# Patient Record
Sex: Female | Born: 1955 | Race: White | Hispanic: No | Marital: Married | Smoking: Former smoker
Health system: Southern US, Community
[De-identification: ages and names within clinical notes are randomized; demographics above are authoritative.]

## PROBLEM LIST (undated history)

## (undated) DIAGNOSIS — N189 Chronic kidney disease, unspecified: Secondary | ICD-10-CM

## (undated) DIAGNOSIS — I350 Nonrheumatic aortic (valve) stenosis: Secondary | ICD-10-CM

## (undated) DIAGNOSIS — I10 Essential (primary) hypertension: Secondary | ICD-10-CM

## (undated) DIAGNOSIS — R011 Cardiac murmur, unspecified: Secondary | ICD-10-CM

## (undated) DIAGNOSIS — K5732 Diverticulitis of large intestine without perforation or abscess without bleeding: Secondary | ICD-10-CM

## (undated) DIAGNOSIS — I639 Cerebral infarction, unspecified: Secondary | ICD-10-CM

## (undated) DIAGNOSIS — K219 Gastro-esophageal reflux disease without esophagitis: Secondary | ICD-10-CM

## (undated) DIAGNOSIS — C649 Malignant neoplasm of unspecified kidney, except renal pelvis: Secondary | ICD-10-CM

## (undated) DIAGNOSIS — I77810 Thoracic aortic ectasia: Secondary | ICD-10-CM

## (undated) DIAGNOSIS — R918 Other nonspecific abnormal finding of lung field: Secondary | ICD-10-CM

## (undated) DIAGNOSIS — Z952 Presence of prosthetic heart valve: Secondary | ICD-10-CM

## (undated) HISTORY — DX: Chronic kidney disease, unspecified: N18.9

## (undated) HISTORY — DX: Essential (primary) hypertension: I10

## (undated) HISTORY — PX: APPENDECTOMY: SHX54

## (undated) HISTORY — DX: Thoracic aortic ectasia: I77.810

## (undated) HISTORY — PX: OVARIAN CYST SURGERY: SHX726

## (undated) HISTORY — PX: HERNIA REPAIR: SHX51

## (undated) HISTORY — DX: Other nonspecific abnormal finding of lung field: R91.8

## (undated) HISTORY — DX: Gastro-esophageal reflux disease without esophagitis: K21.9

## (undated) HISTORY — DX: Cerebral infarction, unspecified: I63.9

## (undated) HISTORY — DX: Diverticulitis of large intestine without perforation or abscess without bleeding: K57.32

## (undated) HISTORY — PX: COLON SURGERY: SHX602

---

## 2001-12-08 ENCOUNTER — Ambulatory Visit (HOSPITAL_COMMUNITY): Admission: RE | Admit: 2001-12-08 | Discharge: 2001-12-08 | Payer: Self-pay | Admitting: Gastroenterology

## 2001-12-08 ENCOUNTER — Encounter: Payer: Self-pay | Admitting: Gastroenterology

## 2001-12-18 ENCOUNTER — Encounter: Admission: RE | Admit: 2001-12-18 | Discharge: 2001-12-18 | Payer: Self-pay | Admitting: Gastroenterology

## 2001-12-18 ENCOUNTER — Encounter: Payer: Self-pay | Admitting: Gastroenterology

## 2001-12-26 ENCOUNTER — Ambulatory Visit (HOSPITAL_COMMUNITY): Admission: RE | Admit: 2001-12-26 | Discharge: 2001-12-26 | Payer: Self-pay | Admitting: Gastroenterology

## 2002-12-04 ENCOUNTER — Encounter: Payer: Self-pay | Admitting: Emergency Medicine

## 2002-12-04 ENCOUNTER — Emergency Department (HOSPITAL_COMMUNITY): Admission: EM | Admit: 2002-12-04 | Discharge: 2002-12-05 | Payer: Self-pay | Admitting: Emergency Medicine

## 2003-05-20 ENCOUNTER — Encounter: Payer: Self-pay | Admitting: Emergency Medicine

## 2003-05-20 ENCOUNTER — Inpatient Hospital Stay (HOSPITAL_COMMUNITY): Admission: EM | Admit: 2003-05-20 | Discharge: 2003-05-28 | Payer: Self-pay | Admitting: Emergency Medicine

## 2003-05-21 ENCOUNTER — Encounter (INDEPENDENT_AMBULATORY_CARE_PROVIDER_SITE_OTHER): Payer: Self-pay

## 2003-05-22 ENCOUNTER — Encounter: Payer: Self-pay | Admitting: Surgery

## 2003-05-29 ENCOUNTER — Encounter: Payer: Self-pay | Admitting: General Surgery

## 2003-05-29 ENCOUNTER — Encounter: Payer: Self-pay | Admitting: Surgery

## 2003-05-29 ENCOUNTER — Inpatient Hospital Stay (HOSPITAL_COMMUNITY): Admission: AD | Admit: 2003-05-29 | Discharge: 2003-06-03 | Payer: Self-pay | Admitting: *Deleted

## 2004-04-14 ENCOUNTER — Emergency Department (HOSPITAL_COMMUNITY): Admission: EM | Admit: 2004-04-14 | Discharge: 2004-04-14 | Payer: Self-pay | Admitting: Emergency Medicine

## 2005-03-08 ENCOUNTER — Emergency Department (HOSPITAL_COMMUNITY): Admission: EM | Admit: 2005-03-08 | Discharge: 2005-03-08 | Payer: Self-pay | Admitting: Emergency Medicine

## 2005-05-07 ENCOUNTER — Emergency Department (HOSPITAL_COMMUNITY): Admission: EM | Admit: 2005-05-07 | Discharge: 2005-05-07 | Payer: Self-pay | Admitting: Emergency Medicine

## 2005-06-08 ENCOUNTER — Inpatient Hospital Stay (HOSPITAL_COMMUNITY): Admission: RE | Admit: 2005-06-08 | Discharge: 2005-06-09 | Payer: Self-pay | Admitting: General Surgery

## 2005-10-25 DIAGNOSIS — C649 Malignant neoplasm of unspecified kidney, except renal pelvis: Secondary | ICD-10-CM | POA: Insufficient documentation

## 2005-10-25 HISTORY — DX: Malignant neoplasm of unspecified kidney, except renal pelvis: C64.9

## 2007-07-20 ENCOUNTER — Inpatient Hospital Stay (HOSPITAL_COMMUNITY): Admission: EM | Admit: 2007-07-20 | Discharge: 2007-07-22 | Payer: Self-pay | Admitting: Emergency Medicine

## 2007-07-22 ENCOUNTER — Emergency Department (HOSPITAL_COMMUNITY): Admission: EM | Admit: 2007-07-22 | Discharge: 2007-07-23 | Payer: Self-pay | Admitting: Emergency Medicine

## 2008-11-07 ENCOUNTER — Encounter: Payer: Self-pay | Admitting: Cardiovascular Disease

## 2008-11-07 ENCOUNTER — Emergency Department (HOSPITAL_COMMUNITY): Admission: EM | Admit: 2008-11-07 | Discharge: 2008-11-07 | Payer: Self-pay | Admitting: Emergency Medicine

## 2008-11-22 ENCOUNTER — Ambulatory Visit: Payer: Self-pay | Admitting: Cardiovascular Disease

## 2008-11-27 DIAGNOSIS — R0789 Other chest pain: Secondary | ICD-10-CM | POA: Insufficient documentation

## 2008-11-27 DIAGNOSIS — R002 Palpitations: Secondary | ICD-10-CM | POA: Insufficient documentation

## 2008-11-27 DIAGNOSIS — I10 Essential (primary) hypertension: Secondary | ICD-10-CM | POA: Insufficient documentation

## 2008-11-27 DIAGNOSIS — R011 Cardiac murmur, unspecified: Secondary | ICD-10-CM | POA: Insufficient documentation

## 2008-12-05 ENCOUNTER — Ambulatory Visit: Payer: Self-pay | Admitting: Cardiovascular Disease

## 2008-12-05 ENCOUNTER — Ambulatory Visit: Payer: Self-pay

## 2008-12-05 ENCOUNTER — Encounter: Payer: Self-pay | Admitting: Cardiovascular Disease

## 2008-12-05 LAB — CONVERTED CEMR LAB: TSH: 0.7 microintl units/mL (ref 0.35–5.50)

## 2008-12-11 ENCOUNTER — Encounter: Payer: Self-pay | Admitting: Cardiovascular Disease

## 2008-12-11 ENCOUNTER — Ambulatory Visit: Payer: Self-pay | Admitting: Cardiovascular Disease

## 2008-12-11 DIAGNOSIS — C649 Malignant neoplasm of unspecified kidney, except renal pelvis: Secondary | ICD-10-CM | POA: Insufficient documentation

## 2008-12-11 LAB — CONVERTED CEMR LAB
CO2: 31 meq/L (ref 19–32)
Calcium: 9.4 mg/dL (ref 8.4–10.5)
Chloride: 100 meq/L (ref 96–112)
Creatinine, Ser: 0.6 mg/dL (ref 0.4–1.2)
Glucose, Bld: 89 mg/dL (ref 70–99)
Hgb A1c MFr Bld: 6.1 % — ABNORMAL HIGH (ref 4.6–6.0)
INR: 1 (ref 0.8–1.0)
Lymphocytes Relative: 28.6 % (ref 12.0–46.0)
Monocytes Relative: 9 % (ref 3.0–12.0)
Platelets: 261 10*3/uL (ref 150–400)
Potassium: 3.7 meq/L (ref 3.5–5.1)
Prothrombin Time: 11 s (ref 10.9–13.3)
RDW: 12.7 % (ref 11.5–14.6)
Sodium: 138 meq/L (ref 135–145)

## 2009-02-04 ENCOUNTER — Encounter: Payer: Self-pay | Admitting: Cardiovascular Disease

## 2009-02-04 ENCOUNTER — Ambulatory Visit: Payer: Self-pay | Admitting: Cardiovascular Disease

## 2009-02-05 ENCOUNTER — Telehealth: Payer: Self-pay | Admitting: Cardiovascular Disease

## 2009-02-28 ENCOUNTER — Telehealth (INDEPENDENT_AMBULATORY_CARE_PROVIDER_SITE_OTHER): Payer: Self-pay | Admitting: *Deleted

## 2009-05-07 ENCOUNTER — Ambulatory Visit: Payer: Self-pay | Admitting: Cardiovascular Disease

## 2009-09-08 ENCOUNTER — Telehealth (INDEPENDENT_AMBULATORY_CARE_PROVIDER_SITE_OTHER): Payer: Self-pay | Admitting: *Deleted

## 2009-10-21 ENCOUNTER — Inpatient Hospital Stay (HOSPITAL_COMMUNITY): Admission: EM | Admit: 2009-10-21 | Discharge: 2009-10-25 | Payer: Self-pay | Admitting: Emergency Medicine

## 2009-10-21 ENCOUNTER — Encounter: Payer: Self-pay | Admitting: Emergency Medicine

## 2009-10-21 ENCOUNTER — Ambulatory Visit: Payer: Self-pay | Admitting: Diagnostic Radiology

## 2009-11-14 ENCOUNTER — Encounter: Admission: RE | Admit: 2009-11-14 | Discharge: 2009-11-14 | Payer: Self-pay | Admitting: Surgery

## 2009-11-19 ENCOUNTER — Inpatient Hospital Stay (HOSPITAL_COMMUNITY): Admission: EM | Admit: 2009-11-19 | Discharge: 2009-11-28 | Payer: Self-pay | Admitting: Emergency Medicine

## 2009-11-24 ENCOUNTER — Encounter (INDEPENDENT_AMBULATORY_CARE_PROVIDER_SITE_OTHER): Payer: Self-pay

## 2009-12-25 ENCOUNTER — Ambulatory Visit (HOSPITAL_COMMUNITY): Admission: RE | Admit: 2009-12-25 | Discharge: 2009-12-25 | Payer: Self-pay | Admitting: Surgery

## 2011-01-10 LAB — CBC
HCT: 33.5 % — ABNORMAL LOW (ref 36.0–46.0)
HCT: 36.9 % (ref 36.0–46.0)
HCT: 37.2 % (ref 36.0–46.0)
Hemoglobin: 10.5 g/dL — ABNORMAL LOW (ref 12.0–15.0)
Hemoglobin: 11.4 g/dL — ABNORMAL LOW (ref 12.0–15.0)
Hemoglobin: 12.2 g/dL (ref 12.0–15.0)
MCHC: 32.9 g/dL (ref 30.0–36.0)
MCHC: 33.4 g/dL (ref 30.0–36.0)
MCV: 85.3 fL (ref 78.0–100.0)
MCV: 85.4 fL (ref 78.0–100.0)
MCV: 86.7 fL (ref 78.0–100.0)
Platelets: 278 10*3/uL (ref 150–400)
Platelets: 330 10*3/uL (ref 150–400)
Platelets: 342 10*3/uL (ref 150–400)
RBC: 3.69 MIL/uL — ABNORMAL LOW (ref 3.87–5.11)
RBC: 3.92 MIL/uL (ref 3.87–5.11)
RBC: 4.36 MIL/uL (ref 3.87–5.11)
RDW: 13.6 % (ref 11.5–15.5)
WBC: 4.9 10*3/uL (ref 4.0–10.5)
WBC: 5.4 10*3/uL (ref 4.0–10.5)
WBC: 5.4 10*3/uL (ref 4.0–10.5)
WBC: 8.2 10*3/uL (ref 4.0–10.5)

## 2011-01-10 LAB — LIPASE, BLOOD: Lipase: 21 U/L (ref 11–59)

## 2011-01-10 LAB — COMPREHENSIVE METABOLIC PANEL
ALT: 21 U/L (ref 0–35)
AST: 19 U/L (ref 0–37)
Alkaline Phosphatase: 77 U/L (ref 39–117)
CO2: 27 mEq/L (ref 19–32)
GFR calc Af Amer: >60 mL/min (ref >60)
Glucose, Bld: 105 mg/dL — ABNORMAL HIGH (ref 70–99)
Potassium: 3.3 mEq/L — ABNORMAL LOW (ref 3.5–5.1)
Sodium: 137 mEq/L (ref 135–145)
Total Protein: 8.4 g/dL — ABNORMAL HIGH (ref 6.0–8.3)

## 2011-01-10 LAB — URINE MICROSCOPIC-ADD ON

## 2011-01-10 LAB — URINALYSIS, ROUTINE W REFLEX MICROSCOPIC
Hgb urine dipstick: NEGATIVE
Ketones, ur: 15 mg/dL — AB
Nitrite: NEGATIVE
pH: 6 (ref 5.0–8.0)

## 2011-01-10 LAB — DIFFERENTIAL
Basophils Absolute: 0.1 10*3/uL (ref 0.0–0.1)
Basophils Relative: 1 % (ref 0–1)
Eosinophils Absolute: 0.1 10*3/uL (ref 0.0–0.7)
Eosinophils Relative: 1 % (ref 0–5)
Lymphocytes Relative: 25 % (ref 12–46)
Lymphs Abs: 2 10*3/uL (ref 0.7–4.0)
Monocytes Absolute: 0.5 10*3/uL (ref 0.1–1.0)
Monocytes Relative: 6 % (ref 3–12)
Neutro Abs: 5.5 10*3/uL (ref 1.7–7.7)
Neutrophils Relative %: 68 % (ref 43–77)

## 2011-01-10 LAB — POCT I-STAT, CHEM 8
BUN: 9 mg/dL (ref 6–23)
Creatinine, Ser: 0.6 mg/dL (ref 0.4–1.2)
Potassium: 3.4 mEq/L — ABNORMAL LOW (ref 3.5–5.1)
Sodium: 136 mEq/L (ref 135–145)

## 2011-01-10 LAB — BASIC METABOLIC PANEL
BUN: 1 mg/dL — ABNORMAL LOW (ref 6–23)
CO2: 27 mEq/L (ref 19–32)
CO2: 29 mEq/L (ref 19–32)
Calcium: 8.5 mg/dL (ref 8.4–10.5)
Chloride: 105 mEq/L (ref 96–112)
Creatinine, Ser: 0.49 mg/dL (ref 0.4–1.2)
Creatinine, Ser: 0.49 mg/dL (ref 0.4–1.2)
GFR calc Af Amer: >60 mL/min (ref >60)
GFR calc non Af Amer: >60 mL/min (ref >60)
Potassium: 3.7 mEq/L (ref 3.5–5.1)
Sodium: 138 mEq/L (ref 135–145)

## 2011-01-10 LAB — MAGNESIUM
Magnesium: 1.6 mg/dL (ref 1.5–2.5)
Magnesium: 1.8 mg/dL (ref 1.5–2.5)

## 2011-01-13 ENCOUNTER — Other Ambulatory Visit: Payer: Self-pay | Admitting: Cardiovascular Disease

## 2011-01-13 LAB — CBC
HCT: 31.8 % — ABNORMAL LOW (ref 36.0–46.0)
Hemoglobin: 10.6 g/dL — ABNORMAL LOW (ref 12.0–15.0)
MCHC: 33.3 g/dL (ref 30.0–36.0)
MCHC: 33.4 g/dL (ref 30.0–36.0)
Platelets: 281 10*3/uL (ref 150–400)
Platelets: 291 10*3/uL (ref 150–400)
RDW: 14.1 % (ref 11.5–15.5)
RDW: 14.1 % (ref 11.5–15.5)

## 2011-01-13 LAB — BASIC METABOLIC PANEL
BUN: 5 mg/dL — ABNORMAL LOW (ref 6–23)
CO2: 26 mEq/L (ref 19–32)
Calcium: 8.3 mg/dL — ABNORMAL LOW (ref 8.4–10.5)
Glucose, Bld: 119 mg/dL — ABNORMAL HIGH (ref 70–99)
Potassium: 4.1 mEq/L (ref 3.5–5.1)
Sodium: 136 mEq/L (ref 135–145)

## 2011-01-22 ENCOUNTER — Telehealth: Payer: Self-pay | Admitting: Cardiovascular Disease

## 2011-01-23 ENCOUNTER — Other Ambulatory Visit: Payer: Self-pay | Admitting: *Deleted

## 2011-01-23 MED ORDER — AMLODIPINE BESYLATE 5 MG PO TABS: 5.0000 mg | ORAL_TABLET | Freq: Every day | ORAL | Status: DC

## 2011-01-25 LAB — DIFFERENTIAL
Basophils Absolute: 0 K/uL (ref 0.0–0.1)
Basophils Relative: 0 % (ref 0–1)
Eosinophils Absolute: 0.1 10*3/uL (ref 0.0–0.7)
Eosinophils Relative: 1 % (ref 0–5)
Lymphocytes Relative: 15 % (ref 12–46)
Lymphs Abs: 1.4 10*3/uL (ref 0.7–4.0)
Monocytes Absolute: 0.5 K/uL (ref 0.1–1.0)
Monocytes Relative: 5 % (ref 3–12)
Neutro Abs: 7.6 10*3/uL (ref 1.7–7.7)
Neutrophils Relative %: 79 % — ABNORMAL HIGH (ref 43–77)

## 2011-01-25 LAB — URINALYSIS, ROUTINE W REFLEX MICROSCOPIC
Bilirubin Urine: NEGATIVE
Glucose, UA: NEGATIVE mg/dL
Hgb urine dipstick: NEGATIVE
Ketones, ur: NEGATIVE mg/dL
Nitrite: NEGATIVE
Protein, ur: NEGATIVE mg/dL
Specific Gravity, Urine: 1.02 (ref 1.005–1.030)
Urobilinogen, UA: 0.2 mg/dL (ref 0.0–1.0)
pH: 5.5 (ref 5.0–8.0)

## 2011-01-25 LAB — COMPREHENSIVE METABOLIC PANEL WITH GFR
AST: 21 U/L (ref 0–37)
Albumin: 4.5 g/dL (ref 3.5–5.2)
Alkaline Phosphatase: 108 U/L (ref 39–117)
BUN: 15 mg/dL (ref 6–23)
CO2: 29 meq/L (ref 19–32)
Chloride: 98 meq/L (ref 96–112)
Creatinine, Ser: 0.6 mg/dL (ref 0.4–1.2)
GFR calc Af Amer: >60 mL/min (ref >60)
GFR calc non Af Amer: >60 mL/min (ref >60)
Potassium: 3.7 meq/L (ref 3.5–5.1)
Total Bilirubin: 0.6 mg/dL (ref 0.3–1.2)

## 2011-01-25 LAB — COMPREHENSIVE METABOLIC PANEL
ALT: 16 U/L (ref 0–35)
ALT: 29 U/L (ref 0–35)
AST: 17 U/L (ref 0–37)
Albumin: 3.5 g/dL (ref 3.5–5.2)
Alkaline Phosphatase: 76 U/L (ref 39–117)
Calcium: 9.6 mg/dL (ref 8.4–10.5)
Chloride: 100 mEq/L (ref 96–112)
Creatinine, Ser: 0.57 mg/dL (ref 0.4–1.2)
GFR calc Af Amer: >60 mL/min (ref >60)
Glucose, Bld: 105 mg/dL — ABNORMAL HIGH (ref 70–99)
Potassium: 4 mEq/L (ref 3.5–5.1)
Sodium: 136 mEq/L (ref 135–145)
Sodium: 141 mEq/L (ref 135–145)
Total Bilirubin: 0.6 mg/dL (ref 0.3–1.2)
Total Protein: 8.4 g/dL — ABNORMAL HIGH (ref 6.0–8.3)

## 2011-01-25 LAB — CBC
HCT: 38.8 % (ref 36.0–46.0)
Hemoglobin: 12.1 g/dL (ref 12.0–15.0)
Hemoglobin: 12.9 g/dL (ref 12.0–15.0)
MCHC: 33.3 g/dL (ref 30.0–36.0)
MCV: 85.9 fL (ref 78.0–100.0)
Platelets: 274 10*3/uL (ref 150–400)
RBC: 4.21 MIL/uL (ref 3.87–5.11)
RBC: 4.52 MIL/uL (ref 3.87–5.11)
RDW: 12.6 % (ref 11.5–15.5)
WBC: 8.3 10*3/uL (ref 4.0–10.5)
WBC: 9.6 K/uL (ref 4.0–10.5)

## 2011-01-25 LAB — PROTIME-INR
INR: 0.99 (ref 0.00–1.49)
Prothrombin Time: 13 seconds (ref 11.6–15.2)

## 2011-01-25 LAB — LIPASE, BLOOD: Lipase: 51 U/L (ref 23–300)

## 2011-01-25 LAB — LACTIC ACID, PLASMA: Lactic Acid, Venous: 0.6 mmol/L (ref 0.5–2.2)

## 2011-01-25 LAB — HEMOCCULT GUIAC POC 1CARD (OFFICE): Fecal Occult Bld: NEGATIVE

## 2011-01-25 LAB — CEA: CEA: <0.5 ng/mL (ref 0.0–5.0)

## 2011-01-25 LAB — APTT: aPTT: 31 s (ref 24–37)

## 2011-02-08 LAB — BASIC METABOLIC PANEL
BUN: 14 mg/dL (ref 6–23)
Calcium: 9.1 mg/dL (ref 8.4–10.5)
GFR calc non Af Amer: >60 mL/min (ref >60)
Glucose, Bld: 104 mg/dL — ABNORMAL HIGH (ref 70–99)

## 2011-02-08 LAB — POCT CARDIAC MARKERS
CKMB, poc: 1.1 ng/mL (ref 1.0–8.0)
CKMB, poc: 1.1 ng/mL (ref 1.0–8.0)
CKMB, poc: 1.7 ng/mL (ref 1.0–8.0)
Myoglobin, poc: 55.5 ng/mL (ref 12–200)
Myoglobin, poc: 56 ng/mL (ref 12–200)
Myoglobin, poc: 62.6 ng/mL (ref 12–200)
Troponin i, poc: <0.05 ng/mL (ref 0.00–0.09)
Troponin i, poc: <0.05 ng/mL (ref 0.00–0.09)
Troponin i, poc: <0.05 ng/mL (ref 0.00–0.09)

## 2011-02-08 LAB — CBC
HCT: 35.7 % — ABNORMAL LOW (ref 36.0–46.0)
Hemoglobin: 11.8 g/dL — ABNORMAL LOW (ref 12.0–15.0)
MCHC: 33 g/dL (ref 30.0–36.0)
MCV: 86 fL (ref 78.0–100.0)
Platelets: 280 K/uL (ref 150–400)
RBC: 4.15 MIL/uL (ref 3.87–5.11)
RDW: 13.7 % (ref 11.5–15.5)
WBC: 7.5 K/uL (ref 4.0–10.5)

## 2011-02-08 LAB — DIFFERENTIAL
Basophils Absolute: 0.1 10*3/uL (ref 0.0–0.1)
Basophils Relative: 1 % (ref 0–1)
Eosinophils Absolute: 0.1 K/uL (ref 0.0–0.7)
Eosinophils Relative: 1 % (ref 0–5)
Lymphocytes Relative: 26 % (ref 12–46)
Lymphs Abs: 1.9 K/uL (ref 0.7–4.0)
Monocytes Absolute: 0.5 K/uL (ref 0.1–1.0)
Monocytes Relative: 7 % (ref 3–12)
Neutro Abs: 4.8 10*3/uL (ref 1.7–7.7)
Neutrophils Relative %: 65 % (ref 43–77)

## 2011-02-08 LAB — BASIC METABOLIC PANEL WITH GFR
CO2: 26 meq/L (ref 19–32)
Chloride: 104 meq/L (ref 96–112)
Creatinine, Ser: 0.64 mg/dL (ref 0.4–1.2)
GFR calc Af Amer: >60 mL/min (ref >60)
Potassium: 3.6 meq/L (ref 3.5–5.1)
Sodium: 138 meq/L (ref 135–145)

## 2011-03-09 NOTE — Assessment & Plan Note (Signed)
[facility]                            CARDIOLOGY OFFICE NOTE   NAME:[name]                        MRN:          [id number]  DATE:11/22/2008                            DOB:          1956/01/07    [name] is a 55 year old patient referred by the [facility] for  chest pain and palpitations.  The patient was recently in the ER on  November 03, 2008.  She is actually the mother of one of my brother's  friends.  Her son actually lives around the corner for me.  The patient  was sitting down having dinner, when she had severe substernal chest  pain, radiated up into her throat.  She subsequently went to the  emergency room.  She was worked up and sent home with negative enzymes.   The patient has had a history of diverticulitis and some chronic  epigastric pain.  She takes Nexium for this.   Associated with the pain was significant palpitations.  The patient felt  her heart racing.  It was somewhat sudden onset.  It was associated with  mild shortness of breath, but no diaphoresis.  It lasted for less than  an hour and subsided.   I do not see that the patient had thyroid studies checked when in the  emergency room.   In general, the patient is active.  She works for the [employer].  She  has not had previously documented coronary artery disease.  About 8  years ago, she said she had an ultrasound and stress test which was  normal.  Her coronary risk factors include borderline hypertension, not  on therapy.  I do not have a cholesterol status on her, but she is a  nondiabetic.   FAMILY HISTORY:  Negative.   PAST MEDICAL HISTORY:  The patient's past medical history is otherwise  remarkable for diverticulitis; question small bowel obstruction; history  of reflux and GERD, on Nexium; and borderline hypertension.   The patient is allergic to CODEINE and BENADRYL.   The only medicine she takes regularly is Nexium 40 a day.  She takes  p.r.n. Aleve.   The patient is married.  Her husband actually works nights at the [employer].  Her only son, [name], lives off [address] near  my own residence.  He has 2 children, 2 and 5, and [name] enjoys playing  with her grandkids.  She does not smoke and drinks on occasion.   PHYSICAL EXAMINATION:  GENERAL:  Remarkable for a thin female in no  distress.  VITAL SIGNS:  Pulse is little elevated at 90, blood pressure is 128/90,  respiratory rate 14, and afebrile.  Weight 171.  HEENT:  Unremarkable.  NECK:  Carotids are normal without bruit.  No lymphadenopathy,  thyromegaly, or JVP elevation.  LUNGS:  Clear.  Good diaphragmatic motion.  No wheezing.  CARDIAC:  S1 and S2 with systolic ejection murmur.  PMI normal.  ABDOMEN:  Benign.  Bowel sounds positive.  No AAA.  No tenderness.  No  bruit.  No hepatosplenomegaly.  No hepatojugular reflux.  EXTREMITIES:  Distal pulses are intact.  No edema.  NEURO:  Nonfocal.  SKIN:  Warm and dry.  MUSCULOSKELETAL:  No muscular weakness.   EKG shows sinus rhythm at a rate of 92.   IMPRESSION:  1. Atypical pain.  Followup stress Myoview.  2. Palpitations, may have had a short bursts of supraventricular      tachycardia.  She has borderline diastolic elevation in her blood      pressure and relative tachycardia.  We will check a TSH and T4.  We      will start her on low-dose Toprol at 25 mg a day and see how she      feels on it.  If she has any further episodes of palpitations, we      will give her an event monitor.  3. Borderline hypertension.  I continue to follow.  I will encourage      her to get a blood pressure monitor at home.  I will see how her      blood pressure responds to low-dose Toprol.  4. Systolic ejection murmur.  Check 2-D echocardiogram, likely aortic      sclerosis.   I will see her back after these tests in 3-4 weeks to further assess her  pulse, blood pressure, and symptoms.     Noralyn Pick. Eden Emms,  MD, [facility]  Electronically Signed    PCN/MedQ  DD: 11/22/2008  DT: 11/22/2008  Job #: [id number]

## 2011-03-09 NOTE — H&P (Signed)
[name], [name] NO.:  [id number]   MEDICAL RECORD NO.:  [id number]          PATIENT TYPE:  INP   LOCATION:  0103                         FACILITY:  [facility]   PHYSICIAN:  Mick Sell, MD DATE OF BIRTH:  04/19/1956   DATE OF ADMISSION:  07/20/2007  DATE OF DISCHARGE:                              HISTORY & PHYSICAL   Primary physician is [facility], Dr. Sheffield Slider.   CHIEF COMPLAINT:  Abdominal pain, nausea, vomiting.   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 diff.  She presents to the ER at  [facility] with complaints of 3 days of abdominal pain  associated initially with projectile vomiting and profuse watery  diarrhea approximately 3 days ago.  At that time, she also had diffuse  diaphoresis.  She said she had low-grade temps to 99.7 and felt very  poor.  Two days prior to admission, she was able to go to work and the  vomiting ceased.  She continued to have some diarrhea until this morning  but has not had a bowel movement since this morning.  Today the  abdominal pain worsened and she presented to the ER.  She was at work at  the time the pain worsened.  She, again, has not had further vomiting  since her initial day of illness and her diarrhea has eased off.  Her  last bowel movement was this morning, which was a small, loose amount  but no blood.  She is passing flatus.  She describes the pain as  midline, radiating to the left, sharp pain.  She denies any dysuria or  hematuria.  She is postmenopausal and has no vaginal discharge or  problems with her periods.  She says that for the last  several months  she has had similar symptoms once a month, including some diarrhea and  crampy belly pain but nothing as severe as this.  In the ER, she  received a dose of morphine.  She had an x-ray, which showed possible  small bowel obstruction versus ileus and was seen by general surgery on  call, Dr. Wenda Low with [facility].  Dr. Daphine Deutscher, after  reviewing the x-rays and seeing and evaluating her, felt she did not  have a surgical abdomen, did not have symptoms consistent with small  bowel obstruction but rather with gastroenteritis and an ileus.   PAST MEDICAL HISTORY:  1. Appendectomy 3 years ago.  2. Status post removal of a left ovarian cyst at the time of her      appendectomy 3 years ago.  3. Surgical midline hernia, incisional hernia repair 2 years ago.  4. History of tumor on her kidney status post nephrectomy 2  years ago.  5. Prior history of C. diff.  Of note, her tumor removal on her nephrectomy was done at [facility] and  she said she last had a CT scan done at [facility] in August, which was  within normal limits, she says.   PAST SURGICAL HISTORY:  As above.   MEDICATIONS:  Tylenol p.r.n.   ALLERGIES:  SHE SAYS SHE IS ALLERGIC TO CODEINE AND BENADRYL, WHICH  CAUSE HER TO HAVE ALTERED MENTAL STATUS.   SOCIAL HISTORY:  She works as a Museum/gallery curator.  She is married, has some  children and grandchildren.  No sick contacts that she reports.  She  does not smoke and only drinks alcohol rarely.  She has not consumed any  unusual foods for her and her husband ate all the same things she had  over the last several days.  She has not traveled recently.   FAMILY HISTORY:  Father had lung cancer.  Her mother had breast cancer  and died recently.   REVIEW OF SYSTEMS:  Eleven systems reviewed and are negative except for  as HPI.   PHYSICAL EXAMINATION:  VITAL SIGNS:  The patient today has a temperature  of 99.1, pulse 76, blood pressure 123/67 up to 155/83,  respirations 17-  20, sat 98-99% on room air.  GENERAL:  She is a pleasant middle aged female in no acute distress.  HEENT:  Pupils equal, round and reactive to light and accommodation.  Extraocular movements intact.  Anicteric sclerae.  Conjunctivae are  mildly injected.  Oropharynx is clear.  Moist mucous membranes.  No oral  lesions.  Dentition is within normal limits.  No anterior cervical,  posterior cervical or supraclavicular lymphadenopathy.  HEART:  Regular rate and rhythm.  LUNGS:  Clear to auscultation bilaterally.  ABDOMEN:  Mildly distended and tympanic.  She does have some decreased  bowel sounds noted.  She is diffusely tender to palpation but most  notably in the midline left lower quadrant.  She has no rebound or  guarding.  EXTREMITIES:  No clubbing, cyanosis or edema.  NEUROLOGIC:  Alert and oriented x3 with a grossly nonfocal neuro exam.   DATA:  The patient had blood work done with the most recent white count  of 12, hemoglobin 12.2, platelets 308.  Sodium 136, K 4, chloride 98,  bicarb 30, BUN 11, creatinine 0.6, glucose 108.  UA negative except for small blood and trace leukocyte esterase.  LFTs within normal limits.  X-ray of her abdomen showed earlier partial small bowel obstruction, no  free intraperitoneal air, per read of the radiologist.  Chest x-ray is clear.   ASSESSMENT:  This is a pleasant 55 year old female with some 3 past  abdominal surgeries who presents with a 3-day history of abdominal pain,  initially with projectile vomiting and profuse diarrhea.  On exam and on  x-ray, she appears to probably have an ileus.  She has been seen by  surgery.  Differential for this abdominal pain and probably associated  ileus would be gastroenteritis, diverticulitis, small bowel obstruction  or large bowel obstruction due to adhesions from prior surgery or  colitis.  She also has a mildly elevated white count and has had low-  grade temps.   PLAN:  1. Will  admit her to medicine for observation.  Surgery will need to      follow her abdominal exam.  2. Will discontinue morphine and follow her clinically, treating her      with Tylenol only  so we can better monitor her pain.  3. I will repeat an abdominal film in the a.m.  If there is worsening      or no improvement and/or her symptoms worsen, we will move to a CT      of her abdomen with p.o. and IV contrast.  4. I will check blood cultures x2 given the history of some      diaphoresis and also mildly elevated white count.  I will also      check a C. diff given her prior history and a stool sample, though      I expect this to be low yield.  5. FEN.  Will place her on IV fluids, normal saline 100 mL an hour.      Will keep her NPO except for ice chips.  6. ID.  We will not cover her with antibiotics at this time; however,      if she spikes a fever, she would benefit from imaging of her belly      to rule out an abscess and empiric antibiotic coverage with Zosyn      or Unasyn for intra-abdominal pathogens.  7. Prophylaxis.  The patient will be placed on SCDs for DVT      prophylaxis.  Place her on Prilosec as well for GI prophylaxis.  8. Disposition.  The patient will be discharged home when medically      stable.      Mick Sell, MD  Electronically Signed     DPF/MEDQ  D:  07/20/2007  T:  07/20/2007  Job:  [id number]

## 2011-03-09 NOTE — Consult Note (Signed)
NAMEMARYPAT, KIMMET NO.:  [id number]   MEDICAL RECORD NO.:  [id number]          PATIENT TYPE:  EMS   LOCATION:  ED                           FACILITY:  [facility]   PHYSICIAN:  Thornton Park. Daphine Deutscher, MD  DATE OF BIRTH:  Aug 30, 1956   DATE OF CONSULTATION:  07/19/2007  DATE OF DISCHARGE:                                 CONSULTATION   CHIEF COMPLAINT:  Nausea, vomiting, diarrhea, and abdominal pain since  Monday (2 days ago).   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  She  went to work on Monday despite having some crampy abdominal pain,  nausea, vomiting, and that night had a lot of diarrhea.  She treated  this with Imodium and then went to work the next day.  Again continued  to have nausea with more diarrhea.  The diarrhea has abated with  Imodium, but she is still having abdominal pain, nausea and passing a  lot of flatus.   PAST MEDICAL HISTORY:  The past medical history is remarkable in that  she is had:  1. A previous appendectomy.  2. She has had a left partial nephrectomy for kidney cancer.   ALLERGIES:  BENADRYL and CODEINE.   MEDICATIONS:  Only medication is Tylenol.   SOCIAL HISTORY:  She does not take drugs.  She is a nonsmoker.  Occasional drinker.   PHYSICAL EXAMINATION:  VITAL SIGNS:  Temperature 99.  Blood pressure  123/67, pulse 76, respirations 20.  HEENT:  Unremarkable.  ABDOMEN:  Mildly protuberant but not obese.  There is no rebound or  guarding.  She has a lower midline incision and she has a flank incision  on the left from her in nephrectomy.  She has no rebound or guarding.  She does have a few bowel sounds.  No palpable hernias are noted.   X-rays were evaluated and I disagree with the reading by the radiologist  in that there is gas in her descending colon.  This is consistent with a  history of passing flatus.  I think this is more of an ileus pattern  rather than partial small bowel obstruction pattern.   LABORATORY DATA:  White count 12,000.  Hemoglobin is 12.2.  Electrolytes  are within normal limits and on urinalysis, she has small amount of  hemoglobin in her urine. Leukocyte esterase was trace.   DISCUSSION:  Her history and physical findings suggest a  gastroenteritis.  I would recommend that she seen by her medical doctor  which is [facility] with [facility], possibly for admission  for IV hydration and observation. At the present time she has no  surgical abdomen for which I have recommended a surgical intervention.      Thornton Park Daphine Deutscher, MD  Electronically Signed     MBM/MEDQ  D:  07/19/2007  T:  07/20/2007  Job:  [id number]

## 2011-03-09 NOTE — Consult Note (Signed)
NAMEBRODY, [name]                 ACCOUNT NO.:  [id number]   MEDICAL RECORD NO.:  [id number]          PATIENT TYPE:  INP   LOCATION:  1540                         FACILITY:  [facility]   PHYSICIAN:  [name], M.D.    DATE OF BIRTH:  [birth year]   DATE OF CONSULTATION:  07/21/2007  DATE OF DISCHARGE:                                 CONSULTATION   GI CONSULTATION   We are asked to see [name] today by Dr. Donnalee Curry in  consultation for possible small bowel obstruction and abnormality on CT  scan.   HISTORY OF PRESENT ILLNESS:  This is a 55 year old female who started  vomiting Sunday night rather suddenly.  She says she has experienced no  hematemesis.  The following day she has profuse watery diarrhea and  cramping, now she is better after approximately 48 hours of bowel rest.  She is currently having no abdominal pain, but just some soreness and  her diarrhea has stopped. The patient reports that her niece had a viral  stomach bug approximately 3 weeks ago.  She says that she also gets  occasional diarrhea when she is eating greasy food but has never  experienced symptoms like this.  She has never had any melena,  hematochezia, or hematemesis.  She did have an upper endoscopy  approximately 3 years ago by Dr. Charna Elizabeth who diagnosed her with  gastroesophageal reflux disease.  She has been doing well after taking a  course of Nexium.  She has no history of IBD or diverticulitis.   PAST MEDICAL HISTORY:  Is significant for a ruptured gangrenous appendix  which was removed 3 years ago.  She at that time also had an  oophorectomy.  She had a complex hernia repair with mesh placement  approximately 2 years ago.  She had a tumor removed from her kidney  approximately 2 years ago.  She has a history of C diff colitis as well  as gastroesophageal reflux disease.   CURRENT MEDICATIONS AT HOME:  Only Tylenol.  She says she does not take  any NSAIDs.   ALLERGIES:  Include  CODEINE and BENADRYL.   REVIEW OF SYSTEMS:  Is significant for low grade fever.   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  Denies any tobacco or drug use.   FAMILY HISTORY:  Is negative for IBD or diverticulitis.  She does report  that her mother had diverticulosis.   PHYSICAL EXAM:  She is alert and oriented in no apparent distress.  She  is very pleasant to speak with.  Her temperature is 97.9, her pulse is  83, her respirations were 16.  Her blood pressure is 114/72.  HEART has a regular rate and rhythm without murmurs, rubs or gallops.  LUNGS:  Clear to auscultation without any wheezes or crackles  ABDOMEN:  Soft, nontender, nondistended with good bowel sounds.   LABS TODAY:  Show a sodium 139, potassium 3.6, chloride 106, bicarb 26,  BUN four, creatinine 0.58, glucose 95.  LFTs, coags were both  within  normal limits.  Her urinary analysis done a couple days ago showed small  amount of blood as well as small leukocyte esterase.  Her last CBC was  done yesterday on 09/25, showed a white count of 6.7, hemoglobin 11.2,  hematocrit 33.1, platelets 277.  CT of her abdomen done yesterday  September 25 shows thickening of the wall of her sigmoid colon with  inflammatory changes in the neighboring bowel loops.  Also in her small  bowel she had a ileus first obstruction.  Abdominal x-ray done today  shows partial obstruction.   ASSESSMENT:  The patient has been seen by Dr. Leary Roca who has  examined her, collected a history and reviewed the chart.  His  impression is that this is a 55 year old female who has experienced a  possible bout of  gastroenteritis but we are concerned regarding her  abnormalities on CT.  Agree with current bowel rest and advancing diet  to clears.  Doubt diverticulitis in this case, consider small bowel  follow-through.  Will need outpatient colonoscopy in a few weeks when  this acute episode has resolved.  We  will follow along with you.  Thank  you very much for this consultation.      Tiffany Police, PA    ______________________________  [name], M.D.    MLY/MEDQ  D:  07/21/2007  T:  07/22/2007  Job:  [id number]   cc:   [name], M.D.   [name], M.D.

## 2011-03-12 NOTE — Discharge Summary (Signed)
   NAMEADILENY, [name]                             ACCOUNT NO.:  [id number]   MEDICAL RECORD NO.:  [id number]                   PATIENT TYPE:  INP   LOCATION:  9307                                 FACILITY:  [facility]   PHYSICIAN:  Abigail Miyamoto, M.D.              DATE OF BIRTH:  1956-10-01   DATE OF ADMISSION:  05/29/2003  DATE OF DISCHARGE:  06/03/2003                                 DISCHARGE SUMMARY   HISTORY OF PRESENT ILLNESS:  Ms. [name] is a 55 year old female who I  had operated on for a perforated severe appendicitis.  She was discharged  home on May 28, 2003, but after going home developed severe diarrhea.  She  underwent a CAT scan of the abdomen and pelvis to rule out an abscess and  she was indeed found to have actually colitis.  C. difficile was performed  which confirmed C. difficile colitis.  Therefore, she was admitted to [facility] to their intensive care unit for further care.  The patient  requested admission to [facility].   HOSPITAL COURSE:  The patient was admitted and found out to be febrile.  She  had a distended, mildly tender abdomen.  She was also found to have a white  blood cell count of 40,000.  She was placed on Flagyl and vancomycin for IV  purposes.  During her hospitalization, her diarrhea slowly decreased over  several days and she was quickly rehydrated.  By June 10, 2003, her white  count had decreased to 23,000.  Her potassium was normal and her creatinine  was normal as well.  She was transferred to the floor and started on a diet.  Over the next several days, she continued to improve.  By June 03, 2003,  she was only having two to three bowel movements a day.  She was tolerating  a diet.  Her abdomen was soft and nontender.  Her incision was healing well  and the plan at this time was to continue her oral vancomycin at home.   DISCHARGE DIAGNOSIS:  Clostridium difficile colitis, status post  appendectomy.   DISCHARGE MEDICATIONS:  1. Vancomycin 125 mg q.i.d. x 10 days.  2. Resume home medications.   ACTIVITY:  No heavy lifting greater than 20 pounds x6 weeks.   DIET:  No dietary restrictions.   SPECIAL INSTRUCTIONS:  She may shower.   FOLLOW UP:  She will follow up in my office one week post discharge.                                                Abigail Miyamoto, M.D.    DB/MEDQ  D:  07/05/2003  T:  07/07/2003  Job:  [id number]

## 2011-03-12 NOTE — H&P (Signed)
[name], INSCOE NO.:  [id number]   MEDICAL RECORD NO.:  [id number]                   PATIENT TYPE:  EMS   LOCATION:  ED                                   [facility]AC[facility]  [facility]   PHYSICIAN:  Abigail Miyamoto, M.D.              DATE OF BIRTH:  1956-03-26   DATE OF ADMISSION:  05/20/2003  DATE OF DISCHARGE:                                HISTORY & PHYSICAL   CHIEF COMPLAINT:  Abdominal pain.   HISTORY:  This is a 55 year old female who developed lower abdominal pain  yesterday.  She also developed nausea, vomiting, and diarrhea with this.  She has had multiple episodes of emesis and diarrhea, but has had no blood  in either.  She has had some fevers and chills.  She has had no chest pain  or shortness of breath.  She denies any jaundice.  She has had no previous  history of similar abdominal pain.   PAST MEDICAL HISTORY:  Negative.   PAST SURGICAL HISTORY:  Negative.   MEDICATIONS:  None.   ALLERGIES:  CODEINE.   SOCIAL HISTORY:  She works as a Museum/gallery curator.  She smokes.  She drinks  alcohol rarely.   FAMILY HISTORY:  Otherwise unremarkable.   REVIEW OF SYSTEMS:  As above.   PHYSICAL EXAMINATION:  GENERAL:  A thin female in acute discomfort.  VITAL SIGNS:  Temperature is 102.4, pulse 117, blood pressure 102/69,  respiratory rate 18.  HEENT:  Eyes - anicteric.  Pupils reactive bilaterally.  Ears, nose, mouth,  and throat - her external ears and nose are normal.  Hearing is normal.  Oropharynx is clear.  NECK:  Supple.  There is no cervical adenopathy.  There is no thyromegaly.  LUNGS:  Clear to auscultation bilaterally with normal effort.  CARDIOVASCULAR:  Tachycardic with regular rhythm.  There are no murmurs.  There is no peripheral edema.  ABDOMEN:  Rigid and diffusely tender with greatest tenderness in the lower  abdomen.  There are no masses.  There is diffuse guarding.  There is no  organomegaly.  There are no hernias.  EXTREMITIES:  Warm and well perfused.   LABORATORY DATA:  The patient has a white blood cell count of 24.0,  hemoglobin 13.5, hematocrit 39.1, platelets of 246.  Neutrophils are 92.  Electrolytes show a BUN and creatinine of 6 and 0.7, lipase is 12.  Liver  function tests are normal.  Urinalysis is normal.  A CT scan of the abdomen  and pelvis shows diffuse inflammatory changes throughout the pelvis with  free fluid.  There is a question of contrast extravasation into the pelvis.  There is thickening of the cecum.  There is also thickening of the sigmoid  colon.   ASSESSMENT AND PLAN:  This is a patient with an acute abdomen.  Findings on  CT scan on physical examination are worrisome for  ruptured diverticulitis.  At this point, the plan is to go to the operating room for  exploratory laparotomy.  I discussed the risks with the patient, including  the risks of bleeding, infection, need for bowel resection, ostomy, etc.,  and at this point she wishes to proceed.  The surgery will thus be performed  emergently.                                               Abigail Miyamoto, M.D.    DB/MEDQ  D:  05/20/2003  T:  05/21/2003  Job:  [id number]

## 2011-03-12 NOTE — Discharge Summary (Signed)
[name], Tiffany                             ACCOUNT NO.:  [id number]   MEDICAL RECORD NO.:  [id number]                   PATIENT TYPE:  INP   LOCATION:  0340                                 FACILITY:  [facility]   PHYSICIAN:  Abigail Miyamoto, M.D.              DATE OF BIRTH:  [birth year]   DATE OF ADMISSION:  05/20/2003  DATE OF DISCHARGE:  05/28/2003                                 DISCHARGE SUMMARY   DISCHARGE DIAGNOSES:  1. Perforated appendicitis, status post exploratory laparotomy with     appendectomy.  2. A large follicular cyst of the ovary, status post right salpingo-     oophorectomy.   OTHER DIAGNOSIS:  Hypokalemia.   SUMMARY OF HISTORY:  [name] is a pleasant 55 year old female who  presented with nausea, vomiting, and diffuse abdominal pain.  On  examination, she was found to have a rigid, diffusely tender abdomen.  She  had a CT scan of her abdomen which showed diffuse inflammatory changes  throughout the pelvis and free fluid with a question of extravasation of  contrast in the pelvis.  She has had thickening of the cecum and thickening  of the sigmoid colon.  Secondary to this, the decision was made to proceed  to the operating room for emergent exploration.   HOSPITAL COURSE:  The patient was admitted and taken to the operating room  where she underwent exploratory laparotomy, and at this time was found to  have a ruptured appendix, as well as a right enlarged ovary.  At this point,  she underwent appendectomy, as well as removal of the ovary.  Postoperatively, she was taken to the regular surgical floor and underwent  IV antibiotics and routine postoperative care.  On postoperative day #1, her  WBC was elevated at 23.7, her hemoglobin was stable at 16.5.  At this point,  she was started on clear liquids.  Pathology on the ovary revealed only a  large benign ovarian cyst.  Over the next several days, her WBC improved.  She did have some low oxygen saturations,  but a chest x-ray was  unremarkable, except for mild atelectasis.  Her ileus slowly resolved, and  pulmonary toilet was instituted.  Her diet was advanced, and her  temperatures continued to decrease.  She did have some incisional pain and  hypokalemia which was corrected.  On postoperative day #6, she was having  bowel movements.  Her wound still showed some mild erythema, and she was  continuing to do well.  Because of some open areas on her wound, home health  care was ordered for wound checks.  By May 28, 2003, she was doing better,  had minimal erythema of the incision, was tolerating a regular diet and oral  pain medications, and given this, the decision was made to discharge the  patient to home.   DISCHARGE MEDICATIONS:  She will take Percocet and Advil  for pain.  She will  resume her home medications.  She will take Keflex 500 mg q.i.d.   DISCHARGE ACTIVITY:  She should do no heavy lifting greater than 20 pounds  for at least five more weeks.   WOUND CARE:  She may shower.  Home health has arranged for wound checks.   DISCHARGE FOLLOW UP:  She will follow up in my office in three days post-  discharge.                                               Abigail Miyamoto, M.D.   DB/MEDQ  D:  06/07/2003  T:  06/07/2003  Job:  [id number]

## 2011-03-12 NOTE — Procedures (Signed)
[place]  Patient:    ANALYNN, DAUM Visit Number: [id number] MRN: [id number]          Service Type: END Location: ENDO Attending Physician:  Charna Elizabeth Dictated by:   Anselmo Rod, M.D. Proc. Date: 12/26/01 Admit Date:  12/26/2001   CC:         Lonzo Cloud, M.D., [facility], [address] [facility] [address]   Procedure Report  DATE OF BIRTH:  [birth year].  PROCEDURE:  Esophagogastroduodenoscopy.  ENDOSCOPIST:  Anselmo Rod, M.D.  INSTRUMENT USED:  Olympus video panendoscope.  INDICATION FOR PROCEDURE:  Epigastric pain, retrosternal discomfort, and nausea in a 55 year old white female with a normal-appearing gallbladder on ultrasound and HIDA scan, and CT scan showed no acute abnormalities either. Rule out peptic ulcer disease, esophagitis, gastritis, etc.  PREPROCEDURE PREPARATION:  Informed consent was procured from the patient. The patient was fasted for eight hours prior to the procedure.  PREPROCEDURE PHYSICAL:  VITAL SIGNS:  The patient had stable vital signs.  NECK:  Supple.  CHEST:  Clear to auscultation.  S1, S2 regular.  ABDOMEN:  Soft with normal bowel sounds.  DESCRIPTION OF PROCEDURE:  The patient was placed in the left lateral decubitus position and sedated with 70 mg of Demerol and 7 mg of Versed intravenously.  Once the patient was adequately sedate and maintained on low-flow oxygen and continuous cardiac monitoring, the Olympus video panendoscope was advanced through the mouthpiece, over the tongue, into the esophagus under direct vision.  The entire esophagus appeared normal and without lesions.  The gastric mucosa and the proximal small bowel appeared normal as well.  IMPRESSION:  Normal EGD.  RECOMMENDATIONS: 1. Try Zelnorm 6 mg b.i.d. 2. Outpatient follow-up in the next two weeks. Dictated by:   Anselmo Rod, M.D. Attending Physician:  Charna Elizabeth DD:  12/26/01 TD:  12/26/01 Job:  [id number]

## 2011-03-12 NOTE — Op Note (Signed)
NAMEIVANA, [name]                 ACCOUNT NO.:  [id number]   MEDICAL RECORD NO.:  [id number]          PATIENT TYPE:  AMB   LOCATION:  DAY                          FACILITY:  [facility]   PHYSICIAN:  Gita Kudo, M.D. DATE OF BIRTH:  08-04-1956   DATE OF PROCEDURE:  06/07/2005  DATE OF DISCHARGE:                                 OPERATIVE REPORT   OPERATIVE PROCEDURE:  Repair incisional hernia with Bard composite mesh -  medium oval.   SURGEON:  Gita Kudo, M.D.   ANESTHESIA:  General endotracheal.   PREOPERATIVE DIAGNOSIS:  Incisional hernia.   POSTOPERATIVE DIAGNOSIS:  Incisional hernia.   CLINICAL SUMMARY:  This 55 year old female had a ruptured appendicitis  treated by Dr. Carman Ching 2-3 years ago. She developed a bulge. 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 It has become large and  symptomatic.   OPERATIVE FINDINGS:  The patient had a large hernia. The defect measured  approximately 10 x 6 cm. There was no adherent bowel although there were  loops of omentum stuck to the hernia sac.   OPERATIVE PROCEDURE:  Under satisfactory general endotracheal anesthesia,  having received 1.0 gram Ancef preop, the patient's abdomen was prepped and  draped in the standard fashion. The previous wide scar was excised and then  the cautery used to develop the anterior abdominal wall all around the  fascial defect. It was evident that the defect was quite large and therefore  we entered the hernia sac and excised it. The adhesions to the omentum were  lysed and the omentum returned to the abdomen. Bleeding was controlled by  cautery and Vicryl ties. When I was satisfied that I had good margins all  around, I elected to use a piece of mesh in the peritoneum. We selected the  medium oval of Bard mesh. It was oriented correctly with the mesh side up  and the smooth side down. It was then tacked under slight tension in each  quadrant with a #0 Prolene that went through  the full-thickness of the  abdominal fascia and peritoneum through the upper portion of the mesh and  then back up through the fascia. Then two sutures were used between each of  the others making a total of 12 sutures. This held the mesh up against the  abdominal wall without any gaps and in good position. Following this, the  thinned out fascia and scar was approximated with running #1 PDS and  intermittent #0 Prolene figure-of-eight sutures that took bites of the mesh,  further holding it up to the abdominal wall. The wound was then infiltrated  with 30 mL of 0.5% Marcaine for postop analgesia. A large  Blake drain was placed and let out inferiorly. The wound closed in layers  with 2-0 Vicryl followed by staples for skin. A sterile absorbent dressing  was applied. She tolerated the procedure well and went to the recovery room  in good condition.       MRL/MEDQ  D:  06/07/2005  T:  06/07/2005  Job:  [id number]  cc:   Dellis Anes. Idell Pickles, M.D.  [address]  [place]  [place] [address]  Fax: [phone]

## 2011-03-12 NOTE — Op Note (Signed)
[name], [name]                             ACCOUNT NO.:  [id number]   MEDICAL RECORD NO.:  [id number]                   PATIENT TYPE:  INP   LOCATION:  0340                                 FACILITY:  [facility]   PHYSICIAN:  Abigail Miyamoto, M.D.              DATE OF BIRTH:  05-25-1956   DATE OF PROCEDURE:  05/21/2003  DATE OF DISCHARGE:                                 OPERATIVE REPORT   PREOPERATIVE DIAGNOSIS:  Acute abdomen.   POSTOPERATIVE DIAGNOSES:  1. Acute abdomen.  2. Perforated appendix.  3. Enlarged right ovary.   PROCEDURE:  1. Exploratory laparotomy.  2. Appendectomy.  3. Right salpingo-oophorectomy.   SURGEON:  Abigail Miyamoto, M.D.   ASSISTANT:  Gita Kudo, M.D.   ANESTHESIA:  General endotracheal anesthesia.   ESTIMATED BLOOD LOSS:  Minimal.   INDICATIONS FOR PROCEDURE:  [name] is a 55 year old female who presented  with acute abdominal pain, fever, white count elevated to 24,000 and acute  abdomen on examination. CAT scan of the abdomen and pelvis showed a large  amount of intraabdominal fluid and inflammation in the pelvis without  certain source. Therefore a decision was made to proceed to the operating  room for exploration.   FINDINGS:  The patient was found to have perforated appendix. Her right  ovary was at least three times the normal size and was solid in appearance  therefore it was removed along with its tube.   DESCRIPTION OF PROCEDURE:  The patient was brought to the operating room,  identified as [name]. She was placed supine on the operating room table  and general anesthesia was induced. Her abdomen was then prepped and draped  in the usual sterile fashion. Using a #10 blade, a midline incision was then  created in the lower abdomen. The incision was carried down through the  fascia with electrocautery. The peritoneum was then opened entirely through  the incision. Upon entering the abdomen, the patient was found to  have  turbid fluid, cultures were obtained of this fluid. Further exploration  found a ruptured appendix stuck to the midline of the retroperitoneum. This  was freed up bluntly and the appendix was brought onto the field and out of  the incision along with a very large cecum. Further exploration revealed the  left colon to be edematous but no areas of perforation were identified. This  was felt to be secondary to the ruptured appendix. The patient's fallopian  tubes and uterus were also examined. The left ovary was normal in  appearance, the right ovary was 3-4 times larger than the left and appeared  solid. Secondary to this, a decision was made to remove this ovary. The  mesoappendix was first taken down with clamps and 2-0 silk ties after the  base of the appendix was transected with a single firing of the 55 GIA  stapler. Once the mesoappendix was  taken down, the appendix was entirely  removed from the field. The abdomen was then copiously irrigated with  several loops of normal saline. The ovary was again examined and at this  point the tube and salpinx were taken down with Kelly clamps and 2-0 silk  ties as well removing the entire tube and ovary which were sent to pathology  for identification. Again the abdomen was irrigated in all four quadrants  with several liters of normal saline. No other pathology was identified. The  midline fascia was then closed with a running #1 looped PDS suture. The skin  was then irrigated and partly closed with staples and Telfa wicks were  placed intermittently through the incision for drainage. A dry gauze was  placed over this. The patient tolerated the procedure well. All sponge,  needle and instrument counts were correct at the end of the procedure. The  patient was then extubated in the operating room and taken in a guarded  condition to the recovery room.                                               Abigail Miyamoto, M.D.    DB/MEDQ  D:   05/21/2003  T:  05/21/2003  Job:  [id number]

## 2011-03-15 ENCOUNTER — Encounter (HOSPITAL_COMMUNITY)
Admission: RE | Admit: 2011-03-15 | Discharge: 2011-03-15 | Disposition: A | Discharge status: Home / Self Care | Payer: Federal, State, Local not specified - PPO | Source: Ambulatory Visit | Attending: Surgery | Admitting: Surgery

## 2011-03-15 ENCOUNTER — Other Ambulatory Visit (INDEPENDENT_AMBULATORY_CARE_PROVIDER_SITE_OTHER): Payer: Self-pay | Admitting: Surgery

## 2011-03-15 ENCOUNTER — Ambulatory Visit (HOSPITAL_COMMUNITY)
Admission: RE | Admit: 2011-03-15 | Discharge: 2011-03-15 | Disposition: A | Discharge status: Home / Self Care | Payer: Federal, State, Local not specified - PPO | Source: Ambulatory Visit | Attending: Surgery | Admitting: Surgery

## 2011-03-15 DIAGNOSIS — Z01818 Encounter for other preprocedural examination: Secondary | ICD-10-CM | POA: Insufficient documentation

## 2011-03-15 DIAGNOSIS — Z01812 Encounter for preprocedural laboratory examination: Secondary | ICD-10-CM | POA: Insufficient documentation

## 2011-03-15 DIAGNOSIS — Z01810 Encounter for preprocedural cardiovascular examination: Secondary | ICD-10-CM | POA: Insufficient documentation

## 2011-03-15 DIAGNOSIS — R52 Pain, unspecified: Secondary | ICD-10-CM

## 2011-03-15 LAB — BASIC METABOLIC PANEL
Chloride: 102 mEq/L (ref 96–112)
Creatinine, Ser: 0.52 mg/dL (ref 0.4–1.2)
GFR calc Af Amer: >60 mL/min (ref >60)
Potassium: 5.1 mEq/L (ref 3.5–5.1)
Sodium: 140 mEq/L (ref 135–145)

## 2011-03-15 LAB — SURGICAL PCR SCREEN
MRSA, PCR: NEGATIVE
Staphylococcus aureus: POSITIVE — AB

## 2011-03-15 LAB — CBC
Platelets: 276 10*3/uL (ref 150–400)
RBC: 4.45 MIL/uL (ref 3.87–5.11)
WBC: 7.6 10*3/uL (ref 4.0–10.5)

## 2011-03-19 ENCOUNTER — Inpatient Hospital Stay (HOSPITAL_COMMUNITY)
Admission: RE | Admit: 2011-03-19 | Discharge: 2011-03-20 | DRG: 159 | Disposition: A | Discharge status: Home / Self Care | Payer: Federal, State, Local not specified - PPO | Source: Ambulatory Visit | Attending: Surgery | Admitting: Surgery

## 2011-03-19 DIAGNOSIS — Z01812 Encounter for preprocedural laboratory examination: Secondary | ICD-10-CM

## 2011-03-19 DIAGNOSIS — K432 Incisional hernia without obstruction or gangrene: Principal | ICD-10-CM | POA: Diagnosis present

## 2011-03-19 DIAGNOSIS — J4489 Other specified chronic obstructive pulmonary disease: Secondary | ICD-10-CM | POA: Diagnosis present

## 2011-03-19 DIAGNOSIS — J449 Chronic obstructive pulmonary disease, unspecified: Secondary | ICD-10-CM | POA: Diagnosis present

## 2011-03-19 DIAGNOSIS — K219 Gastro-esophageal reflux disease without esophagitis: Secondary | ICD-10-CM | POA: Diagnosis present

## 2011-03-19 DIAGNOSIS — Z85528 Personal history of other malignant neoplasm of kidney: Secondary | ICD-10-CM

## 2011-03-19 DIAGNOSIS — Z01810 Encounter for preprocedural cardiovascular examination: Secondary | ICD-10-CM

## 2011-03-19 DIAGNOSIS — I10 Essential (primary) hypertension: Secondary | ICD-10-CM | POA: Diagnosis present

## 2011-04-08 NOTE — Op Note (Signed)
[name], [name]              ACCOUNT NO.:  [id number]  MEDICAL RECORD NO.:  [id number]           PATIENT TYPE:  I  LOCATION:  5121                         FACILITY:  [facility]  PHYSICIAN:  Abigail Miyamoto, M.D. DATE OF BIRTH:  1956/04/11  DATE OF PROCEDURE:  03/19/2011 DATE OF DISCHARGE:                              OPERATIVE REPORT   PREOPERATIVE DIAGNOSIS:  Incisional hernia.  POSTOPERATIVE DIAGNOSIS:  Incisional hernia.  PROCEDURE:  Laparoscopic incisional hernia repair with mesh.  SURGEON:  Abigail Miyamoto, MD  ANESTHESIA:  General and 0.5% Marcaine.  ESTIMATED BLOOD LOSS:  Minimal.  INDICATIONS:  [name] is a 55 year old female who has undergone multiple abdominal procedures including hernia repairs with mesh.  Her most recent surgery was a sigmoid colectomy over a year ago.  She now presents with a new incisional hernia.  Decision was made to proceed with laparoscopic repair.  PROCEDURE IN DETAIL:  The patient was brought to the operating room and identified as [name].  She was placed supine on the operating table and general anesthesia was induced.  A Foley catheter was inserted.  Her abdomen was then prepped and draped in usual sterile fashion.  Using a #15 blade, a small incision was made on the patient's right upper quadrant.  I then used the OptiVu to gain access into the abdominal cavity.  Insufflation of the abdominal cavities then began.  I then evaluated the area of the OptiVu port and saw no evidence of bowel injury.  The patient had multiple adhesions to the abdominal wall containing omentum and colon.  She had a hernia defect at the umbilicus as well as one in the midline above the umbilicus.  Her previously placed mesh and lower abdomen appeared intact.  I placed another 5-mm port in the patient's right lower quadrant, another in the left upper quadrant.  I then undertook lysis of adhesions which I did not take long.  There was only  omentum incarcerated in each of the hernias which easily reduced.  With using both blunt and sharp dissection, I was able to free up the rest of the adhesions from the abdominal wall.  I then brought a 15 cm x 20-cm piece of Physio mesh onto the field.  I placed four separate #1 Novafil pop-offs in the corners of the mesh.  I then rolled with the mesh in place through a side port which I had made larger and replaced the 5 with a 10-11 port.  The mesh went easily due to the large port.  I was able to then unroll it in the abdominal cavity under direct vision.  I then made four separate skin incision and using the suture passer pulled all the sutures up through the abdominal wall. This allowed me to pull the mesh tied up against the abdominal wall. Wide coverage of the hernia defects appeared to be achieved.  I then tied the sutures in place.  I then used the absorbable surgical tacker to tack the mesh in circumferentially.  Again, good coverage of the peritoneum and hernia defects appeared to be achieved.  At this point, all ports were  removed under direct vision after I evaluated for hemostasis.  The abdomen was then deflated.  All incisions were then anesthetized with Marcaine.  I closed all incisions with 4-0 Monocryl sutures.  Steri- Strips and Band-Aids were then applied.  The patient tolerated the procedure well.  All counts were correct at the end of the procedure. The patient was then extubated in the operating room and taken in stable condition to recovery room.     Abigail Miyamoto, M.D.     DB/MEDQ  D:  03/19/2011  T:  03/20/2011  Job:  [id number]  Electronically Signed by Abigail Miyamoto M.D. on 04/08/2011 07:05:15 AM

## 2011-04-16 ENCOUNTER — Encounter (INDEPENDENT_AMBULATORY_CARE_PROVIDER_SITE_OTHER): Payer: Self-pay | Admitting: Surgery

## 2011-04-27 NOTE — Op Note (Signed)
  NAMEDJENEBA, BARSCH NO.:  [id number]  MEDICAL RECORD NO.:  [id number]  LOCATION:  5121                         FACILITY:  [facility]  PHYSICIAN:  Abigail Miyamoto, M.D. DATE OF BIRTH:  [birth year]  DATE OF PROCEDURE: DATE OF DISCHARGE:  03/20/2011                              OPERATIVE REPORT   DISCHARGE DIAGNOSIS:  Ventral incisional hernia.  HISTORY:  This is a 55 year old female who has undergone multiple abdominal procedures including hernia repair.  She now presents with a recurrent hernia.  HOSPITAL COURSE:  The patient was admitted and taken to the operating room where she underwent a laparoscopic incisional repair with mesh. She tolerated the procedure well and was taken to a regular surgical floor.  On postop day #1, she was doing well.  Her pain was well- controlled with oral pain medications.  Her abdomen was soft.  Her incision was healing well and decision was made to discharge the patient to home.  DISCHARGE DIET:  Regular.  DISCHARGE ACTIVITY:  She will do no heavy lifting greater than 20 pounds for approximately 3-4 weeks.  She may shower.  DISCHARGE FOLLOWUP:  She will follow up in my office in 1 week post discharge.     Abigail Miyamoto, M.D.     DB/MEDQ  D:  04/21/2011  T:  04/22/2011  Job:  [id number]  Electronically Signed by Abigail Miyamoto M.D. on 04/27/2011 03:35:21 PM

## 2011-05-06 NOTE — Discharge Summary (Signed)
  NAMEKAMILAH, [name] NO.:  [id number]  MEDICAL RECORD NO.:  [id number]  LOCATION:  5121                         FACILITY:  [facility]  PHYSICIAN:  Abigail Miyamoto, M.D. DATE OF BIRTH:  Jan 13, 1956  DATE OF ADMISSION:  03/19/2011 DATE OF DISCHARGE:  03/20/2011                              DISCHARGE SUMMARY   DISCHARGE DIAGNOSIS:  Ventral incisional hernia.  SUMMARY OF HISTORY:  This is a female status post multiple abdominal procedures.  She now has an asymptomatic ventral incisional hernia and decision was made to proceed with laparoscopic repair.  HOSPITAL COURSE:  The patient was admitted on Mar 19, 2011, went to the operating room where she underwent a laparoscopic ventral incisional hernia repair with mesh.  She tolerated the procedure well, was taken to the regular surgical floor.  On postoperative day #1, she was actually doing very well.  She was tolerating her diet.  Her pain was controlled with oral medications.  Decision was made to discharge the patient to home.  DISCHARGE DIET:  Regular.  DISCHARGE ACTIVITY:  She will do no heavy lifting greater than 20 pounds for 4 weeks.  She will resume her home medications.  DISCHARGE FOLLOWUP:  She will follow up in my office in 1 week post discharge.     Abigail Miyamoto, M.D.     DB/MEDQ  D:  05/01/2011  T:  05/01/2011  Job:  [id number]  Electronically Signed by Abigail Miyamoto M.D. on 05/06/2011 07:24:10 PM

## 2011-08-04 ENCOUNTER — Other Ambulatory Visit: Payer: Self-pay | Admitting: Cardiovascular Disease

## 2011-08-04 MED ORDER — AMLODIPINE BESYLATE 5 MG PO TABS: 5.0000 mg | ORAL_TABLET | Freq: Every day | ORAL | Status: DC

## 2011-08-05 LAB — DIFFERENTIAL
Basophils Absolute: 0
Basophils Relative: 1
Eosinophils Absolute: 0.1
Eosinophils Absolute: 0.2
Eosinophils Relative: 2
Lymphocytes Relative: 30
Lymphs Abs: 1.8
Lymphs Abs: 2
Monocytes Absolute: 0.5
Monocytes Relative: 5
Monocytes Relative: 8
Neutro Abs: 4
Neutro Abs: 9.8 — ABNORMAL HIGH
Neutrophils Relative %: 60
Neutrophils Relative %: 79 — ABNORMAL HIGH

## 2011-08-05 LAB — COMPREHENSIVE METABOLIC PANEL WITH GFR
ALT: 10
ALT: 14
AST: 15
AST: 16
Albumin: 3.4 — ABNORMAL LOW
Albumin: 4
Alkaline Phosphatase: 66
Alkaline Phosphatase: 83
BUN: 11
BUN: 9
CO2: 28
CO2: 30
Calcium: 9
Calcium: 9.6
Chloride: 103
Chloride: 98
Creatinine, Ser: 0.66
Creatinine, Ser: 0.68
GFR calc non Af Amer: >60
GFR calc non Af Amer: >60
Glucose, Bld: 108 — ABNORMAL HIGH
Glucose, Bld: 83
Potassium: 3.9
Potassium: 4
Sodium: 136
Sodium: 139
Total Bilirubin: 0.4
Total Bilirubin: 0.4
Total Protein: 6.6
Total Protein: 7.8

## 2011-08-05 LAB — CULTURE, BLOOD (ROUTINE X 2)
Culture: NO GROWTH
Culture: NO GROWTH

## 2011-08-05 LAB — BASIC METABOLIC PANEL WITH GFR
BUN: 4 — ABNORMAL LOW
CO2: 26
Calcium: 9.1
Chloride: 106
Creatinine, Ser: 0.58
GFR calc non Af Amer: >60
Glucose, Bld: 95
Potassium: 3.6
Sodium: 139

## 2011-08-05 LAB — CBC
HCT: 33.1 — ABNORMAL LOW
HCT: 34.8 — ABNORMAL LOW
Hemoglobin: 11.2 — ABNORMAL LOW
Hemoglobin: 11.8 — ABNORMAL LOW
Hemoglobin: 12.2
Hemoglobin: 12.4
MCHC: 33.1
MCHC: 33.7
MCHC: 33.9
MCV: 84.5
MCV: 84.6
Platelets: 277
RBC: 3.92
RDW: 13.2
RDW: 13.7
RDW: 13.8
RDW: 14.2 — ABNORMAL HIGH
WBC: 6.7

## 2011-08-05 LAB — APTT: aPTT: 35

## 2011-08-05 LAB — BASIC METABOLIC PANEL
CO2: 27
Chloride: 106
GFR calc non Af Amer: >60
Glucose, Bld: 116 — ABNORMAL HIGH
Potassium: 3.6
Sodium: 141

## 2011-08-05 LAB — COMPREHENSIVE METABOLIC PANEL
ALT: 19
Calcium: 10.1
Glucose, Bld: 123 — ABNORMAL HIGH
Sodium: 139
Total Protein: 7.4

## 2011-08-05 LAB — URINALYSIS, ROUTINE W REFLEX MICROSCOPIC
Bilirubin Urine: NEGATIVE
Glucose, UA: NEGATIVE
Ketones, ur: NEGATIVE
Protein, ur: NEGATIVE

## 2011-08-05 LAB — PROTIME-INR
INR: 1.1
Prothrombin Time: 14.2

## 2011-08-05 LAB — OCCULT BLOOD X 1 CARD TO LAB, STOOL: Fecal Occult Bld: NEGATIVE

## 2011-10-06 ENCOUNTER — Other Ambulatory Visit: Payer: Self-pay | Admitting: Cardiovascular Disease

## 2012-06-03 ENCOUNTER — Other Ambulatory Visit: Payer: Self-pay | Admitting: Cardiovascular Disease

## 2012-12-14 ENCOUNTER — Other Ambulatory Visit: Payer: Self-pay | Admitting: *Deleted

## 2012-12-14 MED ORDER — AMLODIPINE BESYLATE 5 MG PO TABS: 5.0000 mg | ORAL_TABLET | Freq: Every day | ORAL | Status: DC

## 2012-12-14 NOTE — Telephone Encounter (Signed)
PHARMACY CALLED WANTING REFILLS FOR PATIENT NORVASC. INFORMED PHARMACIST PT NEED APPOINTMENT AND WOULD ONLY FILL FOR ANOTHER 30 DAYS WITH ONE REFILL. PHARMACY AGREED TO INFORM PATIENT. Fax Received. Refill Completed. [name] (R.M.A)  IT LOOKS LIKE IN OUR RECORDS THAT MED HAS BEEN FILLED BY OUR OFFICE BUT HAD NOT BEEN SEEN BY DR. Eden Emms IN OFFICE.

## 2013-02-15 ENCOUNTER — Telehealth: Payer: Self-pay | Admitting: *Deleted

## 2013-02-15 MED ORDER — AMLODIPINE BESYLATE 5 MG PO TABS: 5.0000 mg | ORAL_TABLET | Freq: Every day | ORAL | Status: DC

## 2013-02-15 NOTE — Telephone Encounter (Signed)
Called  Patient and left message for her to call the office and make an appointment to see the doctor. I spoke with Bari Mantis about this patient and she  advised to give her a 30 day supply with 0 refills.

## 2013-03-14 ENCOUNTER — Ambulatory Visit (INDEPENDENT_AMBULATORY_CARE_PROVIDER_SITE_OTHER): Payer: Federal, State, Local not specified - PPO | Admitting: Cardiovascular Disease

## 2013-03-14 ENCOUNTER — Encounter: Payer: Self-pay | Admitting: Cardiovascular Disease

## 2013-03-14 VITALS — BP 113/71 | HR 100 | Ht 66.0 in | Wt 151.4 lb

## 2013-03-14 DIAGNOSIS — R002 Palpitations: Secondary | ICD-10-CM

## 2013-03-14 DIAGNOSIS — R011 Cardiac murmur, unspecified: Secondary | ICD-10-CM

## 2013-03-14 DIAGNOSIS — I10 Essential (primary) hypertension: Secondary | ICD-10-CM

## 2013-03-14 MED ORDER — AMLODIPINE BESYLATE 5 MG PO TABS: 5.0000 mg | ORAL_TABLET | Freq: Every day | ORAL | Status: DC

## 2013-03-14 NOTE — Progress Notes (Signed)
Patient ID: [name], female   DOB: 06/28/1956, 57 y.o.   MRN: [id number] Drenda Freeze seen today as a self referral for her palpitations and hypertension. She was previously seen in 2010  SHe was intolerant of Toprol. No problems with higher dose calcium channel blockers for her palpitations .  Palpitations are improved. Blood pressures improved. They've a lot of her symptoms are related to anxiety state. Seems to worry about a lot of things. She she use to see a a cancer doctor at [facility].  Had renal cancer. Sees Dr Zachery Dauer now with [facility]. Not any significant chest pain or dyspnea.She has a 57-year-old and a 33  year-old grandchild. 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 He tries to follow a low-sodium diet.  Echo 12/05/08 reviewed and normal  ROS: Denies fever, malais, weight loss, blurry vision, decreased visual acuity, cough, sputum, SOB, hemoptysis, pleuritic pain, palpitaitons, heartburn, abdominal pain, melena, lower extremity edema, claudication, or rash.  All other systems reviewed and negative   General: Affect appropriate Healthy:  appears stated age HEENT: normal Neck supple with no adenopathy JVP normal no bruits no thyromegaly Lungs clear with no wheezing and good diaphragmatic motion Heart:  S1/S2 3/6 SEM murmur,rub, gallop or click PMI normal Abdomen: benighn, BS positve, no tenderness, no AAA no bruit.  No HSM or HJR Distal pulses intact with no bruits No edema Neuro non-focal Skin warm and dry No muscular weakness  Medications Current Outpatient Prescriptions  Medication Sig Dispense Refill  . amLODipine (NORVASC) 5 MG tablet TAKE 1 TABLET (5 MG TOTAL) BY MOUTH DAILY (NEED APPT)  30 tablet  1  . amLODipine (NORVASC) 5 MG tablet Take 1 tablet (5 mg total) by mouth daily.  30 tablet  0   No current facility-administered medications for this visit.    Allergies Cyprodenate; Diphenhydramine hcl; and Codeine  Family History: Family History  Problem Relation Age of  Onset  . Cancer Mother   . Cancer Father     Social History: History   Social History  . Marital Status: Married    Spouse Name: N/A    Number of Children: N/A  . Years of Education: N/A   Occupational History  . Not on file.   Social History Main Topics  . Smoking status: Never Smoker   . Smokeless tobacco: Never Used  . Alcohol Use: No  . Drug Use:   . Sexually Active:    Other Topics Concern  . Not on file   Social History Narrative  . No narrative on file    Electrocardiogram:  Low atrial focus regular rhyhthm rate 93 normal QRS complexes  Assessment and Plan

## 2013-03-14 NOTE — Assessment & Plan Note (Signed)
Fairly loud in aortic area Echo 2010 with no significant lesions F/U echo

## 2013-03-14 NOTE — Assessment & Plan Note (Signed)
Resolved Intolerant to Toprol in past

## 2013-03-14 NOTE — Patient Instructions (Signed)

## 2013-03-14 NOTE — Assessment & Plan Note (Signed)
Well controlled.  Continue current medications and low sodium Dash type diet.   Refilled norvasc but told her this could be done by her primary in future

## 2013-03-22 ENCOUNTER — Ambulatory Visit (HOSPITAL_COMMUNITY): Payer: Federal, State, Local not specified - PPO | Attending: Cardiology

## 2013-03-22 DIAGNOSIS — R011 Cardiac murmur, unspecified: Secondary | ICD-10-CM | POA: Insufficient documentation

## 2013-03-22 NOTE — Progress Notes (Signed)
Echocardiogram performed.  

## 2014-04-03 ENCOUNTER — Other Ambulatory Visit: Payer: Self-pay | Admitting: *Deleted

## 2014-04-03 MED ORDER — AMLODIPINE BESYLATE 5 MG PO TABS: 5.0000 mg | ORAL_TABLET | Freq: Every day | ORAL | Status: DC

## 2014-04-04 ENCOUNTER — Other Ambulatory Visit: Payer: Self-pay

## 2014-04-04 DIAGNOSIS — Z1231 Encounter for screening mammogram for malignant neoplasm of breast: Secondary | ICD-10-CM

## 2014-04-12 ENCOUNTER — Ambulatory Visit
Admission: RE | Admit: 2014-04-12 | Discharge: 2014-04-12 | Disposition: A | Discharge status: Home / Self Care | Payer: Federal, State, Local not specified - PPO | Source: Ambulatory Visit

## 2014-04-12 DIAGNOSIS — Z1231 Encounter for screening mammogram for malignant neoplasm of breast: Secondary | ICD-10-CM

## 2015-01-03 ENCOUNTER — Other Ambulatory Visit: Payer: Self-pay

## 2015-01-03 MED ORDER — AMLODIPINE BESYLATE 5 MG PO TABS: 5.0000 mg | ORAL_TABLET | Freq: Every day | ORAL | Status: DC

## 2015-07-13 ENCOUNTER — Other Ambulatory Visit: Payer: Self-pay | Admitting: Cardiovascular Disease

## 2015-07-13 DIAGNOSIS — I351 Nonrheumatic aortic (valve) insufficiency: Secondary | ICD-10-CM

## 2015-07-13 DIAGNOSIS — I350 Nonrheumatic aortic (valve) stenosis: Secondary | ICD-10-CM

## 2015-07-14 ENCOUNTER — Telehealth: Payer: Self-pay | Admitting: Cardiovascular Disease

## 2015-07-14 NOTE — Telephone Encounter (Signed)
LM TO CALL BACK ./CY 

## 2015-07-14 NOTE — Telephone Encounter (Signed)
PT OVER DUE FOR ECHO . echocardiogram SCHEDULED FOR   07-18-15 AT  10:30 AND F/U WITH  DR [facility]   FOR  08-27-15  AT  8:15 AM

## 2015-07-14 NOTE — Telephone Encounter (Signed)
LM TO  CALL BACK PT IS  OVERDUE FOR  ECHO  DX  AS  AND  AR  .Adonis Housekeeper

## 2015-07-14 NOTE — Telephone Encounter (Signed)
New message ° ° ° ° °Returning Christine's call °

## 2015-07-14 NOTE — Telephone Encounter (Signed)
Patient has not been seen since 2014, but is prn follow up. Ok to refill or should this be deferred to pcp? Please advise. Thanks, MI

## 2015-07-18 ENCOUNTER — Ambulatory Visit (HOSPITAL_COMMUNITY): Payer: Federal, State, Local not specified - PPO | Attending: Cardiology

## 2015-07-18 ENCOUNTER — Other Ambulatory Visit: Payer: Self-pay

## 2015-07-18 DIAGNOSIS — I10 Essential (primary) hypertension: Secondary | ICD-10-CM | POA: Insufficient documentation

## 2015-07-18 DIAGNOSIS — I351 Nonrheumatic aortic (valve) insufficiency: Secondary | ICD-10-CM

## 2015-07-18 DIAGNOSIS — I313 Pericardial effusion (noninflammatory): Secondary | ICD-10-CM | POA: Diagnosis not present

## 2015-07-18 DIAGNOSIS — I352 Nonrheumatic aortic (valve) stenosis with insufficiency: Secondary | ICD-10-CM | POA: Insufficient documentation

## 2015-07-18 DIAGNOSIS — I5189 Other ill-defined heart diseases: Secondary | ICD-10-CM | POA: Insufficient documentation

## 2015-07-18 DIAGNOSIS — I350 Nonrheumatic aortic (valve) stenosis: Secondary | ICD-10-CM | POA: Diagnosis present

## 2015-08-25 ENCOUNTER — Encounter: Payer: Self-pay | Admitting: Cardiovascular Disease

## 2015-08-25 NOTE — Progress Notes (Signed)
Patient ID: [name], female   DOB: 1956-07-08, 59 y.o.   MRN: [id number] 59 y.o. female first seen in 2014  self referral for her palpitations and hypertension. She was previously seen in 2010  SHe was intolerant of Toprol. No problems with higher dose calcium channel blockers for her palpitations .  Palpitations are improved. Blood pressures improved. They've a lot of her symptoms are related to anxiety state. Seems to worry about a lot of things. She she use to see a a cancer doctor at [place].  Had renal cancer. Sees Dr Drema Dallas now with [facility]. Not any significant chest pain or dyspnea.She has a 12-year-old and a 57  year-old grandchild. 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 He tries to follow a low-sodium diet.  Echo 07/18/15  Study Conclusions  - Left ventricle: The cavity size was normal. Wall thickness was normal. Systolic function was normal. The estimated ejection fraction was in the range of 60% to 65%. Wall motion was normal; there were no regional wall motion abnormalities. Doppler parameters are consistent with abnormal left ventricular relaxation (grade 1 diastolic dysfunction). - Aortic valve: Valve mobility was restricted. There was moderate to severe stenosis. There was mild regurgitation. - Pericardium, extracardiac: A trivial pericardial effusion was identified.  Impressions:  - Normal LV function; grade 1 diastolic dysfunction; heavily calcified aortic valve with moderate to severe AS (mean gradient 36 mmHg; peak velocity 4.1 m/s); mild AI; trace MR and TR.  ROS: Denies fever, malais, weight loss, blurry vision, decreased visual acuity, cough, sputum, SOB, hemoptysis, pleuritic pain, palpitaitons, heartburn, abdominal pain, melena, lower extremity edema, claudication, or rash.  All other systems reviewed and negative   General: Affect appropriate Healthy:  appears stated age 23: normal Neck supple with no adenopathy JVP normal no bruits  no thyromegaly Lungs clear with no wheezing and good diaphragmatic motion Heart:  S1/S2 3/6 SEM murmur,rub, gallop or click PMI normal Abdomen: benighn, BS positve, no tenderness, no AAA no bruit.  No HSM or HJR Distal pulses intact with no bruits No edema Neuro non-focal Skin warm and dry No muscular weakness  Medications Current Outpatient Prescriptions  Medication Sig Dispense Refill  . amLODipine (NORVASC) 5 MG tablet TAKE 1 TABLET BY MOUTH EVERY DAY 90 tablet 0  . fluticasone (FLONASE) 50 MCG/ACT nasal spray Place 2 sprays into both nostrils daily as needed. For allergies  1  . omeprazole (PRILOSEC) 40 MG capsule Take 40 mg by mouth daily as needed (heartburn).     No current facility-administered medications for this visit.    Allergies Cyprodenate; Diphenhydramine hcl; and Codeine  Family History: Family History  Problem Relation Age of Onset  . Cancer Mother   . Cancer Father     Social History: Social History   Social History  . Marital Status: Married    Spouse Name: N/A  . Number of Children: N/A  . Years of Education: N/A   Occupational History  . Not on file.   Social History Main Topics  . Smoking status: Never Smoker   . Smokeless tobacco: Never Used  . Alcohol Use: No  . Drug Use: Not on file  . Sexual Activity: Not on file   Other Topics Concern  . Not on file   Social History Narrative    Electrocardiogram:  Low atrial focus regular rhyhthm rate 93 normal QRS complexes  08/26/15  SR rate 78 normal   Assessment and Plan  Palpitations :  Resolved monitor HTN:  Well controlled.  Continue current medications and low sodium Dash type diet.   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  Discussed natural history f/u echo in March Will also check carotid as murmur radiates to neck strongly   F/U with me in March   [facility]

## 2015-08-27 ENCOUNTER — Ambulatory Visit (INDEPENDENT_AMBULATORY_CARE_PROVIDER_SITE_OTHER): Payer: Federal, State, Local not specified - PPO | Admitting: Cardiovascular Disease

## 2015-08-27 ENCOUNTER — Encounter: Payer: Self-pay | Admitting: Cardiovascular Disease

## 2015-08-27 ENCOUNTER — Encounter: Payer: Self-pay | Admitting: *Deleted

## 2015-08-27 VITALS — BP 134/70 | HR 78 | Ht 66.0 in | Wt 157.2 lb

## 2015-08-27 DIAGNOSIS — R011 Cardiac murmur, unspecified: Secondary | ICD-10-CM

## 2015-08-27 DIAGNOSIS — R0989 Other specified symptoms and signs involving the circulatory and respiratory systems: Secondary | ICD-10-CM | POA: Diagnosis not present

## 2015-08-27 NOTE — Patient Instructions (Addendum)
Medication Instructions:  Your physician recommends that you continue on your current medications as directed. Please refer to the Current Medication list given to you today.Worthy Keeler: NONE  Testing/Procedures: Your physician has requested that you have an echocardiogram. Echocardiography is a painless test that uses sound waves to create images of your heart. It provides your doctor with information about the size and shape of your heart and how well your heart's chambers and valves are working. This procedure takes approximately one hour. There are no restrictions for this procedure. [facility] Your physician has requested that you have a carotid duplex. This test is an ultrasound of the carotid arteries in your neck. It looks at blood flow through these arteries that supply the brain with blood. Allow one hour for this exam. There are no restrictions or special instructions.   Follow-Up: Your physician wants you to follow-up in: [place] ECHO SAME DAY AND CAROTID   DO BOTH  AT   [place] will receive a reminder letter in the mail two months in advance. If you don't receive a letter, please call our office to schedule the follow-up appointment.  Any Other Special Instructions Will Be Listed Below (If Applicable).     If you need a refill on your cardiac medications before your next appointment, please call your pharmacy.

## 2015-09-05 ENCOUNTER — Encounter (HOSPITAL_COMMUNITY): Payer: Federal, State, Local not specified - PPO

## 2015-09-05 ENCOUNTER — Other Ambulatory Visit (HOSPITAL_COMMUNITY): Payer: Federal, State, Local not specified - PPO

## 2015-10-10 ENCOUNTER — Other Ambulatory Visit: Payer: Self-pay | Admitting: Cardiovascular Disease

## 2015-12-30 ENCOUNTER — Ambulatory Visit (HOSPITAL_COMMUNITY)
Admission: RE | Admit: 2015-12-30 | Discharge: 2015-12-30 | Disposition: A | Discharge status: Home / Self Care | Payer: Federal, State, Local not specified - PPO | Source: Ambulatory Visit | Attending: Cardiovascular Disease | Admitting: Cardiovascular Disease

## 2015-12-30 ENCOUNTER — Telehealth: Payer: Self-pay

## 2015-12-30 ENCOUNTER — Ambulatory Visit (HOSPITAL_BASED_OUTPATIENT_CLINIC_OR_DEPARTMENT_OTHER): Payer: Federal, State, Local not specified - PPO

## 2015-12-30 ENCOUNTER — Other Ambulatory Visit: Payer: Self-pay

## 2015-12-30 DIAGNOSIS — I6523 Occlusion and stenosis of bilateral carotid arteries: Secondary | ICD-10-CM | POA: Diagnosis not present

## 2015-12-30 DIAGNOSIS — Z87891 Personal history of nicotine dependence: Secondary | ICD-10-CM | POA: Diagnosis not present

## 2015-12-30 DIAGNOSIS — R011 Cardiac murmur, unspecified: Secondary | ICD-10-CM | POA: Diagnosis not present

## 2015-12-30 DIAGNOSIS — I350 Nonrheumatic aortic (valve) stenosis: Secondary | ICD-10-CM

## 2015-12-30 DIAGNOSIS — I352 Nonrheumatic aortic (valve) stenosis with insufficiency: Secondary | ICD-10-CM | POA: Diagnosis not present

## 2015-12-30 DIAGNOSIS — Z85528 Personal history of other malignant neoplasm of kidney: Secondary | ICD-10-CM | POA: Diagnosis not present

## 2015-12-30 DIAGNOSIS — I10 Essential (primary) hypertension: Secondary | ICD-10-CM | POA: Insufficient documentation

## 2015-12-30 DIAGNOSIS — R938 Abnormal findings on diagnostic imaging of other specified body structures: Secondary | ICD-10-CM | POA: Diagnosis not present

## 2015-12-30 DIAGNOSIS — R0989 Other specified symptoms and signs involving the circulatory and respiratory systems: Secondary | ICD-10-CM

## 2015-12-30 NOTE — Telephone Encounter (Signed)
Recall in for echo to be scheduled in one year.

## 2015-12-30 NOTE — Telephone Encounter (Signed)
-----   Message from Josue Hector, MD sent at 12/30/2015  3:07 PM EST ----- Moderate AS/AR stable f/u echo in a year EF normal

## 2016-01-07 NOTE — Progress Notes (Signed)
Patient ID: [name], female   DOB: 1956/03/10, 60 y.o.   MRN: [id number]   59 y.o. female first seen in 2014  self referral for her palpitations and hypertension. She was previously seen in 2010  SHe was intolerant of Toprol. No problems with higher dose calcium channel blockers for her palpitations .  Palpitations are improved. Blood pressures improved. They've a lot of her symptoms are related to anxiety state. Seems to worry about a lot of things. She she use to see a a cancer doctor at [place].  Had renal cancer. Sees Dr Drema Dallas now with [facility]. Not any significant chest pain or dyspnea.She has a 77-year-old and a 78  year-old grandchild. 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 He tries to follow a low-sodium diet.  Echo 12/30/15   Study Conclusions  - Left ventricle: The cavity size was normal. Systolic function was  normal. The estimated ejection fraction was in the range of 55%  to 60%. Wall motion was normal; there were no regional wall  motion abnormalities. Doppler parameters are consistent with  abnormal left ventricular relaxation (grade 1 diastolic  dysfunction). - Aortic valve: Trileaflet; moderately thickened, moderately  calcified leaflets. Valve mobility was restricted. There was  moderate stenosis. There was moderate regurgitation. Peak  velocity (S): 361 cm/s. Mean gradient (S): 28 mm Hg. - Aorta: Aortic root dimension: 38 mm (ED). - Ascending aorta: The ascending aorta was mildly dilated. - Pulmonary arteries: Systolic pressure was mildly increased. PA  peak pressure: 40 mm Hg (S).  Impressions:  - No significant change in aortic stenosis when compared to prior  echocardiogram. (Prior mean gradient 36mHg).  Carotids with plaque no stenosis as murmur radiated to neck   ROS: Denies fever, malais, weight loss, blurry vision, decreased visual acuity, cough, sputum, SOB, hemoptysis, pleuritic pain, palpitaitons, heartburn, abdominal pain, melena, lower  extremity edema, claudication, or rash.  All other systems reviewed and negative   General: Affect appropriate Healthy:  appears stated age H22 normal Neck supple with no adenopathy JVP normal no bruits no thyromegaly Lungs clear with no wheezing and good diaphragmatic motion Heart:  S1/S2 3/6 SEM of AS murmur,rub, gallop or click PMI normal Abdomen: benighn, BS positve, no tenderness, no AAA no bruit.  No HSM or HJR Distal pulses intact with no bruits No edema Neuro non-focal Skin warm and dry No muscular weakness  Medications Current Outpatient Prescriptions  Medication Sig Dispense Refill  . amLODipine (NORVASC) 5 MG tablet TAKE 1 TABLET BY MOUTH EVERY DAY 90 tablet 3  . fluticasone (FLONASE) 50 MCG/ACT nasal spray Place 2 sprays into both nostrils daily as needed. For allergies  1  . omeprazole (PRILOSEC) 40 MG capsule Take 40 mg by mouth daily as needed (heartburn).     No current facility-administered medications for this visit.    Allergies Cyprodenate; Diphenhydramine hcl; Codeine; and Morphine and related  Family History: Family History  Problem Relation Age of Onset  . Cancer Mother   . Cancer Father     Social History: Social History   Social History  . Marital Status: Married    Spouse Name: N/A  . Number of Children: N/A  . Years of Education: N/A   Occupational History  . Not on file.   Social History Main Topics  . Smoking status: Never Smoker   . Smokeless tobacco: Never Used  . Alcohol Use: No  . Drug Use: Not on file  . Sexual Activity: Not on file  Other Topics Concern  . Not on file   Social History Narrative    Electrocardiogram:  Low atrial focus regular rhyhthm rate 93 normal QRS complexes  08/26/15  SR rate 78 normal   Assessment and Plan  Palpitations :  Resolved monitor HTN:  Well controlled.  Continue current medications and low sodium Dash type diet.   AS:  Moderate  Active walks 10 miles/day f/u echo in a year   Will do ETT to r/o  CAD given severity of valve disease and age  Baseline ECG is normal and does not need Nuclear study.  Would like to see good MET level and normal HR/BP response to tell AS is truly asymptomatic  F/U with me in  6 months    Jenkins Rouge

## 2016-01-08 ENCOUNTER — Encounter: Payer: Self-pay | Admitting: Cardiovascular Disease

## 2016-01-12 ENCOUNTER — Encounter: Payer: Self-pay | Admitting: Cardiovascular Disease

## 2016-01-12 ENCOUNTER — Ambulatory Visit (INDEPENDENT_AMBULATORY_CARE_PROVIDER_SITE_OTHER): Payer: Federal, State, Local not specified - PPO | Admitting: Cardiovascular Disease

## 2016-01-12 VITALS — BP 110/62 | HR 92 | Ht 66.0 in | Wt 159.2 lb

## 2016-01-12 DIAGNOSIS — I350 Nonrheumatic aortic (valve) stenosis: Secondary | ICD-10-CM | POA: Diagnosis not present

## 2016-01-12 DIAGNOSIS — R011 Cardiac murmur, unspecified: Secondary | ICD-10-CM | POA: Diagnosis not present

## 2016-01-12 DIAGNOSIS — R0602 Shortness of breath: Secondary | ICD-10-CM

## 2016-01-12 NOTE — Patient Instructions (Addendum)
Medication Instructions:  Your physician recommends that you continue on your current medications as directed. Please refer to the Current Medication list given to you today.  Labwork: NONE  Testing/Procedures: Your physician has requested that you have an exercise tolerance test. For further information please visit [email or web address]. Please also follow instruction sheet, as given.  Follow-Up: Your physician wants you to follow-up in: 6 months with Dr. Nishan. You will receive a reminder letter in the mail two months in advance. If you don't receive a letter, please call our office to schedule the follow-up appointment.   If you need a refill on your cardiac medications before your next appointment, please call your pharmacy.    

## 2016-01-20 ENCOUNTER — Ambulatory Visit (INDEPENDENT_AMBULATORY_CARE_PROVIDER_SITE_OTHER): Payer: Federal, State, Local not specified - PPO

## 2016-01-20 DIAGNOSIS — R0602 Shortness of breath: Secondary | ICD-10-CM

## 2016-01-20 DIAGNOSIS — I350 Nonrheumatic aortic (valve) stenosis: Secondary | ICD-10-CM | POA: Diagnosis not present

## 2016-01-20 LAB — EXERCISE TOLERANCE TEST
CHL CUP STRESS STAGE 1 DBP: 83 mmHg
CHL CUP STRESS STAGE 1 GRADE: 0 %
CHL CUP STRESS STAGE 1 SBP: 130 mmHg
CHL CUP STRESS STAGE 1 SPEED: 0 mph
CHL CUP STRESS STAGE 2 GRADE: 0 %
CHL CUP STRESS STAGE 2 HR: 97 {beats}/min
CHL CUP STRESS STAGE 4 GRADE: 10 %
CHL CUP STRESS STAGE 4 HR: 110 {beats}/min
CHL CUP STRESS STAGE 4 SBP: 143 mmHg
CHL CUP STRESS STAGE 5 DBP: 79 mmHg
CHL CUP STRESS STAGE 5 GRADE: 12 %
CHL CUP STRESS STAGE 5 SBP: 179 mmHg
CHL CUP STRESS STAGE 5 SPEED: 2.5 mph
CHL CUP STRESS STAGE 6 HR: 146 {beats}/min
CHL CUP STRESS STAGE 6 SBP: 176 mmHg
CHL CUP STRESS STAGE 7 GRADE: 16 %
CHL CUP STRESS STAGE 7 HR: 157 {beats}/min
CHL CUP STRESS STAGE 7 SPEED: 4.2 mph
CHL CUP STRESS STAGE 8 DBP: 81 mmHg
CHL CUP STRESS STAGE 9 DBP: 78 mmHg
CHL CUP STRESS STAGE 9 GRADE: 0 %
CHL CUP STRESS STAGE 9 SPEED: 0 mph
CHL RATE OF PERCEIVED EXERTION: 15
CSEPED: 9 min
CSEPEDS: 30 s
CSEPHR: 100 %
CSEPPHR: 157 {beats}/min
Estimated workload: 10.9 METS
MPHR: 159 {beats}/min
Percent of predicted max HR: 98 %
Rest HR: 90 {beats}/min
Stage 1 HR: 89 {beats}/min
Stage 2 Speed: 1 mph
Stage 3 Grade: 0 %
Stage 3 HR: 97 {beats}/min
Stage 3 Speed: 1 mph
Stage 4 DBP: 79 mmHg
Stage 4 Speed: 1.7 mph
Stage 5 HR: 127 {beats}/min
Stage 6 DBP: 81 mmHg
Stage 6 Grade: 14 %
Stage 6 Speed: 3.4 mph
Stage 8 Grade: 0 %
Stage 8 HR: 131 {beats}/min
Stage 8 SBP: 174 mmHg
Stage 8 Speed: 0 mph
Stage 9 HR: 99 {beats}/min
Stage 9 SBP: 159 mmHg

## 2016-02-28 ENCOUNTER — Emergency Department (HOSPITAL_COMMUNITY): Payer: Federal, State, Local not specified - PPO

## 2016-02-28 ENCOUNTER — Observation Stay (HOSPITAL_COMMUNITY)
Admission: EM | Admit: 2016-02-28 | Discharge: 2016-02-28 | Disposition: A | Discharge status: Home / Self Care | Payer: Federal, State, Local not specified - PPO | Attending: Internal Medicine | Admitting: Internal Medicine

## 2016-02-28 ENCOUNTER — Encounter (HOSPITAL_COMMUNITY): Payer: Self-pay | Admitting: Emergency Medicine

## 2016-02-28 ENCOUNTER — Observation Stay (HOSPITAL_COMMUNITY): Payer: Federal, State, Local not specified - PPO

## 2016-02-28 DIAGNOSIS — I350 Nonrheumatic aortic (valve) stenosis: Secondary | ICD-10-CM | POA: Diagnosis not present

## 2016-02-28 DIAGNOSIS — Z79899 Other long term (current) drug therapy: Secondary | ICD-10-CM | POA: Diagnosis not present

## 2016-02-28 DIAGNOSIS — I10 Essential (primary) hypertension: Secondary | ICD-10-CM | POA: Insufficient documentation

## 2016-02-28 DIAGNOSIS — K838 Other specified diseases of biliary tract: Secondary | ICD-10-CM

## 2016-02-28 DIAGNOSIS — R0602 Shortness of breath: Secondary | ICD-10-CM | POA: Insufficient documentation

## 2016-02-28 DIAGNOSIS — K219 Gastro-esophageal reflux disease without esophagitis: Secondary | ICD-10-CM | POA: Diagnosis not present

## 2016-02-28 DIAGNOSIS — Z85528 Personal history of other malignant neoplasm of kidney: Secondary | ICD-10-CM | POA: Diagnosis not present

## 2016-02-28 DIAGNOSIS — R0789 Other chest pain: Secondary | ICD-10-CM | POA: Diagnosis not present

## 2016-02-28 DIAGNOSIS — Z905 Acquired absence of kidney: Secondary | ICD-10-CM | POA: Diagnosis not present

## 2016-02-28 DIAGNOSIS — R079 Chest pain, unspecified: Secondary | ICD-10-CM

## 2016-02-28 HISTORY — DX: Malignant neoplasm of unspecified kidney, except renal pelvis: C64.9

## 2016-02-28 LAB — CBC
HEMATOCRIT: 39.4 % (ref 36.0–46.0)
HEMOGLOBIN: 12.4 g/dL (ref 12.0–15.0)
MCH: 28.2 pg (ref 26.0–34.0)
MCHC: 31.5 g/dL (ref 30.0–36.0)
MCV: 89.7 fL (ref 78.0–100.0)
Platelets: 236 10*3/uL (ref 150–400)
RBC: 4.39 MIL/uL (ref 3.87–5.11)
RDW: 13.4 % (ref 11.5–15.5)
WBC: 5.4 10*3/uL (ref 4.0–10.5)

## 2016-02-28 LAB — URINALYSIS, ROUTINE W REFLEX MICROSCOPIC
BILIRUBIN URINE: NEGATIVE
Glucose, UA: NEGATIVE mg/dL
Hgb urine dipstick: NEGATIVE
KETONES UR: NEGATIVE mg/dL
Leukocytes, UA: NEGATIVE
NITRITE: NEGATIVE
Protein, ur: NEGATIVE mg/dL
pH: 5.5 (ref 5.0–8.0)

## 2016-02-28 LAB — HEPATIC FUNCTION PANEL
ALBUMIN: 4.1 g/dL (ref 3.5–5.0)
ALK PHOS: 75 U/L (ref 38–126)
ALT: 17 U/L (ref 14–54)
AST: 22 U/L (ref 15–41)
Bilirubin, Direct: <0.1 mg/dL — ABNORMAL LOW (ref 0.1–0.5)
TOTAL PROTEIN: 7.4 g/dL (ref 6.5–8.1)
Total Bilirubin: 0.3 mg/dL (ref 0.3–1.2)

## 2016-02-28 LAB — BRAIN NATRIURETIC PEPTIDE: B Natriuretic Peptide: 13.3 pg/mL (ref 0.0–100.0)

## 2016-02-28 LAB — BASIC METABOLIC PANEL
ANION GAP: 11 (ref 5–15)
BUN: 18 mg/dL (ref 6–20)
CHLORIDE: 100 mmol/L — AB (ref 101–111)
CO2: 27 mmol/L (ref 22–32)
Calcium: 9.5 mg/dL (ref 8.9–10.3)
Creatinine, Ser: 0.62 mg/dL (ref 0.44–1.00)
GFR calc Af Amer: >60 mL/min (ref >60)
GLUCOSE: 109 mg/dL — AB (ref 65–99)
POTASSIUM: 4.3 mmol/L (ref 3.5–5.1)
Sodium: 138 mmol/L (ref 135–145)

## 2016-02-28 LAB — D-DIMER, QUANTITATIVE: D-Dimer, Quant: 0.34 ug/mL-FEU (ref 0.00–0.50)

## 2016-02-28 LAB — I-STAT TROPONIN, ED: Troponin i, poc: 0 ng/mL (ref 0.00–0.08)

## 2016-02-28 LAB — LIPASE, BLOOD: LIPASE: 36 U/L (ref 11–51)

## 2016-02-28 LAB — TROPONIN I

## 2016-02-28 MED ORDER — ASPIRIN 81 MG PO CHEW
324.0000 mg | CHEWABLE_TABLET | Freq: Once | ORAL | Status: AC
  Administered 2016-02-28: 324 mg via ORAL
  Filled 2016-02-28: qty 4

## 2016-02-28 MED ORDER — ACETAMINOPHEN 325 MG PO TABS: 650.0000 mg | ORAL_TABLET | ORAL | Status: DC | PRN

## 2016-02-28 MED ORDER — METOPROLOL TARTRATE 5 MG/5ML IV SOLN
INTRAVENOUS | Status: AC
  Filled 2016-02-28: qty 5

## 2016-02-28 MED ORDER — AMLODIPINE BESYLATE 5 MG PO TABS: 5.0000 mg | ORAL_TABLET | Freq: Every day | ORAL | Status: DC

## 2016-02-28 MED ORDER — IOPAMIDOL (ISOVUE-370) INJECTION 76%
80.0000 mL | Freq: Once | INTRAVENOUS | Status: AC | PRN
  Administered 2016-02-28: 80 mL via INTRAVENOUS

## 2016-02-28 MED ORDER — PANTOPRAZOLE SODIUM 40 MG PO TBEC: 40.0000 mg | DELAYED_RELEASE_TABLET | Freq: Every day | ORAL | Status: DC

## 2016-02-28 MED ORDER — IOPAMIDOL (ISOVUE-370) INJECTION 76%
INTRAVENOUS | Status: AC
  Administered 2016-02-28: 80 mL
  Filled 2016-02-28: qty 100

## 2016-02-28 MED ORDER — HYDROMORPHONE HCL 1 MG/ML IJ SOLN
0.5000 mg | Freq: Once | INTRAMUSCULAR | Status: AC
Start: 2016-02-28 — End: 2016-02-28
  Administered 2016-02-28: 0.5 mg via INTRAVENOUS
  Filled 2016-02-28: qty 1

## 2016-02-28 MED ORDER — ENOXAPARIN SODIUM 40 MG/0.4ML ~~LOC~~ SOLN: 40.0000 mg | SUBCUTANEOUS | Status: DC

## 2016-02-28 MED ORDER — ONDANSETRON HCL 4 MG/2ML IJ SOLN: 4.0000 mg | Freq: Four times a day (QID) | INTRAMUSCULAR | Status: DC | PRN

## 2016-02-28 MED ORDER — FLUTICASONE PROPIONATE 50 MCG/ACT NA SUSP: 2.0000 | Freq: Every day | NASAL | Status: DC | PRN

## 2016-02-28 MED ORDER — NITROGLYCERIN 0.4 MG SL SUBL
SUBLINGUAL_TABLET | SUBLINGUAL | Status: AC
  Filled 2016-02-28: qty 2

## 2016-02-28 MED ORDER — ONDANSETRON HCL 4 MG/2ML IJ SOLN
4.0000 mg | Freq: Once | INTRAMUSCULAR | Status: AC
  Administered 2016-02-28: 4 mg via INTRAVENOUS
  Filled 2016-02-28: qty 2

## 2016-02-28 MED ORDER — METOPROLOL TARTRATE 25 MG PO TABS
50.0000 mg | ORAL_TABLET | Freq: Once | ORAL | Status: AC
  Administered 2016-02-28: 50 mg via ORAL
  Filled 2016-02-28: qty 2

## 2016-02-28 NOTE — ED Provider Notes (Signed)
CSN: [id number]     Arrival date & time 02/28/16  [id number] History   First MD Initiated Contact with Patient 02/28/16 [phone]     Chief Complaint  Patient presents with  . Chest Pain     (Consider location/radiation/quality/duration/timing/severity/associated sxs/prior Treatment) HPI   [name] is a 60 y.o. female, with a history of hypertension, aortic stenosis, and significant family cardiac history, presenting to the ED with chest discomfort and shortness. 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 Pt woke up at 0500 this morning with more intense discomfort. Pt rates her discomfort at 6/10, located in the left chest and radiates to her back, states it's like a pressure. Pt also endorses lightheadedness, nausea, and diaphoresis. Pt has taken her normal daily medications today. Patient denies LOC, cough, recent illness, fever/chills, or any other complaints. Patient denies history of PE/DVT or recent cancer, immobilization, surgery, or trauma.    Past Medical History  Diagnosis Date  . Hypertension   . Diverticulitis, colon   . GERD (gastroesophageal reflux disease)   . Chronic kidney disease   . Renal cancer [facility]) 2007    Surgically removed from right kidney   Past Surgical History  Procedure Laterality Date  . Appendectomy    . Hernia repair    . Ovarian cyst surgery     Family History  Problem Relation Age of Onset  . Cancer Mother     Lung  . Cancer Father     Lung  . Hypertension Father   . Cancer Sister     Lung  . Heart attack Paternal Grandfather   . Heart attack Paternal Aunt    Social History  Substance Use Topics  . Smoking status: Never Smoker   . Smokeless tobacco: Never Used  . Alcohol Use: No   OB History    No data available     Review of Systems  Constitutional: Positive for diaphoresis. Negative for fever, chills and unexpected weight change.  Respiratory: Positive for shortness  of breath. Negative for cough.   Cardiovascular: Positive for chest pain. Negative for palpitations and leg swelling.  Gastrointestinal: Positive for nausea. Negative for vomiting, abdominal pain, diarrhea and constipation.  Genitourinary: Negative for dysuria and flank pain.  Musculoskeletal: Negative for back pain.  Skin: Negative for color change and pallor.  Neurological: Positive for light-headedness. Negative for dizziness, syncope and weakness.  All other systems reviewed and are negative.     Allergies  Cyprodenate; Diphenhydramine hcl; Codeine; and Morphine and related  Home Medications   Prior to Admission medications   Medication Sig Start Date End Date Taking? Authorizing Provider  amLODipine (NORVASC) 5 MG tablet TAKE 1 TABLET BY MOUTH EVERY DAY 10/10/15  Yes Josue Hector, MD  fluticasone [facility]) 50 MCG/ACT nasal spray Place 2 sprays into both nostrils daily as needed. For allergies 06/09/15  Yes Historical Provider, MD  omeprazole (PRILOSEC) 40 MG capsule Take 40 mg by mouth daily as needed (heartburn).   Yes Historical Provider, MD   BP 142/80 mmHg  Pulse 64  Temp(Src) 98 F (36.7 C)  Resp 10  Ht 5\' 6"  (1.676 m)  Wt 67.132 kg  BMI 23.90 kg/m2  SpO2 95% Physical Exam  Constitutional: She is oriented to person, place, and time. She appears well-developed and well-nourished. No distress.  HENT:  Head: Normocephalic and atraumatic.  Eyes: Conjunctivae are normal. Pupils are equal, round, and reactive to  light.  Neck: Neck supple.  Cardiovascular: Normal rate, regular rhythm and intact distal pulses.   Murmur heard. Patient has a known aortic stenosis murmur.  Pulmonary/Chest: Effort normal and breath sounds normal. No respiratory distress.  Abdominal: Soft. There is no tenderness. There is no guarding.  Musculoskeletal: She exhibits no edema or tenderness.  Lymphadenopathy:    She has no cervical adenopathy.  Neurological: She is alert and oriented to  person, place, and time.  Skin: Skin is warm and dry. She is not diaphoretic.  Psychiatric: She has a normal mood and affect. Her behavior is normal.  Nursing note and vitals reviewed.   ED Course  Procedures (including critical care time) Labs Review Labs Reviewed  BASIC METABOLIC PANEL - Abnormal; Notable for the following:    Chloride 100 (*)    Glucose, Bld 109 (*)    All other components within normal limits  URINALYSIS, ROUTINE W REFLEX MICROSCOPIC (NOT AT [facility]) - Abnormal; Notable for the following:    Specific Gravity, Urine >1.046 (*)    All other components within normal limits  HEPATIC FUNCTION PANEL - Abnormal; Notable for the following:    Bilirubin, Direct <0.1 (*)    All other components within normal limits  CBC  D-DIMER, QUANTITATIVE (NOT AT [facility])  BRAIN NATRIURETIC PEPTIDE  LIPASE, BLOOD  TROPONIN I  TROPONIN I  TROPONIN I  HEMOGLOBIN A1C  I-STAT TROPOININ, ED    Imaging Review Dg Chest 2 View  02/28/2016  CLINICAL DATA:  Chest pain and shortness of breath. EXAM: CHEST - 2 VIEW COMPARISON:  04/12/2014 FINDINGS: The heart size and mediastinal contours are within normal limits. Chronic lung disease again visualized consisting primarily of interstitial prominence and areas of bronchial thickening. Stable mild hyperinflation bilaterally. Overall appearance of chronic disease suggest mild progression since the prior radiograph. There is no evidence of pulmonary edema, consolidation, pneumothorax, nodule or pleural fluid. The visualized skeletal structures are unremarkable. IMPRESSION: No acute findings. Potential mild progression of chronic lung disease since the prior chest x-ray. Electronically Signed   By: Aletta Edouard M.D.   On: 02/28/2016 08:03   Ct Angio Chest Aorta W/cm &/or Wo/cm  02/28/2016  CLINICAL DATA:  Shortness of breath and left-sided chest pain EXAM: CT ANGIOGRAPHY CHEST WITH CONTRAST TECHNIQUE: Initially, axial CT images were obtained through the  chest without intravenous contrast material administration. Multidetector CT imaging of the chest was performed using the standard protocol during bolus administration of intravenous contrast. Multiplanar CT image reconstructions and MIPs were obtained to evaluate the vascular anatomy. CONTRAST:  90 mL Isovue 370 nonionic COMPARISON:  Chest radiograph Feb 28, 2016 FINDINGS: Mediastinum/Lymph Nodes: There is no demonstrable pulmonary embolus. There is prominence of the at ascending thoracic aorta with a measured transverse diameter 4.4 x 4.3 cm. There is no thoracic aortic dissection. The visualized great vessels appear unremarkable. Pericardium is not appreciably thickened. There is mild left ventricular hypertrophy. There are foci of coronary artery calcification. Thyroid appears normal. There are subcentimeter lymph nodes but no adenopathy by size criteria. Lungs/Pleura: On axial slice 72 series 11, there is a 6 x 4 mm nodular opacity in the superior segment of the right lower lobe. On axial slice 30 series 11, there is a 4 x 4 mm nodular opacity in the anterior segment of the left upper lobe. On this same slice, there is a 2 mm nodular opacity in the anterior segment of the left upper lobe. On axial slice 21 series 11, there  is a 5 x 5 mm nodular opacity in apical segment of the left upper lobe. On axial slice 79 series 11, there is a 4 x 3 mm nodular opacity in posterior segment of the right upper lobe. There is mild lower lobe scarring bilaterally with atelectatic change in the superior segment of the left lower lobe posteriorly. There is slight lower lobe bronchiectatic change bilaterally. There is no frank airspace consolidation. Upper abdomen: In the visualized upper abdomen, there is dilatation of the common bile duct to 13 mm. No mass or calculus is seen in the biliary ductal system by CT. There is no appreciable intrahepatic biliary duct dilatation. Visualized upper abdominal structures otherwise appear  unremarkable. Musculoskeletal: There are no blastic or lytic bone lesions. There are small hemangiomas in several thoracic vertebral bodies. Review of the MIP images confirms the above findings. IMPRESSION: Prominence of the ascending thoracic aorta with a measured diameter of 4.4 x 4.3 cm. Recommend annual imaging followup by CTA or MRA. This recommendation follows 2010 ACCF/AHA/AATS/ACR/ASA/SCA/SCAI/SIR/STS/SVM Guidelines for the Diagnosis and Management of Patients with Thoracic Aortic Disease. Circulation. 2010; [id number]. No thoracic aortic dissection.  No pulmonary embolus. Nodular opacities bilaterally, largest measuring 5 mm. No follow-up needed if patient is low-risk (and has no known or suspected primary neoplasm). Non-contrast chest CT can be considered in 12 months if patient is high-risk. This recommendation follows the consensus statement: Guidelines for Management of Incidental Pulmonary Nodules Detected on CT Images:From the Fleischner Society 2017; published online before print (10.1148/radiol.SG:5268862). No demonstrable adenopathy. Common bile duct dilatation without mass or calculus seen by CT. Etiology for this bile duct dilatation is uncertain. It may be prudent to correlate with MRCP non emergently. There are foci of coronary artery calcification. Electronically Signed   By: Lowella Grip III M.D.   On: 02/28/2016 09:21   I have personally reviewed and evaluated these images and lab results as part of my medical decision-making.   EKG Interpretation   Date/Time:  Saturday Feb 28 2016 06:40:16 EDT Ventricular Rate:  101 PR Interval:  150 QRS Duration: 101 QT Interval:  345 QTC Calculation: 447 R Axis:   43 Text Interpretation:  Sinus tachycardia Probable left atrial enlargement  flipped t wave in aVL seen in  May 06 Otherwise no significant change  Confirmed by FLOYD MD, DANIEL [phone]) on 02/28/2016 6:45:28 AM   EKG Interpretation  Date/Time:  Saturday Feb 28 2016  08:58:37 EDT Ventricular Rate:  85 PR Interval:  152 QRS Duration: 96 QT Interval:  362 QTC Calculation: 430 R Axis:   56 Text Interpretation:  Sinus rhythm LAE, consider biatrial enlargement Probable lateral infarct, old Posterior EKG.  No signs of acute ischemia Confirmed by NGUYEN, EMILY ([id number]) on 02/28/2016 10:02:59 AM        Medications  metoprolol (LOPRESSOR) 5 MG/5ML injection (not administered)  nitroGLYCERIN (NITROSTAT) 0.4 MG SL tablet (not administered)  amLODipine (NORVASC) tablet 5 mg (not administered)  fluticasone (FLONASE) 50 MCG/ACT nasal spray 2 spray (not administered)  pantoprazole (PROTONIX) EC tablet 40 mg (not administered)  acetaminophen (TYLENOL) tablet 650 mg (not administered)  ondansetron (ZOFRAN) injection 4 mg (not administered)  enoxaparin (LOVENOX) injection 40 mg (not administered)  aspirin chewable tablet 324 mg (324 mg Oral Given 02/28/16 0715)  ondansetron (ZOFRAN) injection 4 mg (4 mg Intravenous Given 02/28/16 0718)  HYDROmorphone (DILAUDID) injection 0.5 mg (0.5 mg Intravenous Given 02/28/16 0718)  iopamidol (ISOVUE-370) 76 % injection (80 mLs  Contrast Given 02/28/16 0835)  metoprolol tartrate (LOPRESSOR) tablet 50 mg (50 mg Oral Given 02/28/16 1100)  iopamidol (ISOVUE-370) 76 % injection 80 mL (80 mLs Intravenous Contrast Given 02/28/16 1220)   Orders Placed This Encounter  Procedures  . DG Chest 2 View  . CT ANGIO CHEST AORTA W/CM &/OR WO/CM  . CT Cardiac Morph/Pulm Vein W/Cm&W/O Ca Score  . Basic metabolic panel  . CBC  . D-dimer, quantitative  . Brain natriuretic peptide  . Urinalysis, Routine w reflex microscopic  . Hepatic function panel  . Lipase, blood  . Troponin I-serum (0, 3, 6 hours)  . Lipid panel  . Hemoglobin A1c  . Vital signs with O2 sat q4 hours x 24 hours, then q shift  . Cardiac monitoring  . Contact MD/NPP for elevated troponin or MB and/or MB%  . RN may order Cardiology PRN Orders (through additional orders) for the  following patient needs; indigestion (Maalox)), cough (Robitussin DM), constipation (MOM), diarrhea (Imodium), hemorrhoids (Tucks), or minor skin irritation (Hydrocortisone Cream)  . Heart Score  . Activity as tolerated  . Full code  . Inpatient consult to Cardiology  . Consult to hospitalist  . Pulse oximetry, continuous  . I-stat troponin, ED  . EKG 12-Lead  . ED EKG  . EKG 12-Lead  . EKG 12-Lead  . EKG 12-Lead  . EKG 12-Lead (at 6am)  . Insert peripheral IV  . Place in observation (patient's expected length of stay will be less than 2 midnights)    MDM   Final diagnoses:  Chest pain, unspecified chest pain type  Shortness of breath    [name] presents with chest pain and shortness of breath that arose yesterday, subsided, and then recurred this morning.  Findings and plan of care discussed with Harvel Quale, MD. Dr. Alfonse Spruce personally evaluated and examined this patient.  Patient's presentation is suspicious for ACS, PE, or aortic dissection. HEART score is 4, indicating moderate risk for a cardiac event. Wells criteria score is 3, indicating moderate risk for PE. Pt states she gets nauseous and anxious with morphine, but can take dilaudid. Negative stress test on February 20, 2016. Echo on March 7 endorses aortic stenosis, with no change from previous. Troponin and d-dimer negative. Original EKG showed questionable ST depression in II, III, aVF, as well as V5-V6. Posterior EKG shows no ST elevation or depression in V7-V9. CTA shows no aortic dissection. Aortic prominence, however, is noted with her condition for annual CTA or MRA. Due to her suspicious story, patient to be admitted for chest pain observation. 10:16 AM Spoke with Dr. Johnsie Cancel, Cardiologist, who agreed to come see the patient. 10:54 AM Dr. Johnsie Cancel evaluated the patient, requested 50 mg of Lopressor, admission to telemetry, and a cardiac CT. Dr. Johnsie Cancel will order the CT. Spoke with Tye Savoy, NP with [facility], who agreed to admit the patient to Telemetry observation.  Recommendations: Annual CTA or MRA to monitor the thoracic aorta Repeat noncontrast CT in 12 months to assess lung nodules MRCP not emergently to assess bile duct dilation.  Filed Vitals:   02/28/16 0642 02/28/16 0645 02/28/16 0700 02/28/16 0737  BP: 142/83 142/84 135/81 120/82  Pulse: 97 96 95 86  Temp: 98 F (36.7 C)     Resp: 15 13 16 13   Height: 5\' 6"  (1.676 m)     Weight: 67.132 kg     SpO2: 97% 97% 100% 96%   Filed Vitals:   02/28/16 0800 02/28/16 0815 02/28/16 0900 02/28/16 0934  BP: 118/77 125/78 133/74 120/100  Pulse: 79 89 90 82  Temp:      Resp: 15 16 23 15   Height:      Weight:      SpO2: 94% 95% 97% 93%   Filed Vitals:   02/28/16 1130 02/28/16 1145 02/28/16 1200 02/28/16 1208  BP: 133/93 127/82 122/85 142/80  Pulse: 72 65 62 64  Temp:      Resp: 17 15 12 10   Height:      Weight:      SpO2: 94% 96% 94% 95%     When comparing BP from right and left side: Right: 125/78 Left: 138/84   Lorayne Bender, PA-C 02/28/16 1247  Harvel Quale, MD 03/04/16 [phone]

## 2016-02-28 NOTE — ED Notes (Signed)
Admitting team at bedside.

## 2016-02-28 NOTE — ED Notes (Signed)
Pt. States she woke up at 0500 this morning with left sided chest pressure that radiates to her back. Pt. States she also has sweating and feeling lightheaded at times since the chest pressure started.

## 2016-02-28 NOTE — ED Notes (Signed)
[phone]  [name] (husband)

## 2016-02-28 NOTE — Discharge Summary (Signed)
Physician Discharge Summary  [name] [id number] DOB: [birth year] DOA: 02/28/2016  PCP: Gerrit Heck, MD  Admit date: 02/28/2016 Discharge date: 02/28/2016  Time spent: 45 minutes  Recommendations for Outpatient Follow-up:  Follow up with Cardiologist - Dr. Johnsie Cancel as needed or as he advised you to. Follow up with PCP regarding enlarged common bile duct  Discharge Diagnoses:  Active Problems:   CHEST PAIN, ATYPICAL   Aortic stenosis   GERD (gastroesophageal reflux disease)   Common bile duct dilation  Discharge Condition:  Stable  Diet recommendation: Heart healthy diet  Filed Weights   02/28/16 0642  Weight: 67.132 kg (148 lb)   History of Present Illness: [name] is a 60 y.o. female with medical history significant for, but not necessarily limited to t remote renal cell cancer, GERD, HTN and severe aortic stenosis (Dr. Johnsie Cancel). Patient has no previous history of chest pain but yesterday she developed left-sided chest "discomfort" with radiation through to the back. Pain not pleuritic, not positional, not reminiscent of acid reflux. 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 Denies radiation into neck, jaw or arms. Patient does describe mild shortness of breath associated with the chest discomfort. Discomfort persisted even with rest. Discomfort did resolve, patient slept through the night. She woke up this morning with nausea, diaphoresis, and recurrent left-sided chest discomfort . Patient does occasional heavy lifting at work. She has a strong family history coronary artery disease on her father's side. No other medical complaints  Chest Pain   Resolved with NTG. No evidence for ACS.   Aortic stenosis, stable by recent echo. Followed by Dr. Johnsie Cancel with [place].   CBD dilation. No obvious duct stone. Normal LFTs . CBD 9.9 mm in 2006, measured at 13 mm today. Likely benign process but should probably have abdominal imaging at some  point to exclude pancreatic lesion. This can be further evaluated on outpatient visit with PCP.   Hypertension. Controlled. Continue home anti-hypertensives  GERD, stable. PPI continued upon discharge.    Hospital Course:  In the emergency room the patient was hemodynamically stable. Point-of-care troponin normal, follow-up troponin 5 hours later was also normal. EKG revealed sinus rhythm LAE, consider biatrial enlargement. Patient followed by Dr. Johnsie Cancel [facility]) for severe AS. He saw her in consult while in ED. Cardiac CT scan revealed normal coronaries. Patient was given SL NTG with resolution of chest pain. She was deemed stable for discharge prior to ever transferring to the floor.   Procedures: None  Consultations:  Dr. Johnsie Cancel - [facility]  Discharge Exam: Filed Vitals:   02/28/16 1208 02/28/16 1247  BP: 142/80 112/67  Pulse: 64 73  Temp:    Resp: 10 13    Physical Exam:   See admission note  Discharge Instructions:  Cardiology follow up - Per Dr. Kyla Balzarine recommendations.  Follow up with your primary physician regarding common bile duct dilation seen on CT angiogram.   Discharge Instructions    Diet - low sodium heart healthy    Complete by:  As directed      Discharge instructions    Complete by:  As directed   Follow up with Cardiology as needed     Increase activity slowly    Complete by:  As directed           Current Discharge Medication List    CONTINUE these medications which have NOT CHANGED   Details  amLODipine (NORVASC) 5 MG tablet TAKE 1 TABLET BY MOUTH  EVERY DAY Qty: 90 tablet, Refills: 3    fluticasone (FLONASE) 50 MCG/ACT nasal spray Place 2 sprays into both nostrils daily as needed. For allergies Refills: 1    omeprazole (PRILOSEC) 40 MG capsule Take 40 mg by mouth daily as needed (heartburn).       Allergies  Allergen Reactions  . Cyprodenate Itching  . Diphenhydramine Hcl     Unknown per pt  . Codeine Itching  . Morphine And  Related Nausea Only    Anxiety and Nausea     The results of significant diagnostics from this hospitalization (including imaging, microbiology, ancillary and laboratory) are listed below for reference.    Significant Diagnostic Studies: Dg Chest 2 View  02/28/2016  CLINICAL DATA:  Chest pain and shortness of breath. EXAM: CHEST - 2 VIEW COMPARISON:  04/12/2014 FINDINGS: The heart size and mediastinal contours are within normal limits. Chronic lung disease again visualized consisting primarily of interstitial prominence and areas of bronchial thickening. Stable mild hyperinflation bilaterally. Overall appearance of chronic disease suggest mild progression since the prior radiograph. There is no evidence of pulmonary edema, consolidation, pneumothorax, nodule or pleural fluid. The visualized skeletal structures are unremarkable. IMPRESSION: No acute findings. Potential mild progression of chronic lung disease since the prior chest x-ray. Electronically Signed   By: Aletta Edouard M.D.   On: 02/28/2016 08:03   Ct Cardiac Morph/pulm Vein W/cm&w/o Ca Score  02/28/2016  CLINICAL DATA:  Chest pain EXAM: Cardiac CTA MEDICATIONS: Sub lingual nitro. 4mg  and lopressor 50mg  oral TECHNIQUE: The patient was scanned on a [employer] [id number] slice scanner. Gantry rotation speed was 270 msecs. Collimation was .28mm. A 100 kV prospective scan was triggered in the descending thoracic aorta at 111 HU's with 5% padding centered around 78% of the R-R interval. Average HR during the scan was 68 bpm. The 3D data set was interpreted on a dedicated work station using MPR, MIP and VRT modes. A total of 80cc of contrast was used. FINDINGS: Non-cardiac: See separate report from [facility]. No significant findings on limited lung and soft tissue windows. Calcium Score:  17 punctate area in LM and mid and distal LAD Coronary Arteries: Right dominant with no anomalies LM:  Normal one small area of calcification LAD:  Normal minimal  calcification mid/distal vessel IM:  Normal D1: Normal Circumflex:  Normal Small OM1: Normal RCA:  Dominant and normal PDA: Normal PLA:  Normal IMPRESSION: 1) Calcium score 17 70th percentile for age and sex matched controls 2) Essentially normal right dominant coronary arteries with minimal calcification of LM and mid and distal LAD 3) Aortic root dilatation 4.0 cm in orthogonal planes no dissection Jenkins Rouge Electronically Signed   By: Jenkins Rouge M.D.   On: 02/28/2016 13:45   Ct Angio Chest Aorta W/cm &/or Wo/cm  02/28/2016  CLINICAL DATA:  Shortness of breath and left-sided chest pain EXAM: CT ANGIOGRAPHY CHEST WITH CONTRAST TECHNIQUE: Initially, axial CT images were obtained through the chest without intravenous contrast material administration. Multidetector CT imaging of the chest was performed using the standard protocol during bolus administration of intravenous contrast. Multiplanar CT image reconstructions and MIPs were obtained to evaluate the vascular anatomy. CONTRAST:  90 mL Isovue 370 nonionic COMPARISON:  Chest radiograph Feb 28, 2016 FINDINGS: Mediastinum/Lymph Nodes: There is no demonstrable pulmonary embolus. There is prominence of the at ascending thoracic aorta with a measured transverse diameter 4.4 x 4.3 cm. There is no thoracic aortic dissection. The visualized great vessels appear unremarkable. Pericardium  is not appreciably thickened. There is mild left ventricular hypertrophy. There are foci of coronary artery calcification. Thyroid appears normal. There are subcentimeter lymph nodes but no adenopathy by size criteria. Lungs/Pleura: On axial slice 72 series 11, there is a 6 x 4 mm nodular opacity in the superior segment of the right lower lobe. On axial slice 30 series 11, there is a 4 x 4 mm nodular opacity in the anterior segment of the left upper lobe. On this same slice, there is a 2 mm nodular opacity in the anterior segment of the left upper lobe. On axial slice 21 series 11,  there is a 5 x 5 mm nodular opacity in apical segment of the left upper lobe. On axial slice 79 series 11, there is a 4 x 3 mm nodular opacity in posterior segment of the right upper lobe. There is mild lower lobe scarring bilaterally with atelectatic change in the superior segment of the left lower lobe posteriorly. There is slight lower lobe bronchiectatic change bilaterally. There is no frank airspace consolidation. Upper abdomen: In the visualized upper abdomen, there is dilatation of the common bile duct to 13 mm. No mass or calculus is seen in the biliary ductal system by CT. There is no appreciable intrahepatic biliary duct dilatation. Visualized upper abdominal structures otherwise appear unremarkable. Musculoskeletal: There are no blastic or lytic bone lesions. There are small hemangiomas in several thoracic vertebral bodies. Review of the MIP images confirms the above findings. IMPRESSION: Prominence of the ascending thoracic aorta with a measured diameter of 4.4 x 4.3 cm. Recommend annual imaging followup by CTA or MRA. This recommendation follows 2010 ACCF/AHA/AATS/ACR/ASA/SCA/SCAI/SIR/STS/SVM Guidelines for the Diagnosis and Management of Patients with Thoracic Aortic Disease. Circulation. 2010; 121SP:1689793. No thoracic aortic dissection.  No pulmonary embolus. Nodular opacities bilaterally, largest measuring 5 mm. No follow-up needed if patient is low-risk (and has no known or suspected primary neoplasm). Non-contrast chest CT can be considered in 12 months if patient is high-risk. This recommendation follows the consensus statement: Guidelines for Management of Incidental Pulmonary Nodules Detected on CT Images:From the Fleischner Society 2017; published online before print (10.1148/radiol.SG:5268862). No demonstrable adenopathy. Common bile duct dilatation without mass or calculus seen by CT. Etiology for this bile duct dilatation is uncertain. It may be prudent to correlate with MRCP non  emergently. There are foci of coronary artery calcification. Electronically Signed   By: Lowella Grip III M.D.   On: 02/28/2016 09:21   Labs: Basic Metabolic Panel:  Recent Labs Lab 02/28/16 0709  NA 138  K 4.3  CL 100*  CO2 27  GLUCOSE 109*  BUN 18  CREATININE 0.62  CALCIUM 9.5   Liver Function Tests:  Recent Labs Lab 02/28/16 1037  AST 22  ALT 17  ALKPHOS 75  BILITOT 0.3  PROT 7.4  ALBUMIN 4.1    Recent Labs Lab 02/28/16 1037  LIPASE 36    CBC:  Recent Labs Lab 02/28/16 0709  WBC 5.4  HGB 12.4  HCT 39.4  MCV 89.7  PLT 236   Cardiac Enzymes:  Recent Labs Lab 02/28/16 1217  TROPONINI <0.03   Recent Labs  02/28/16 0729  BNP 13.3    Signed:  Tye Savoy NP  [facility] 02/28/2016, 1:55 PM

## 2016-02-28 NOTE — H&P (Signed)
History and Physical    [name] [id number] DOB: [birth year] DOA: 02/28/2016  Referring MD/NP/PA: EDP -Shawn Joy, P.A. PCP: Gerrit Heck, MD  Outpatient Specialists:  Cardiology - Jenkins Rouge, MD Patient coming from:  Home  Chief Complaint:  chest pain  HPI: [name] is a 59 y.o. female with medical history significant for, but not necessarily limited to t remote renal cell cancer, GERD, HTN and severe aortic stenosis (Dr. Johnsie Cancel). Patient has no previous history of chest pain but yesterday she developed left-sided chest "discomfort" with radiation through to the back. Pain not pleuritic, not positional, not reminiscent of acid reflux. 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 Denies radiation into neck, jaw or arms. Patient does describe mild shortness of breath associated with the chest discomfort. Discomfort persisted even with rest. Discomfort did resolve, patient slept through the night. She woke up this morning with nausea, diaphoresis, and recurrent left-sided chest discomfort . Patient does occasional heavy lifting at work. She has a strong family history coronary artery disease on her father's side. No other medical complaints  ED Course:  Hemodynamically stable.02 sat normal on room air CXR - chronic lung disease, no active process Cbc, LFTs normal.   Trop 0.0 D-dimer 0.34 CTA negative for PE. Prominent ascending thoracic aorta  Review of Systems: As per HPI, otherwise 10 point review of systems negative.    Past Medical History  Diagnosis Date  . Hypertension   . Diverticulitis, colon   . GERD (gastroesophageal reflux disease)   . Chronic kidney disease   . Renal cancer [facility]) 2007    Surgically removed from right kidney    Past Surgical History  Procedure Laterality Date  . Appendectomy    . Hernia repair    . Ovarian cyst surgery       reports that she has never smoked. She has never used smokeless tobacco. She  reports that she does not drink alcohol. Her drug history is not on file.  Allergies  Allergen Reactions  . Cyprodenate Itching  . Diphenhydramine Hcl     Unknown per pt  . Codeine Itching  . Morphine And Related Nausea Only    Anxiety and Nausea     Family History  Problem Relation Age of Onset  . Cancer Mother     Lung  . Cancer Father     Lung  . Hypertension Father   . Cancer Sister     Lung  . Heart attack Paternal Grandfather   . Heart attack Paternal Aunt    Prior to Admission medications   Medication Sig Start Date End Date Taking? Authorizing Provider  amLODipine (NORVASC) 5 MG tablet TAKE 1 TABLET BY MOUTH EVERY DAY 10/10/15  Yes Josue Hector, MD  fluticasone [facility]) 50 MCG/ACT nasal spray Place 2 sprays into both nostrils daily as needed. For allergies 06/09/15  Yes Historical Provider, MD  omeprazole (PRILOSEC) 40 MG capsule Take 40 mg by mouth daily as needed (heartburn).   Yes Historical Provider, MD    Physical Exam: Filed Vitals:   02/28/16 1015 02/28/16 1036 02/28/16 1045 02/28/16 1100  BP: 134/82 123/71 123/82 133/86  Pulse: 90 79 91 90  Temp:      Resp: 24 11 16    Height:      Weight:      SpO2: 97% 94% 97%     Constitutional: NAD, calm, comfortable Filed Vitals:   02/28/16 1015 02/28/16 1036 02/28/16 1045 02/28/16 1100  BP: 134/82 123/71 123/82 133/86  Pulse: 90 79 91 90  Temp:      Resp: 24 11 16    Height:      Weight:      SpO2: 97% 94% 97%    Eyes: PER, lids and conjunctivae normal ENMT: Mucous membranes are moist. Posterior pharynx clear of any exudate or lesions.Normal dentition.  Neck: normal, supple, no masses, Respiratory: clear to auscultation bilaterally, no wheezing, no crackles. Normal respiratory effort. No accessory muscle use.  Cardiovascular: Regular rate and rhythm, loud murmur heard best over right 2nd intercostal. No extremity edema. 2+ pedal pulses. .  Abdomen: no tenderness,  No hepatomegaly. Bowel sounds  positive. Lower midline surgical scar. Just right of midline scar there is an area of firmness,  non-tender .  Musculoskeletal: no clubbing / cyanosis. No joint deformity upper and lower extremities. Good ROM, no contractures. Normal muscle tone. No chest wall tenderness Skin: no rashes, lesions, ulcers. No induration Neurologic: CN 2-12 grossly intact. Sensation intact, DTR normal. Strength 5/5 in all 4.  Psychiatric: Normal judgment and insight. Alert and oriented x 3. Normal mood.   Labs on Admission: I have personally reviewed following labs and imaging studies  CBC:  Recent Labs Lab 02/28/16 0709  WBC 5.4  HGB 12.4  HCT 39.4  MCV 89.7  PLT [id number]   Basic Metabolic Panel:  Recent Labs Lab 02/28/16 0709  NA 138  K 4.3  CL 100*  CO2 27  GLUCOSE 109*  BUN 18  CREATININE 0.62  CALCIUM 9.5   Urine analysis:    Component Value Date/Time   COLORURINE YELLOW 02/28/2016 [place] 02/28/2016 0952   LABSPEC >1.046* 02/28/2016 0952   PHURINE 5.5 02/28/2016 0952   GLUCOSEU NEGATIVE 02/28/2016 [place] 02/28/2016 0952   BILIRUBINUR NEGATIVE 02/28/2016 0952   KETONESUR NEGATIVE 02/28/2016 0952   PROTEINUR NEGATIVE 02/28/2016 0952   UROBILINOGEN 0.2 11/19/2009 1200   NITRITE NEGATIVE 02/28/2016 0952   LEUKOCYTESUR NEGATIVE 02/28/2016 0952   Radiological Exams on Admission: Dg Chest 2 View  02/28/2016  CLINICAL DATA:  Chest pain and shortness of breath. EXAM: CHEST - 2 VIEW COMPARISON:  04/12/2014 FINDINGS: The heart size and mediastinal contours are within normal limits. Chronic lung disease again visualized consisting primarily of interstitial prominence and areas of bronchial thickening. Stable mild hyperinflation bilaterally. Overall appearance of chronic disease suggest mild progression since the prior radiograph. There is no evidence of pulmonary edema, consolidation, pneumothorax, nodule or pleural fluid. The visualized skeletal structures are  unremarkable. IMPRESSION: No acute findings. Potential mild progression of chronic lung disease since the prior chest x-ray. Electronically Signed   By: Aletta Edouard M.D.   On: 02/28/2016 08:03   Ct Angio Chest Aorta W/cm &/or Wo/cm  02/28/2016  CLINICAL DATA:  Shortness of breath and left-sided chest pain EXAM: CT ANGIOGRAPHY CHEST WITH CONTRAST TECHNIQUE: Initially, axial CT images were obtained through the chest without intravenous contrast material administration. Multidetector CT imaging of the chest was performed using the standard protocol during bolus administration of intravenous contrast. Multiplanar CT image reconstructions and MIPs were obtained to evaluate the vascular anatomy. CONTRAST:  90 mL Isovue 370 nonionic COMPARISON:  Chest radiograph Feb 28, 2016 FINDINGS: Mediastinum/Lymph Nodes: There is no demonstrable pulmonary embolus. There is prominence of the at ascending thoracic aorta with a measured transverse diameter 4.4 x 4.3 cm. There is no thoracic aortic dissection. The visualized great vessels appear unremarkable. Pericardium is not appreciably  thickened. There is mild left ventricular hypertrophy. There are foci of coronary artery calcification. Thyroid appears normal. There are subcentimeter lymph nodes but no adenopathy by size criteria. Lungs/Pleura: On axial slice 72 series 11, there is a 6 x 4 mm nodular opacity in the superior segment of the right lower lobe. On axial slice 30 series 11, there is a 4 x 4 mm nodular opacity in the anterior segment of the left upper lobe. On this same slice, there is a 2 mm nodular opacity in the anterior segment of the left upper lobe. On axial slice 21 series 11, there is a 5 x 5 mm nodular opacity in apical segment of the left upper lobe. On axial slice 79 series 11, there is a 4 x 3 mm nodular opacity in posterior segment of the right upper lobe. There is mild lower lobe scarring bilaterally with atelectatic change in the superior segment of the  left lower lobe posteriorly. There is slight lower lobe bronchiectatic change bilaterally. There is no frank airspace consolidation. Upper abdomen: In the visualized upper abdomen, there is dilatation of the common bile duct to 13 mm. No mass or calculus is seen in the biliary ductal system by CT. There is no appreciable intrahepatic biliary duct dilatation. Visualized upper abdominal structures otherwise appear unremarkable. Musculoskeletal: There are no blastic or lytic bone lesions. There are small hemangiomas in several thoracic vertebral bodies. Review of the MIP images confirms the above findings. IMPRESSION: Prominence of the ascending thoracic aorta with a measured diameter of 4.4 x 4.3 cm. Recommend annual imaging followup by CTA or MRA. This recommendation follows 2010 ACCF/AHA/AATS/ACR/ASA/SCA/SCAI/SIR/STS/SVM Guidelines for the Diagnosis and Management of Patients with Thoracic Aortic Disease. Circulation. 2010; 121SP:1689793. No thoracic aortic dissection.  No pulmonary embolus. Nodular opacities bilaterally, largest measuring 5 mm. No follow-up needed if patient is low-risk (and has no known or suspected primary neoplasm). Non-contrast chest CT can be considered in 12 months if patient is high-risk. This recommendation follows the consensus statement: Guidelines for Management of Incidental Pulmonary Nodules Detected on CT Images:From the Fleischner Society 2017; published online before print (10.1148/radiol.SG:5268862). No demonstrable adenopathy. Common bile duct dilatation without mass or calculus seen by CT. Etiology for this bile duct dilatation is uncertain. It may be prudent to correlate with MRCP non emergently. There are foci of coronary artery calcification. Electronically Signed   By: Lowella Grip III M.D.   On: 02/28/2016 09:21   Echo 12/30/15   Study Conclusions  - Left ventricle: The cavity size was normal. Systolic function was  normal. The estimated ejection fraction was in  the range of 55%  to 60%. Wall motion was normal; there were no regional wall  motion abnormalities. Doppler parameters are consistent with  abnormal left ventricular relaxation (grade 1 diastolic  dysfunction). - Aortic valve: Trileaflet; moderately thickened, moderately  calcified leaflets. Valve mobility was restricted. There was  moderate stenosis. There was moderate regurgitation. Peak  velocity (S): 361 cm/s. Mean gradient (S): 28 mm Hg. - Aorta: Aortic root dimension: 38 mm (ED). - Ascending aorta: The ascending aorta was mildly dilated. - Pulmonary arteries: Systolic pressure was mildly increased. PA  peak pressure: 40 mm Hg (S).  Impressions:  - No significant change in aortic stenosis when compared to prior  echocardiogram. (Prior mean gradient 44mmHg).  EKG: Independently reviewed.   EKG Interpretation  Date/Time:  Saturday Feb 28 2016 08:58:37 EDT Ventricular Rate:  85 PR Interval:  152 QRS Duration: 96 QT Interval:  362 QTC Calculation: 430 R Axis:   56 Text Interpretation:  Sinus rhythm LAE, consider biatrial enlargement Probable lateral infarct, old Posterior EKG.  No signs of acute ischemia Confirmed by NGUYEN, EMILY ([id number]) on 02/28/2016 10:02:59 AM    Assessment/Plan  Atypical chest pain. Heart Score 4-5. Recent ETT to evaluate for CAD was normal. POC trop normal. No acute findings on CXR.  CTA chest negative for PE - reveals nodular opacities in both lungs which is likely unrelated to her pain. Differential diagnoses for chest pain:  ACS, GERD, MSK pain.   -  Admit to Observation. Dilaudid helped chest pain but pain recurred requiring Stepdown. She was not given     SL NTG in ED -  given 324mg  ASA in ED.  -  Cycle troponins  -  A1c & lipid panel -  continue daily PPI - takes a home -  Repeat EKG in am -  Cardiology (Dr. Johnsie Cancel) has evaluated. Cardiac CTscan ordered.   Aortic stenosis, moderate to severe. No significant change on echo in March.    Hypertension. Controlled in ED -continue home BP meds.   GERD, asymptomatic. -PPI, she takes one at home  CBD dilation, 93mm on CTA today, it was 9.71mm on u/s in 2006. Her LFTs are normal. No abdominal pain.  No obvious stone in duct. No intrahepatic duct dilation. She hasn't had any recent abdominal imaging.  -outpatient evaluation with MRCP not unreasonable.   Hx of renal cancer, s/p right nephrectomy (remote).  DVT prophylaxis: Lovenox  Code Status: Full code   Family Communication: none Disposition Plan: Discharge Home in 24-48 hours  Consults called: Cardiology - Dr. Johnsie Cancel Admission status: Observation  - Stepdown  Tye Savoy NP [facility] Pager [phone]  If 7PM-7AM, please contact night-coverage [email or web address] Password [email or web address]  02/28/2016, 11:06 AM

## 2016-02-28 NOTE — Consult Note (Signed)
CARDIOLOGY CONSULT NOTE       Patient ID: [name] MRN: [id number] DOB/AGE: 62-Mar-1957 62 y.o.  Admit date: 02/28/2016 Referring Physician:  Alfonse Spruce Primary Physician: Gerrit Heck, MD Primary Cardiologist:  Johnsie Cancel Reason for Consultation: Chest pain  Active Problems:   * No active hospital problems. *   HPI:  60 y.o. in ER with left sided chest pain.  Had some sharp "neuralgia" like pain last night and it continues till this am. Not pleuritic or positional. Left sided radiating to shoulder and arm. Still has some in ER. No change with  Nitro.  She has known moderate to severe AS.  Echo 12/30/15 reviewed  ETT done 01/20/16 was normal  CT with aorta 4.4cm no dissection.  She admits to being nervous about her "heart and valve problem"  No fever , dyspnea Palpitations or syncope.  No recent trauma or unusual physical activity. Did have some nausea and stomach upset that has resolved. No vomiting , GERD or diarrhea .    Study Conclusions  - Left ventricle: The cavity size was normal. Systolic function was  normal. The estimated ejection fraction was in the range of 55%  to 60%. Wall motion was normal; there were no regional wall  motion abnormalities. Doppler parameters are consistent with  abnormal left ventricular relaxation (grade 1 diastolic  dysfunction). - Aortic valve: Trileaflet; moderately thickened, moderately  calcified leaflets. Valve mobility was restricted. There was  moderate stenosis. There was moderate regurgitation. Peak  velocity (S): 361 cm/s. Mean gradient (S): 28 mm Hg. - Aorta: Aortic root dimension: 38 mm (ED). - Ascending aorta: The ascending aorta was mildly dilated. - Pulmonary arteries: Systolic pressure was mildly increased. PA  peak pressure: 40 mm Hg (S).  Impressions:  - No significant change in aortic stenosis when compared to prior  echocardiogram. (Prior mean gradient 79mmHg).  ROS All other systems reviewed and  negative except as noted above  Past Medical History  Diagnosis Date  . Hypertension   . Diverticulitis, colon   . GERD (gastroesophageal reflux disease)   . Chronic kidney disease   . Renal cancer [facility]) 2007    Surgically removed from right kidney    Family History  Problem Relation Age of Onset  . Cancer Mother     Lung  . Cancer Father     Lung  . Hypertension Father   . Cancer Sister     Lung  . Heart attack Paternal Grandfather   . Heart attack Paternal Aunt     Social History   Social History  . Marital Status: Married    Spouse Name: N/A  . Number of Children: N/A  . Years of Education: N/A   Occupational History  . Not on file.   Social History Main Topics  . Smoking status: Never Smoker   . Smokeless tobacco: Never Used  . Alcohol Use: No  . Drug Use: Not on file  . Sexual Activity: Not on file   Other Topics Concern  . Not on file   Social History Narrative    Past Surgical History  Procedure Laterality Date  . Appendectomy    . Hernia repair    . Ovarian cyst surgery          Physical Exam: Blood pressure 133/86, pulse 90, temperature 98 F (36.7 C), resp. rate 16, height 5\' 6"  (1.676 m), weight 67.132 kg (148 lb), SpO2 97 %.    Affect appropriate Anxious white female  HEENT: normal  Neck supple with no adenopathy JVP normal no bruits no thyromegaly Lungs clear with no wheezing and good diaphragmatic motion Heart:  S1/S2 significant AS murmur  murmur, no rub, gallop or click PMI normal Abdomen: benighn, BS positve, no tenderness, no AAA no bruit.  No HSM or HJR Distal pulses intact with no bruits No edema Neuro non-focal Skin warm and dry No muscular weakness    Labs:   Lab Results  Component Value Date   WBC 5.4 02/28/2016   HGB 12.4 02/28/2016   HCT 39.4 02/28/2016   MCV 89.7 02/28/2016   PLT 236 02/28/2016    Recent Labs Lab 02/28/16 0709  NA 138  K 4.3  CL 100*  CO2 27  BUN 18  CREATININE 0.62  CALCIUM 9.5    GLUCOSE 109*      Radiology: Dg Chest 2 View  02/28/2016  CLINICAL DATA:  Chest pain and shortness of breath. EXAM: CHEST - 2 VIEW COMPARISON:  04/12/2014 FINDINGS: The heart size and mediastinal contours are within normal limits. Chronic lung disease again visualized consisting primarily of interstitial prominence and areas of bronchial thickening. Stable mild hyperinflation bilaterally. Overall appearance of chronic disease suggest mild progression since the prior radiograph. There is no evidence of pulmonary edema, consolidation, pneumothorax, nodule or pleural fluid. The visualized skeletal structures are unremarkable. IMPRESSION: No acute findings. Potential mild progression of chronic lung disease since the prior chest x-ray. Electronically Signed   By: Aletta Edouard M.D.   On: 02/28/2016 08:03   Ct Angio Chest Aorta W/cm &/or Wo/cm  02/28/2016  CLINICAL DATA:  Shortness of breath and left-sided chest pain EXAM: CT ANGIOGRAPHY CHEST WITH CONTRAST TECHNIQUE: Initially, axial CT images were obtained through the chest without intravenous contrast material administration. Multidetector CT imaging of the chest was performed using the standard protocol during bolus administration of intravenous contrast. Multiplanar CT image reconstructions and MIPs were obtained to evaluate the vascular anatomy. CONTRAST:  90 mL Isovue 370 nonionic COMPARISON:  Chest radiograph Feb 28, 2016 FINDINGS: Mediastinum/Lymph Nodes: There is no demonstrable pulmonary embolus. There is prominence of the at ascending thoracic aorta with a measured transverse diameter 4.4 x 4.3 cm. There is no thoracic aortic dissection. The visualized great vessels appear unremarkable. Pericardium is not appreciably thickened. There is mild left ventricular hypertrophy. There are foci of coronary artery calcification. Thyroid appears normal. There are subcentimeter lymph nodes but no adenopathy by size criteria. Lungs/Pleura: On axial slice 72  series 11, there is a 6 x 4 mm nodular opacity in the superior segment of the right lower lobe. On axial slice 30 series 11, there is a 4 x 4 mm nodular opacity in the anterior segment of the left upper lobe. On this same slice, there is a 2 mm nodular opacity in the anterior segment of the left upper lobe. On axial slice 21 series 11, there is a 5 x 5 mm nodular opacity in apical segment of the left upper lobe. On axial slice 79 series 11, there is a 4 x 3 mm nodular opacity in posterior segment of the right upper lobe. There is mild lower lobe scarring bilaterally with atelectatic change in the superior segment of the left lower lobe posteriorly. There is slight lower lobe bronchiectatic change bilaterally. There is no frank airspace consolidation. Upper abdomen: In the visualized upper abdomen, there is dilatation of the common bile duct to 13 mm. No mass or calculus is seen in the biliary ductal system by CT. There  is no appreciable intrahepatic biliary duct dilatation. Visualized upper abdominal structures otherwise appear unremarkable. Musculoskeletal: There are no blastic or lytic bone lesions. There are small hemangiomas in several thoracic vertebral bodies. Review of the MIP images confirms the above findings. IMPRESSION: Prominence of the ascending thoracic aorta with a measured diameter of 4.4 x 4.3 cm. Recommend annual imaging followup by CTA or MRA. This recommendation follows 2010 ACCF/AHA/AATS/ACR/ASA/SCA/SCAI/SIR/STS/SVM Guidelines for the Diagnosis and Management of Patients with Thoracic Aortic Disease. Circulation. 2010; [id number]. No thoracic aortic dissection.  No pulmonary embolus. Nodular opacities bilaterally, largest measuring 5 mm. No follow-up needed if patient is low-risk (and has no known or suspected primary neoplasm). Non-contrast chest CT can be considered in 12 months if patient is high-risk. This recommendation follows the consensus statement: Guidelines for Management of  Incidental Pulmonary Nodules Detected on CT Images:From the Fleischner Society 2017; published online before print (10.1148/radiol.IJ:2314499). No demonstrable adenopathy. Common bile duct dilatation without mass or calculus seen by CT. Etiology for this bile duct dilatation is uncertain. It may be prudent to correlate with MRCP non emergently. There are foci of coronary artery calcification. Electronically Signed   By: Lowella Grip III M.D.   On: 02/28/2016 09:21    EKG: NSR no acute changes    ASSESSMENT AND PLAN:   Chest Pain:  Atypical given severity of AS she had a recent ETT that was normal to r/o CAD. However she now presents to ED with persistent SSCP.  Unfortunately already had a non gated CT.  Aorta is mildly dilated Options include cardiac CTA, myovue, or cath. In regard to early d/c from ER and lower radiation dose a prospective gated CTA with 100kV would be best option. She will get beta blocker and 18 gage iv Have spoken With radiology techs and try to do at noon.  If coronary arteries look good can d/c from ER  AS:  Due for f/u echo in 6 months.  Would likely need Bental if gradients progress to severe stenosis.  Again given severity of AS important to r/o CAD HTN:  Well controlled.  Continue current medications and low sodium Dash type diet.     SignedJenkins Rouge 02/28/2016, 11:07 AM

## 2016-03-10 DIAGNOSIS — K838 Other specified diseases of biliary tract: Secondary | ICD-10-CM | POA: Diagnosis not present

## 2016-03-10 DIAGNOSIS — I350 Nonrheumatic aortic (valve) stenosis: Secondary | ICD-10-CM | POA: Diagnosis not present

## 2016-03-10 DIAGNOSIS — K219 Gastro-esophageal reflux disease without esophagitis: Secondary | ICD-10-CM | POA: Diagnosis not present

## 2016-03-10 DIAGNOSIS — R918 Other nonspecific abnormal finding of lung field: Secondary | ICD-10-CM | POA: Diagnosis not present

## 2016-04-05 DIAGNOSIS — R932 Abnormal findings on diagnostic imaging of liver and biliary tract: Secondary | ICD-10-CM | POA: Diagnosis not present

## 2016-04-21 ENCOUNTER — Ambulatory Visit (INDEPENDENT_AMBULATORY_CARE_PROVIDER_SITE_OTHER): Payer: Federal, State, Local not specified - PPO | Admitting: Emergency Medicine

## 2016-04-21 ENCOUNTER — Encounter: Payer: Self-pay | Admitting: Emergency Medicine

## 2016-04-21 VITALS — BP 128/72 | HR 94 | Ht 66.0 in | Wt 156.2 lb

## 2016-04-21 DIAGNOSIS — R918 Other nonspecific abnormal finding of lung field: Secondary | ICD-10-CM | POA: Diagnosis not present

## 2016-04-21 DIAGNOSIS — R911 Solitary pulmonary nodule: Secondary | ICD-10-CM | POA: Diagnosis not present

## 2016-04-21 NOTE — Patient Instructions (Signed)
We will perform a repeat CT chest in November, no contrast, high res cuts, to follow pulmonary nodules.  Follow with Dr Lamonte Sakai in 6 months or sooner if you have any problems If you notice any new respiratory symptoms (changes in breathing, cough, mucous etc) then contact our office and we will decide whether to repeat your Ct scan sooner.

## 2016-04-21 NOTE — Progress Notes (Signed)
Subjective:    Patient ID: [name], female    DOB: [birth year], 60 y.o.   MRN: [id number]  HPI 60 year old woman, former smoker, history of hypertension, GERD, chronic renal insufficiency in the setting of a prior renal cell carcinoma and a right nephrectomy. She is followed by Dr Johnsie Cancel for HTN, palpitations. She had episode of CP and underwent CT-PA 02/28/16 that shows several very small pulm nodules as described below and prominent ascending thoracic aorta . Then she had a cardiac CT scan that was reassuring. She is referred today to follow her pulmonary nodules.   Father with hx lung CA, treated surgically. Both her sister and mother had lung CA as well.  No hx RA, SLE or autoimmune disease.    Review of Systems  Constitutional: Negative for fever and unexpected weight change.  HENT: Negative for congestion, dental problem, ear pain, nosebleeds, postnasal drip, rhinorrhea, sinus pressure, sneezing, sore throat and trouble swallowing.   Eyes: Negative for redness and itching.  Respiratory: Negative for cough, chest tightness, shortness of breath and wheezing.   Cardiovascular: Negative for palpitations and leg swelling.  Gastrointestinal: Negative for nausea and vomiting.  Genitourinary: Negative for dysuria.  Musculoskeletal: Negative for joint swelling.  Skin: Negative for rash.  Neurological: Negative for headaches.  Hematological: Does not bruise/bleed easily.  Psychiatric/Behavioral: Negative for dysphoric mood. The patient is not nervous/anxious.     Past Medical History  Diagnosis Date  . Hypertension   . Diverticulitis, colon   . GERD (gastroesophageal reflux disease)   . Chronic kidney disease   . Renal cancer [facility]) 2007    Surgically removed from right kidney     Family History  Problem Relation Age of Onset  . Cancer Mother     Lung  . Cancer Father     Lung  . Hypertension Father   . Cancer Sister     Lung  . Heart attack Paternal Grandfather   .  Heart attack Paternal Aunt      Social History   Social History  . Marital Status: Married    Spouse Name: N/A  . Number of Children: N/A  . Years of Education: N/A   Occupational History  . Not on file.   Social History Main Topics  . Smoking status: Former Smoker -- 1.00 packs/day for 10 years  . Smokeless tobacco: Never Used     Comment: quit around 2007   . Alcohol Use: Yes     Comment: occ  . Drug Use: No  . Sexual Activity: Not on file   Other Topics Concern  . Not on file   Social History Narrative     Allergies  Allergen Reactions  . Cyprodenate Itching  . Diphenhydramine Hcl     Unknown per pt  . Codeine Itching  . Morphine And Related Nausea Only    Anxiety and Nausea      Outpatient Prescriptions Prior to Visit  Medication Sig Dispense Refill  . amLODipine (NORVASC) 5 MG tablet TAKE 1 TABLET BY MOUTH EVERY DAY 90 tablet 3  . fluticasone (FLONASE) 50 MCG/ACT nasal spray Place 2 sprays into both nostrils daily as needed. For allergies  1  . omeprazole (PRILOSEC) 40 MG capsule Take 40 mg by mouth daily as needed (heartburn).     No facility-administered medications prior to visit.         Objective:   Physical Exam Filed Vitals:   04/21/16 1614  BP: 128/72  Pulse: 94  Height: 5\' 6"  (1.676 m)  Weight: 156 lb 3.2 oz (70.852 kg)  SpO2: 97%   Gen: Pleasant, well-nourished, in no distress,  normal affect  ENT: No lesions,  mouth clear,  oropharynx clear, no postnasal drip  Neck: No JVD, no TMG, no carotid bruits  Lungs: No use of accessory muscles, clear without rales or rhonchi  Cardiovascular: RRR, heart sounds normal, no murmur or gallops, no peripheral edema  Musculoskeletal: No deformities, no cyanosis or clubbing  Neuro: alert, non focal  Skin: Warm, no lesions or rashes     Assessment & Plan:  Pulmonary nodules/lesions, multiple Several small pulmonary nodules that range in size from 2 mm to approximately 5 mm. Etiology  unclear. No history to suggest autoimmune disease or infection. Based on guidelines you can make an argument to defer follow-up but given her history of renal cell carcinoma, strong family history of lung cancer, I believe that this needs to be followed with serial CT scans as in a patient with a higher smoking history. We will recheck her CT in 6 months and look for interval change.   Baltazar Apo, MD, PhD 04/21/2016, 5:13 PM [facility] [phone] or if no answer [phone]

## 2016-04-21 NOTE — Assessment & Plan Note (Signed)
Several small pulmonary nodules that range in size from 2 mm to approximately 5 mm. Etiology unclear. No history to suggest autoimmune disease or infection. Based on guidelines you can make an argument to defer follow-up but given her history of renal cell carcinoma, strong family history of lung cancer, I believe that this needs to be followed with serial CT scans as in a patient with a higher smoking history. We will recheck her CT in 6 months and look for interval change.

## 2016-04-22 ENCOUNTER — Institutional Professional Consult (permissible substitution): Payer: Federal, State, Local not specified - PPO | Admitting: Emergency Medicine

## 2016-07-15 NOTE — Progress Notes (Signed)
Patient ID: [name], female   DOB: 1956/09/07, 60 y.o.   MRN: [id number]   60 y.o. female first seen in 2014  self referral for her palpitations and hypertension. She was previously seen in 2010  SHe was intolerant of Toprol. No problems with higher dose calcium channel blockers for her palpitations .  Palpitations are improved. Blood pressures improved. They've a lot of her symptoms are related to anxiety state. Seems to worry about a lot of things. She she use to see a a cancer doctor at [place].  Had renal cancer. Sees Dr Drema Dallas now with [facility]. Not any significant chest pain or dyspnea.She has a 39-year-old and a 13  year-old grandchild. 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 He tries to follow a low-sodium diet.  Echo 12/30/15   Study Conclusions  - Left ventricle: The cavity size was normal. Systolic function was  normal. The estimated ejection fraction was in the range of 55%  to 60%. Wall motion was normal; there were no regional wall  motion abnormalities. Doppler parameters are consistent with  abnormal left ventricular relaxation (grade 1 diastolic  dysfunction). - Aortic valve: Trileaflet; moderately thickened, moderately  calcified leaflets. Valve mobility was restricted. There was  moderate stenosis. There was moderate regurgitation. Peak  velocity (S): 361 cm/s. Mean gradient (S): 28 mm Hg. - Aorta: Aortic root dimension: 38 mm (ED). - Ascending aorta: The ascending aorta was mildly dilated. - Pulmonary arteries: Systolic pressure was mildly increased. PA  peak pressure: 40 mm Hg (S).  Impressions:  - No significant change in aortic stenosis when compared to prior  echocardiogram. (Prior mean gradient 43mmHg).  Carotids with plaque no stenosis as murmur radiated to neck   ETT:  01/20/16 normal  Max HR 157 10.9 METS  9:30 on Bruce protocol   She had CT in hospital 02/28/16 to r/o CAD and pulmonary nodules noted Family history of lung cancer Seen by  [facility] and f/u hi res CT schedule For December   Daughter is having baby today This will be her 3rd grand child   ROS: Denies fever, malais, weight loss, blurry vision, decreased visual acuity, cough, sputum, SOB, hemoptysis, pleuritic pain, palpitaitons, heartburn, abdominal pain, melena, lower extremity edema, claudication, or rash.  All other systems reviewed and negative   General: Affect appropriate Healthy:  appears stated age 47: normal Neck supple with no adenopathy JVP normal no bruits no thyromegaly Lungs clear with no wheezing and good diaphragmatic motion Heart:  S1/S2 3/6 SEM of AS murmur,rub, gallop or click PMI normal Abdomen: benighn, BS positve, no tenderness, no AAA no bruit.  No HSM or HJR Distal pulses intact with no bruits No edema Neuro non-focal Skin warm and dry No muscular weakness  Medications Current Outpatient Prescriptions  Medication Sig Dispense Refill  . amLODipine (NORVASC) 5 MG tablet TAKE 1 TABLET BY MOUTH EVERY DAY 90 tablet 3  . fluticasone (FLONASE) 50 MCG/ACT nasal spray Place 2 sprays into both nostrils daily as needed. For allergies  1  . omeprazole (PRILOSEC) 40 MG capsule Take 40 mg by mouth daily as needed (heartburn).     No current facility-administered medications for this visit.     Allergies Cyprodenate; Diphenhydramine hcl; Codeine; and Morphine and related  Family History: Family History  Problem Relation Age of Onset  . Cancer Mother     Lung  . Cancer Father     Lung  . Hypertension Father   . Cancer Sister  Lung  . Heart attack Paternal Grandfather   . Heart attack Paternal Aunt     Social History: Social History   Social History  . Marital status: Married    Spouse name: N/A  . Number of children: N/A  . Years of education: N/A   Occupational History  . Not on file.   Social History Main Topics  . Smoking status: Former Smoker    Packs/day: 1.00    Years: 10.00  . Smokeless tobacco: Never  Used     Comment: quit around 2007   . Alcohol use Yes     Comment: occ  . Drug use: No  . Sexual activity: Not on file   Other Topics Concern  . Not on file   Social History Narrative  . No narrative on file    Electrocardiogram:  Low atrial focus regular rhyhthm rate 93 normal QRS complexes  08/26/15  SR rate 78 normal   Assessment and Plan  Palpitations :  Resolved monitor HTN:  Well controlled.  Continue current medications and low sodium Dash type diet.   AS:  Moderate  Active walks 10 miles/day f/u echo in a year   CAD none and normal ETT 01/20/16 with good HR/BP response  Lung Nodule: with family history of lung cancer Non smoker f/u hi res CT December And f/u [facility]   F/U with me in  6 months with echo    Jenkins Rouge

## 2016-07-22 ENCOUNTER — Ambulatory Visit (INDEPENDENT_AMBULATORY_CARE_PROVIDER_SITE_OTHER): Payer: Federal, State, Local not specified - PPO | Admitting: Cardiovascular Disease

## 2016-07-22 ENCOUNTER — Encounter: Payer: Self-pay | Admitting: Cardiovascular Disease

## 2016-07-22 VITALS — BP 140/70 | HR 96 | Ht 66.0 in | Wt 161.2 lb

## 2016-07-22 DIAGNOSIS — R011 Cardiac murmur, unspecified: Secondary | ICD-10-CM | POA: Diagnosis not present

## 2016-07-22 DIAGNOSIS — I350 Nonrheumatic aortic (valve) stenosis: Secondary | ICD-10-CM

## 2016-07-22 NOTE — Patient Instructions (Addendum)
Medication Instructions:  Your physician recommends that you continue on your current medications as directed. Please refer to the Current Medication list given to you today.  Labwork: NONE  Testing/Procedures: Lung CT scanning in December ordered by Dr. Lamonte Sakai. (CAT scanning), is a noninvasive, special x-ray that produces cross-sectional images of the body using x-rays and a computer. CT scans help physicians diagnose and treat medical conditions. For some CT exams, a contrast material is used to enhance visibility in the area of the body being studied. CT scans provide greater clarity and reveal more details than regular x-ray exams.  Your physician has requested that you have an echocardiogram in March . Echocardiography is a painless test that uses sound waves to create images of your heart. It provides your doctor with information about the size and shape of your heart and how well your heart's chambers and valves are working. This procedure takes approximately one hour. There are no restrictions for this procedure.  Follow-Up: Your physician wants you to follow-up in: March  with Dr. Johnsie Cancel. You will receive a reminder letter in the mail two months in advance. If you don't receive a letter, please call our office to schedule the follow-up appointment.   If you need a refill on your cardiac medications before your next appointment, please call your pharmacy.

## 2016-08-02 DIAGNOSIS — R05 Cough: Secondary | ICD-10-CM | POA: Diagnosis not present

## 2016-08-09 DIAGNOSIS — Z23 Encounter for immunization: Secondary | ICD-10-CM | POA: Diagnosis not present

## 2016-09-20 ENCOUNTER — Ambulatory Visit (INDEPENDENT_AMBULATORY_CARE_PROVIDER_SITE_OTHER)
Admission: RE | Admit: 2016-09-20 | Discharge: 2016-09-20 | Disposition: A | Discharge status: Home / Self Care | Payer: Federal, State, Local not specified - PPO | Source: Ambulatory Visit | Attending: Emergency Medicine | Admitting: Emergency Medicine

## 2016-09-20 DIAGNOSIS — R911 Solitary pulmonary nodule: Secondary | ICD-10-CM

## 2016-09-20 DIAGNOSIS — R918 Other nonspecific abnormal finding of lung field: Secondary | ICD-10-CM | POA: Diagnosis not present

## 2016-09-27 ENCOUNTER — Telehealth: Payer: Self-pay | Admitting: Emergency Medicine

## 2016-09-27 DIAGNOSIS — R918 Other nonspecific abnormal finding of lung field: Secondary | ICD-10-CM

## 2016-09-27 NOTE — Telephone Encounter (Signed)
Pt requesting ct results from 11/27.  RB please advise.   Thanks!

## 2016-09-30 NOTE — Telephone Encounter (Signed)
MPRESSION: 1. No evidence of interstitial lung disease. 2. Scattered pulmonary nodules measure 4 mm or less in size and are unchanged from 02/28/2016. 3. Ascending aortic aneurysm, stable. Recommend annual imaging followup by CTA or MRA. This recommendation follows 2010 ACCF/AHA/AATS/ACR/ASA/SCA/SCAI/SIR/STS/SVM Guidelines for the Diagnosis and Management of Patients with Thoracic Aortic Disease. Circulation. 2010; 121: [id number]. 4. Aortic atherosclerosis (ICD10-170.0). Coronary artery calcification.   Electronically Signed   By: Lorin Picket M.D.   On: 09/20/2016 12:52     RB Please advise. Thanks.

## 2016-10-04 NOTE — Telephone Encounter (Signed)
[phone] pt calling back

## 2016-10-04 NOTE — Telephone Encounter (Signed)
Please let her know that her pulmonary nodules are unchanged. This is good news. She will need a repeat scan in 1 year.

## 2016-10-04 NOTE — Telephone Encounter (Signed)
Lmtcb. Will await call back 

## 2016-10-04 NOTE — Telephone Encounter (Signed)
Pt aware of results and voiced understanding. Pt is scheduled to follow up with RB on 10/12/16 and would like to know if she should keep this appointment, as she is not having any pulmonary problems.    RB please advise. Thanks.

## 2016-10-05 ENCOUNTER — Other Ambulatory Visit: Payer: Self-pay | Admitting: Cardiovascular Disease

## 2016-10-05 ENCOUNTER — Other Ambulatory Visit: Payer: Self-pay

## 2016-10-05 DIAGNOSIS — R918 Other nonspecific abnormal finding of lung field: Secondary | ICD-10-CM

## 2016-10-05 NOTE — Telephone Encounter (Signed)
Spoke with pt, she stated she wants to cancel her appt. I cancelled the appt for the pt.

## 2016-10-05 NOTE — Telephone Encounter (Signed)
She can cancel that visit. See me in one year with a repeat Ct chest (no contrast) prior in  08/2017

## 2016-10-12 ENCOUNTER — Ambulatory Visit: Payer: Federal, State, Local not specified - PPO | Admitting: Emergency Medicine

## 2016-11-17 DIAGNOSIS — L309 Dermatitis, unspecified: Secondary | ICD-10-CM | POA: Diagnosis not present

## 2016-11-29 NOTE — Progress Notes (Signed)
Patient ID: [name], female   DOB: 1955-11-09, 61 y.o.   MRN: [id number]   60 y.o. female first seen in 2014  self referral for her palpitations and hypertension. She was previously seen in 2010  SHe was intolerant of Toprol. No problems with higher dose calcium channel blockers for her palpitations .  Palpitations are improved. Blood pressures improved. They've a lot of her symptoms are related to anxiety state. Seems to worry about a lot of things. She she use to see a a cancer doctor at [place].  Had renal cancer. Sees Dr Drema Dallas now with [facility]. Not any significant chest pain or dyspnea.She has a 55-year-old and a 32  year-old grandchild. 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 He tries to follow a low-sodium diet.  Echo 12/30/15   Study Conclusions  - Left ventricle: The cavity size was normal. Systolic function was  normal. The estimated ejection fraction was in the range of 55%  to 60%. Wall motion was normal; there were no regional wall  motion abnormalities. Doppler parameters are consistent with  abnormal left ventricular relaxation (grade 1 diastolic  dysfunction). - Aortic valve: Trileaflet; moderately thickened, moderately  calcified leaflets. Valve mobility was restricted. There was  moderate stenosis. There was moderate regurgitation. Peak  velocity (S): 361 cm/s. Mean gradient (S): 28 mm Hg. - Aorta: Aortic root dimension: 38 mm (ED). - Ascending aorta: The ascending aorta was mildly dilated. - Pulmonary arteries: Systolic pressure was mildly increased. PA  peak pressure: 40 mm Hg (S).  Impressions:  - No significant change in aortic stenosis when compared to prior  echocardiogram. (Prior mean gradient 76mmHg).  Carotids with plaque no stenosis as murmur radiated to neck   ETT:  01/20/16 normal  Max HR 157 10.9 METS  9:30 on Bruce protocol   She had CT in hospital 02/28/16 to r/o CAD and pulmonary nodules noted Family history of lung cancer Seen by  [facility] and f/u hi res CT schedule For December    ROS: Denies fever, malais, weight loss, blurry vision, decreased visual acuity, cough, sputum, SOB, hemoptysis, pleuritic pain, palpitaitons, heartburn, abdominal pain, melena, lower extremity edema, claudication, or rash.  All other systems reviewed and negative   General: Affect appropriate Healthy:  appears stated age 39: normal Neck supple with no adenopathy JVP normal no bruits no thyromegaly Lungs clear with no wheezing and good diaphragmatic motion Heart:  S1/S2 3/6 SEM of AS murmur,rub, gallop or click PMI normal Abdomen: benighn, BS positve, no tenderness, no AAA no bruit.  No HSM or HJR Distal pulses intact with no bruits No edema Neuro non-focal Skin warm and dry No muscular weakness  Medications Current Outpatient Prescriptions  Medication Sig Dispense Refill  . amLODipine (NORVASC) 5 MG tablet TAKE 1 TABLET BY MOUTH EVERY DAY 90 tablet 3  . fluticasone (FLONASE) 50 MCG/ACT nasal spray Place 2 sprays into both nostrils daily as needed. For allergies  1  . omeprazole (PRILOSEC) 40 MG capsule Take 40 mg by mouth daily as needed (heartburn).     No current facility-administered medications for this visit.     Allergies Cyprodenate; Diphenhydramine hcl; Codeine; and Morphine and related  Family History: Family History  Problem Relation Age of Onset  . Cancer Mother     Lung  . Cancer Father     Lung  . Hypertension Father   . Cancer Sister     Lung  . Heart attack Paternal Grandfather   . Heart  attack Paternal Aunt     Social History: Social History   Social History  . Marital status: Married    Spouse name: N/A  . Number of children: N/A  . Years of education: N/A   Occupational History  . Not on file.   Social History Main Topics  . Smoking status: Former Smoker    Packs/day: 1.00    Years: 10.00  . Smokeless tobacco: Never Used     Comment: quit around 2007   . Alcohol use Yes      Comment: occ  . Drug use: No  . Sexual activity: Not on file   Other Topics Concern  . Not on file   Social History Narrative  . No narrative on file    Electrocardiogram:  Low atrial focus regular rhyhthm rate 93 normal QRS complexes  08/26/15  SR rate 78 normal   Assessment and Plan  Palpitations :  Resolved monitor HTN:  Well controlled.  Continue current medications and low sodium Dash type diet.   AS:  Moderate  Active walks 10 miles/day f/u echo March 2018  CAD none and normal ETT 01/20/16 with good HR/BP response  Lung Nodule: with family history of lung cancer Non smoker f/u hi res CT 09/20/16 ok  And f/u [facility]   Aorta:  4.0 cm by CT can follow with her screening CT;s and ascending root by echo  Bruit:  ? Transferred murmur from AS  12/2015 plaque no stenosis will repeat   F/U with me in  6 months with echo    Jenkins Rouge

## 2016-12-03 ENCOUNTER — Encounter (INDEPENDENT_AMBULATORY_CARE_PROVIDER_SITE_OTHER): Payer: Self-pay

## 2016-12-03 ENCOUNTER — Ambulatory Visit (INDEPENDENT_AMBULATORY_CARE_PROVIDER_SITE_OTHER): Payer: Federal, State, Local not specified - PPO | Admitting: Cardiovascular Disease

## 2016-12-03 ENCOUNTER — Encounter: Payer: Self-pay | Admitting: Cardiovascular Disease

## 2016-12-03 VITALS — BP 130/70 | HR 91 | Ht 66.0 in | Wt 160.4 lb

## 2016-12-03 DIAGNOSIS — R011 Cardiac murmur, unspecified: Secondary | ICD-10-CM

## 2016-12-03 DIAGNOSIS — I350 Nonrheumatic aortic (valve) stenosis: Secondary | ICD-10-CM

## 2016-12-03 DIAGNOSIS — R0989 Other specified symptoms and signs involving the circulatory and respiratory systems: Secondary | ICD-10-CM

## 2016-12-03 NOTE — Patient Instructions (Addendum)
Medication Instructions:  Your physician recommends that you continue on your current medications as directed. Please refer to the Current Medication list given to you today.  Labwork: NONE  Testing/Procedures: Your physician has requested that you have a carotid duplex in March. This test is an ultrasound of the carotid arteries in your neck. It looks at blood flow through these arteries that supply the brain with blood. Allow one hour for this exam. There are no restrictions or special instructions.  Follow-Up: Your physician wants you to follow-up in: March with Dr. Johnsie Cancel after test completed.    If you need a refill on your cardiac medications before your next appointment, please call your pharmacy.

## 2016-12-24 ENCOUNTER — Other Ambulatory Visit: Payer: Self-pay

## 2016-12-24 ENCOUNTER — Ambulatory Visit (HOSPITAL_BASED_OUTPATIENT_CLINIC_OR_DEPARTMENT_OTHER): Payer: Federal, State, Local not specified - PPO

## 2016-12-24 ENCOUNTER — Ambulatory Visit (HOSPITAL_COMMUNITY)
Admission: RE | Admit: 2016-12-24 | Discharge: 2016-12-24 | Disposition: A | Discharge status: Home / Self Care | Payer: Federal, State, Local not specified - PPO | Source: Ambulatory Visit | Attending: Cardiovascular Disease | Admitting: Cardiovascular Disease

## 2016-12-24 DIAGNOSIS — I6523 Occlusion and stenosis of bilateral carotid arteries: Secondary | ICD-10-CM | POA: Diagnosis not present

## 2016-12-24 DIAGNOSIS — I080 Rheumatic disorders of both mitral and aortic valves: Secondary | ICD-10-CM | POA: Diagnosis not present

## 2016-12-24 DIAGNOSIS — R011 Cardiac murmur, unspecified: Secondary | ICD-10-CM | POA: Diagnosis not present

## 2016-12-24 DIAGNOSIS — I350 Nonrheumatic aortic (valve) stenosis: Secondary | ICD-10-CM

## 2016-12-24 DIAGNOSIS — R0989 Other specified symptoms and signs involving the circulatory and respiratory systems: Secondary | ICD-10-CM

## 2016-12-29 ENCOUNTER — Telehealth: Payer: Self-pay | Admitting: Cardiovascular Disease

## 2016-12-29 NOTE — Telephone Encounter (Signed)
I spoke with pt and reviewed carotid doppler results with her.  

## 2016-12-29 NOTE — Telephone Encounter (Signed)
New Message    Returning Pam's call from earlier today for results from Carotid

## 2017-01-03 NOTE — Progress Notes (Signed)
Patient ID: [name], female   DOB: 1956-09-21, 61 y.o.   MRN: [id number]   60 y.o. female first seen in 2014  self referral for her palpitations and hypertension. She was previously seen in 2010  SHe was intolerant of Toprol. No problems with higher dose calcium channel blockers for her palpitations .  Palpitations are improved. Blood pressures improved. They've a lot of her symptoms are related to anxiety state. Seems to worry about a lot of things. She she use to see a a cancer doctor at [place].  Had renal cancer. Sees Dr Drema Dallas now with [facility]. Not any significant chest pain or dyspnea.She has a 65-year-old and a 25 year-old grandchild. 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 He tries to follow a low-sodium diet.  Echo 12/24/16 personally reviewed:   Study Conclusions  - Left ventricle: The cavity size was normal. Wall thickness was   increased in a pattern of mild LVH. Systolic function was normal.   The estimated ejection fraction was in the range of 60% to 65%.   Wall motion was normal; there were no regional wall motion   abnormalities. Features are consistent with a pseudonormal left   ventricular filling pattern, with concomitant abnormal relaxation   and increased filling pressure (grade 2 diastolic dysfunction). - Aortic valve: There was moderate stenosis. There was mild to   moderate regurgitation directed centrally in the LVOT. Mean   gradient (S): 28 mm Hg. Valve area (VTI): 1.22 cm^2. - Mitral valve: There was mild regurgitation.   12/24/16 Carotids with plaque no stenosis as murmur radiated to neck   ETT:  01/20/16 normal  Max HR 157 10.9 METS  9:30 on Bruce protocol   She had CT in hospital 02/28/16 to r/o CAD and pulmonary nodules noted Family history of lung cancer Seen by [facility] and f/u hi res CT November 27th 2017 stable largest nodule 4 mm  ROS: Denies fever, malais, weight loss, blurry vision, decreased visual acuity, cough, sputum, SOB, hemoptysis, pleuritic  pain, palpitaitons, heartburn, abdominal pain, melena, lower extremity edema, claudication, or rash.  All other systems reviewed and negative   General: BP 130/70   Pulse 90   Ht 5\' 6"  (1.676 m)   Wt 159 lb 2 oz (72.2 kg)   SpO2 94%   BMI 25.68 kg/m   Affect appropriate Healthy:  appears stated age 89: normal Neck supple with no adenopathy JVP normal no bruits no thyromegaly Lungs clear with no wheezing and good diaphragmatic motion Heart:  S1/S2 3/6 SEM of AS murmur,rub, gallop or click PMI normal Abdomen: benighn, BS positve, no tenderness, no AAA no bruit.  No HSM or HJR Distal pulses intact with no bruits No edema Neuro non-focal Skin warm and dry No muscular weakness  Medications Current Outpatient Prescriptions  Medication Sig Dispense Refill  . amLODipine (NORVASC) 5 MG tablet TAKE 1 TABLET BY MOUTH EVERY DAY 90 tablet 3  . fluticasone (FLONASE) 50 MCG/ACT nasal spray Place 2 sprays into both nostrils daily as needed. For allergies  1  . omeprazole (PRILOSEC) 40 MG capsule Take 40 mg by mouth daily as needed (heartburn).     No current facility-administered medications for this visit.     Allergies Cyprodenate; Diphenhydramine hcl; Codeine; and Morphine and related  Family History: Family History  Problem Relation Age of Onset  . Cancer Mother     Lung  . Cancer Father     Lung  . Hypertension Father   .  Cancer Sister     Lung  . Heart attack Paternal Grandfather   . Heart attack Paternal Aunt     Social History: Social History   Social History  . Marital status: Married    Spouse name: N/A  . Number of children: N/A  . Years of education: N/A   Occupational History  . Not on file.   Social History Main Topics  . Smoking status: Former Smoker    Packs/day: 1.00    Years: 10.00  . Smokeless tobacco: Never Used     Comment: quit around 2007   . Alcohol use Yes     Comment: occ  . Drug use: No  . Sexual activity: Not on file   Other  Topics Concern  . Not on file   Social History Narrative  . No narrative on file    Electrocardiogram:  Low atrial focus regular rhyhthm rate 93 normal QRS complexes  08/26/15  SR rate 78 normal   Assessment and Plan  Palpitations :  Resolved monitor HTN:  Well controlled.  Continue current medications and low sodium Dash type diet.   AS:  Moderate  Active walks 10 miles/day f/u echo March 2019 CAD none and normal ETT 01/20/16 with good HR/BP response  Lung Nodule: with family history of lung cancer Non smoker f/u hi res CT 09/20/16 ok  And f/u [facility]   Aorta:  4.0 cm by CT 09/20/16  can follow with her screening CT;s and ascending root by echo  Bruit:  ? Transferred murmur from AS  Duplex personally reviewed 12/24/16 plaque no stenosis    F/U with me in  A year with echo    Jenkins Rouge

## 2017-01-04 ENCOUNTER — Encounter: Payer: Self-pay | Admitting: Cardiovascular Disease

## 2017-01-11 ENCOUNTER — Ambulatory Visit (INDEPENDENT_AMBULATORY_CARE_PROVIDER_SITE_OTHER): Payer: Federal, State, Local not specified - PPO | Admitting: Cardiovascular Disease

## 2017-01-11 VITALS — BP 130/70 | HR 90 | Ht 66.0 in | Wt 159.1 lb

## 2017-01-11 DIAGNOSIS — I350 Nonrheumatic aortic (valve) stenosis: Secondary | ICD-10-CM

## 2017-01-11 NOTE — Patient Instructions (Addendum)

## 2017-02-22 DIAGNOSIS — M67479 Ganglion, unspecified ankle and foot: Secondary | ICD-10-CM | POA: Diagnosis not present

## 2017-02-28 ENCOUNTER — Ambulatory Visit (INDEPENDENT_AMBULATORY_CARE_PROVIDER_SITE_OTHER): Payer: Federal, State, Local not specified - PPO | Admitting: Podiatry

## 2017-02-28 ENCOUNTER — Encounter: Payer: Self-pay | Admitting: Podiatry

## 2017-02-28 ENCOUNTER — Ambulatory Visit (INDEPENDENT_AMBULATORY_CARE_PROVIDER_SITE_OTHER): Payer: Federal, State, Local not specified - PPO

## 2017-02-28 DIAGNOSIS — M6740 Ganglion, unspecified site: Secondary | ICD-10-CM

## 2017-02-28 DIAGNOSIS — M722 Plantar fascial fibromatosis: Secondary | ICD-10-CM

## 2017-02-28 MED ORDER — TRIAMCINOLONE ACETONIDE 10 MG/ML IJ SUSP
10.0000 mg | Freq: Once | INTRAMUSCULAR | Status: AC
Start: 2017-02-28 — End: 2017-02-28
  Administered 2017-02-28: 10 mg

## 2017-02-28 NOTE — Progress Notes (Signed)
   Subjective:    Patient ID: KRISALYN YANKOWSKI, female    DOB: [birth year], 61 y.o.   MRN: [id number]  HPI  61 year old female presents the office they for concerns of a knot on the bottom of her left foot that she noticed that one week ago. She saw her primary care physician for this and she presents today for further evaluation. She says the area throbs and she puts pressure to the area and she occasionally gets sharp pain. She's had no recent treatment for this. She denies any recent injury or trauma. She has no other complaints today.  Review of Systems  All other systems reviewed and are negative.      Objective:   Physical Exam General: AAO x3, NAD  Dermatological: Skin is warm, dry and supple bilateral. Nails x 10 are well manicured; remaining integument appears unremarkable at this time. There are no open sores, no preulcerative lesions, no rash or signs of infection present.  Vascular: Dorsalis Pedis artery and Posterior Tibial artery pedal pulses are 2/4 bilateral with immedate capillary fill time.  There is no pain with calf compression, swelling, warmth, erythema.   Neruologic: Grossly intact via light touch bilateral. Vibratory intact via tuning fork bilateral. Protective threshold with Semmes Wienstein monofilament intact to all pedal sites bilateral.   Musculoskeletal: On the medial band plantar fascia just proximal to the sesamoid complex is a firm, well soft tissue mass present within the medial band of the plantar fascia. Mild topalpation to this area. There is no other area tenderness there is no area pinpoint bony tenderness. There is no abundant edema, erythema, increase in warmth. Mild decreased range of motion the first MTPJ. Muscular strength 5/5 in all groups tested bilateral.  Gait: Unassisted, Nonantalgic.      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 Skin markers utilized to identify an area of soft tissue mass. This ossification present there is no bony destruction. -Today's steroid injection with a makeshift Kenalog locally the second was infiltrated into the soft tissue mass without complications. Post injection care was discussed. -Prescribed compound cream with verapamil. -Stretching exercises daily. -RTC 3 weeks or sooner if needed.  Celesta Gentile, DPM

## 2017-02-28 NOTE — Patient Instructions (Signed)

## 2017-03-25 ENCOUNTER — Ambulatory Visit (INDEPENDENT_AMBULATORY_CARE_PROVIDER_SITE_OTHER): Payer: Federal, State, Local not specified - PPO | Admitting: Podiatry

## 2017-03-25 ENCOUNTER — Encounter: Payer: Self-pay | Admitting: Podiatry

## 2017-03-25 DIAGNOSIS — M722 Plantar fascial fibromatosis: Secondary | ICD-10-CM

## 2017-03-31 NOTE — Progress Notes (Signed)
Subjective: 61 year old female presents the office today for follow-up evaluation of the left plantar fibroma. She states that she has not noticed much improvement since last appointment. She's been continuing with a verapamil cream. She denies any, occasions last steroid injection. She has no new concerns today. Denies any systemic complaints such as fevers, chills, nausea, vomiting. No acute changes since last appointment, and no other complaints at this time.   Objective: AAO x3, NAD DP/PT pulses palpable bilaterally, CRT less than 3 seconds On the medial band plantar fasciitis proximal sesamoids is a prominent non-mobile firm soft tissue mass present in the medial band plantar fascia which appears to be unchanged in size. Minimal tenderness palpation of the area and there is no overlying edema, erythema, increase in warmth. No other lesions identified. No open lesions or pre-ulcerative lesions.  No pain with calf compression, swelling, warmth, erythema  Assessment: Plantar fibroma left foot  Plan: -All treatment options discussed with the patient including all alternatives, risks, complications.  -Patient elects to proceed with steroid injection into the soft tissue mass. Under sterile skin preparation, a total of 2.5cc of kenalog 10, 0.5% Marcaine plain, and 2% lidocaine plain were infiltrated into the symptomatic area without complication. A band-aid was applied. Patient tolerated the injection well without complication. Post-injection care with discussed with the patient. Discussed with the patient to ice the area over the next couple of days to help prevent a steroid flare.  -Continue verapamil cream. -Stretching daily. -Patient encouraged to call the office with any questions, concerns, change in symptoms.   Celesta Gentile, DPM

## 2017-04-15 ENCOUNTER — Ambulatory Visit (INDEPENDENT_AMBULATORY_CARE_PROVIDER_SITE_OTHER): Payer: Federal, State, Local not specified - PPO | Admitting: Podiatry

## 2017-04-15 ENCOUNTER — Encounter: Payer: Self-pay | Admitting: Podiatry

## 2017-04-15 DIAGNOSIS — M722 Plantar fascial fibromatosis: Secondary | ICD-10-CM | POA: Diagnosis not present

## 2017-04-17 NOTE — Progress Notes (Signed)
Subjective: 61 year old female presents the office today for follow-up evaluation of the left plantar fibroma. She states that she is doing much better and she is able to walk without any pain. She states the injections of and helping and the mass has almost completely resolved. She has continued with stretching and using verapamil cream.  Denies any systemic complaints such as fevers, chills, nausea, vomiting. No acute changes since last appointment, and no other complaints at this time.   Objective: AAO x3, NAD DP/PT pulses palpable bilaterally, CRT less than 3 seconds On the medial band plantar fasciitis proximal sesamoids is a prominent non-mobile firm soft tissue mass present in the medial band plantar fascia which has almost completely resolved. There is no tenderness palpation of the area and there is no overlying edema, erythema, increase in warmth. No other lesions identified. No open lesions or pre-ulcerative lesions.  No pain with calf compression, swelling, warmth, erythema  Assessment: Plantar fibroma left foot, almost resolved  Plan: -All treatment options discussed with the patient including all alternatives, risks, complications.  -Patient elects to proceed with steroid injection into the soft tissue mass. This is her 3rd injection. Under sterile skin preparation, a total of 2.5cc of kenalog 10, 0.5% Marcaine plain, and 2% lidocaine plain were infiltrated into the symptomatic area without complication. A band-aid was applied. Patient tolerated the injection well without complication. Post-injection care with discussed with the patient. Discussed with the patient to ice the area over the next couple of days to help prevent a steroid flare.  -Continue verapamil cream. -Stretching daily. -At this point, follow-up if symptoms do not completely resolve in the next few weeks or if there is any reoccurrence. -Patient encouraged to call the office with any questions, concerns, change in  symptoms.   Celesta Gentile, DPM

## 2017-05-06 ENCOUNTER — Encounter: Payer: Self-pay | Admitting: Podiatry

## 2017-05-06 ENCOUNTER — Ambulatory Visit (INDEPENDENT_AMBULATORY_CARE_PROVIDER_SITE_OTHER): Payer: Federal, State, Local not specified - PPO | Admitting: Podiatry

## 2017-05-06 DIAGNOSIS — M722 Plantar fascial fibromatosis: Secondary | ICD-10-CM

## 2017-05-08 NOTE — Progress Notes (Signed)
Subjective: 61 year old female presents the office today for follow-up evaluation of the left plantar fibroma. She states that she is doing well and the pain is much improved although she still gets some minimal discomfort at times. She states of the size is also much improved. She's continue with the verapamil cream as well. She denies any recent injury or trauma denies any swelling or redness. She has no new concerns today. Denies any systemic complaints such as fevers, chills, nausea, vomiting. No acute changes since last appointment, and no other complaints at this time.   Objective: AAO x3, NAD DP/PT pulses palpable bilaterally, CRT less than 3 seconds On the medial band plantar fasciitis proximal sesamoids is a prominent non-mobile firm soft tissue mass present in the medial band plantar fascia which has almost completely resolved. Bearing small amount of mass remains. There is mild tenderness palpation of the area today. There is no overlying edema, erythema, increase in warmth. There is no skin change along the lesions.  No open lesions or pre-ulcerative lesions.  No pain with calf compression, swelling, warmth, erythema  Assessment: Plantar fibroma left foot, almost resolved  Plan: -All treatment options discussed with the patient including all alternatives, risks, complications.  -Patient elects to proceed with steroid injection into the soft tissue mass. This is her 4th injection. Under sterile skin preparation, a total of 2.5cc of kenalog 10, 0.5% Marcaine plain, and 2% lidocaine plain were infiltrated into the symptomatic area without complication. A band-aid was applied. Patient tolerated the injection well without complication. Post-injection care with discussed with the patient. Discussed with the patient to ice the area over the next couple of days to help prevent a steroid flare.  -Continue verapamil cream. -Will hold off on any further injection at this point  -At this point,  follow-up if symptoms do not completely resolve in the 4-6 weeks or if there is any reoccurrence. -Patient encouraged to call the office with any questions, concerns, change in symptoms.   Celesta Gentile, DPM

## 2017-08-25 ENCOUNTER — Ambulatory Visit (INDEPENDENT_AMBULATORY_CARE_PROVIDER_SITE_OTHER)
Admission: RE | Admit: 2017-08-25 | Discharge: 2017-08-25 | Disposition: A | Discharge status: Home / Self Care | Payer: Federal, State, Local not specified - PPO | Source: Ambulatory Visit | Attending: Emergency Medicine | Admitting: Emergency Medicine

## 2017-08-25 ENCOUNTER — Encounter (INDEPENDENT_AMBULATORY_CARE_PROVIDER_SITE_OTHER): Payer: Self-pay

## 2017-08-25 DIAGNOSIS — R918 Other nonspecific abnormal finding of lung field: Secondary | ICD-10-CM | POA: Diagnosis not present

## 2017-09-05 DIAGNOSIS — M25512 Pain in left shoulder: Secondary | ICD-10-CM | POA: Diagnosis not present

## 2017-09-05 DIAGNOSIS — Z23 Encounter for immunization: Secondary | ICD-10-CM | POA: Diagnosis not present

## 2017-09-23 ENCOUNTER — Telehealth: Payer: Self-pay | Admitting: Emergency Medicine

## 2017-09-23 NOTE — Telephone Encounter (Signed)
Called and spoke to pt. Pt is requesting results of CT chest. Pt last seen in 03/2016 and was advised in 09/2016 (phone note) to return in one year after CT chest. Pt had CT chest on 08/25/2017 and pt has agreed to come in for ROV to review results. Pt's appt is on 12.10.18 with RB to review results. Pt verbalized understanding and denied any further questions or concerns at this time.

## 2017-09-29 ENCOUNTER — Other Ambulatory Visit: Payer: Self-pay | Admitting: Cardiovascular Disease

## 2017-10-03 ENCOUNTER — Ambulatory Visit: Payer: Federal, State, Local not specified - PPO | Admitting: Emergency Medicine

## 2017-10-19 DIAGNOSIS — M545 Low back pain: Secondary | ICD-10-CM | POA: Diagnosis not present

## 2017-10-21 DIAGNOSIS — M545 Low back pain: Secondary | ICD-10-CM | POA: Diagnosis not present

## 2017-11-03 DIAGNOSIS — M5441 Lumbago with sciatica, right side: Secondary | ICD-10-CM | POA: Diagnosis not present

## 2017-11-03 DIAGNOSIS — M5442 Lumbago with sciatica, left side: Secondary | ICD-10-CM | POA: Diagnosis not present

## 2017-11-03 DIAGNOSIS — M545 Low back pain: Secondary | ICD-10-CM | POA: Diagnosis not present

## 2017-11-04 ENCOUNTER — Ambulatory Visit (INDEPENDENT_AMBULATORY_CARE_PROVIDER_SITE_OTHER): Payer: Federal, State, Local not specified - PPO | Admitting: Emergency Medicine

## 2017-11-04 ENCOUNTER — Encounter: Payer: Self-pay | Admitting: Emergency Medicine

## 2017-11-04 DIAGNOSIS — R911 Solitary pulmonary nodule: Secondary | ICD-10-CM | POA: Diagnosis not present

## 2017-11-04 DIAGNOSIS — R918 Other nonspecific abnormal finding of lung field: Secondary | ICD-10-CM | POA: Diagnosis not present

## 2017-11-04 NOTE — Patient Instructions (Signed)
Small pulmonary nodules are stable on serial CT scans over the last year and a half.  We need to repeat your scan in May 2020 to confirm long-term stability.  If there is no change over that time interval then we do not need to continue doing CT scans. Follow with Dr Lamonte Sakai next year after the CT scan is done to review the results.  Please call us sooner if you have any problems

## 2017-11-04 NOTE — Progress Notes (Signed)
   Subjective:    Patient ID: [name], female    DOB: 1956-08-12, 62 y.o.   MRN: [id number]  HPI 62 year old woman, former smoker, history of hypertension, GERD, chronic renal insufficiency in the setting of a prior renal cell carcinoma and a right nephrectomy. She is followed by Dr Johnsie Cancel for HTN, palpitations. She had episode of CP and underwent CT-PA 02/28/16 that shows several very small pulm nodules as described below and prominent ascending thoracic aorta . Then she had a cardiac CT scan that was reassuring. She is referred today to follow her pulmonary nodules.   Father with hx lung CA, treated surgically. Both her sister and mother had lung CA as well.  No hx RA, SLE or autoimmune disease.   ROV 11/04/17 --this is a follow-up visit for patient with a history of tobacco use, hypertension, GERD, chronic renal insufficiency.  She had a right nephrectomy for prior renal cell carcinoma.  I saw her originally in June 2017 to follow pulmonary nodules.  She underwent a repeat CT scan of her chest on 08/25/17 that I have reviewed.  This shows stable bilateral poorly formed nodules stable back to 02/28/16.  No new nodules were seen. She has been doing well, good exercise tolerance. Follows w Dr Johnsie Cancel reliably.    Review of Systems  Constitutional: Negative for fever and unexpected weight change.  HENT: Negative for congestion, dental problem, ear pain, nosebleeds, postnasal drip, rhinorrhea, sinus pressure, sneezing, sore throat and trouble swallowing.   Eyes: Negative for redness and itching.  Respiratory: Negative for cough, chest tightness, shortness of breath and wheezing.   Cardiovascular: Negative for palpitations and leg swelling.  Gastrointestinal: Negative for nausea and vomiting.  Genitourinary: Negative for dysuria.  Musculoskeletal: Negative for joint swelling.  Skin: Negative for rash.  Neurological: Negative for headaches.  Hematological: Does not bruise/bleed easily.    Psychiatric/Behavioral: Negative for dysphoric mood. The patient is not nervous/anxious.          Objective:   Physical Exam Vitals:   11/04/17 1128  BP: 130/90  Pulse: 98  SpO2: 99%  Weight: 156 lb (70.8 kg)  Height: 5\' 6"  (1.676 m)   Gen: Pleasant, well-nourished, in no distress,  normal affect  ENT: No lesions,  mouth clear,  oropharynx clear, no postnasal drip  Neck: No JVD, no stridor  Lungs: No use of accessory muscles, clear without rales or rhonchi  Cardiovascular: RRR, heart sounds normal, no murmur or gallops, no peripheral edema  Musculoskeletal: No deformities, no cyanosis or clubbing  Neuro: alert, non focal  Skin: Warm, no lesions or rashes     Assessment & Plan:  Pulmonary nodules/lesions, multiple In a patient with history of tobacco, renal cell carcinoma, positive family history.  We have 1.5 years of stability at this point.  I believe she needs a repeat scan at 18 months.  If she remains stable for the full 3 years then we can stop doing serial CT scans at that time.  I will review the results with her when they are available.  Baltazar Apo, MD, PhD 11/04/2017, 11:49 AM [facility] [phone] or if no answer [phone]

## 2017-11-04 NOTE — Assessment & Plan Note (Signed)
In a patient with history of tobacco, renal cell carcinoma, positive family history.  We have 1.5 years of stability at this point.  I believe she needs a repeat scan at 18 months.  If she remains stable for the full 3 years then we can stop doing serial CT scans at that time.  I will review the results with her when they are available.

## 2018-01-04 ENCOUNTER — Other Ambulatory Visit: Payer: Self-pay | Admitting: Cardiovascular Disease

## 2018-01-04 MED ORDER — AMLODIPINE BESYLATE 5 MG PO TABS: 5.0000 mg | ORAL_TABLET | Freq: Every day | ORAL | 0 refills | Status: DC

## 2018-01-11 DIAGNOSIS — B0229 Other postherpetic nervous system involvement: Secondary | ICD-10-CM | POA: Diagnosis not present

## 2018-01-11 DIAGNOSIS — B009 Herpesviral infection, unspecified: Secondary | ICD-10-CM | POA: Diagnosis not present

## 2018-01-11 DIAGNOSIS — R3915 Urgency of urination: Secondary | ICD-10-CM | POA: Diagnosis not present

## 2018-01-13 ENCOUNTER — Telehealth: Payer: Self-pay

## 2018-01-13 ENCOUNTER — Other Ambulatory Visit: Payer: Self-pay

## 2018-01-13 ENCOUNTER — Ambulatory Visit (HOSPITAL_COMMUNITY): Payer: Federal, State, Local not specified - PPO | Attending: Cardiovascular Disease

## 2018-01-13 ENCOUNTER — Telehealth: Payer: Self-pay | Admitting: Cardiovascular Disease

## 2018-01-13 DIAGNOSIS — R002 Palpitations: Secondary | ICD-10-CM | POA: Insufficient documentation

## 2018-01-13 DIAGNOSIS — I350 Nonrheumatic aortic (valve) stenosis: Secondary | ICD-10-CM | POA: Diagnosis not present

## 2018-01-13 DIAGNOSIS — Z87891 Personal history of nicotine dependence: Secondary | ICD-10-CM | POA: Insufficient documentation

## 2018-01-13 DIAGNOSIS — I119 Hypertensive heart disease without heart failure: Secondary | ICD-10-CM | POA: Diagnosis not present

## 2018-01-13 DIAGNOSIS — I352 Nonrheumatic aortic (valve) stenosis with insufficiency: Secondary | ICD-10-CM | POA: Insufficient documentation

## 2018-01-13 NOTE — Telephone Encounter (Signed)
amlodipine refill request from walk in form. Amlodipine already sent in for 90 day supply on 01/04/2018 for 90 day supply.  Outpatient Medication Detail    Disp Refills Start End   amLODipine (NORVASC) 5 MG tablet 90 tablet 0 01/04/2018    Sig - Route: Take 1 tablet (5 mg total) by mouth daily. Please keep upcoming appt for future refills. Thank you - Oral   Sent to pharmacy as: amLODipine (NORVASC) 5 MG tablet   E-Prescribing Status: Receipt confirmed by pharmacy (01/04/2018 12:11 PM EDT)   Pharmacy   [id number] - [place], [place]

## 2018-01-13 NOTE — Telephone Encounter (Signed)
Walk in pt Form-Medication Refill. Placed in refill box.

## 2018-01-24 NOTE — Progress Notes (Signed)
Patient ID: [name], female   DOB: Jul 26, 1956, 62 y.o.   MRN: [id number]  62 y.o. f/u for aortic valve disease.  History of palpitations intolerant to beta blocker controlled with calcium blocker. HTN well controlled Anxiety and history of renal cell CA followed at [facility]. Cares for two grand children. Normal ETT 01/20/16. Cardiac CT 02/28/16 with calcium score only 17 no obstructive disease aortic root 4.0 cm  Carotids plaque no stenosis 12/24/16 has referred AS murmur. Echo 12/24/16 had mean gradient 28 and peak 55 mmHg. Reviewed most recent echo 01/13/17 has been progression but still not surgical pulmonary nodules largest 4 mm followed by Dr Lamonte Sakai Non smoker   Study Conclusions  - Left ventricle: The cavity size was normal. There was mild   concentric hypertrophy. Systolic function was normal. The   estimated ejection fraction was in the range of 60% to 65%. Wall   motion was normal; there were no regional wall motion   abnormalities. Doppler parameters are consistent with abnormal   left ventricular relaxation (grade 1 diastolic dysfunction).   Doppler parameters are consistent with elevated mean left atrial   filling pressure. - Aortic valve: Valve mobility was moderately restricted.   Transvalvular velocity was increased more than expected, due to   high stroke volume. There was moderate to severe stenosis. There   was moderate regurgitation directed centrally in the LVOT. - Left atrium: The atrium was mildly dilated.  Impressions:  - Compared to 2018, the aortic valve gradients are higher. This is   due to worsening aortic insufficiency and higher stroke volume,   as well as progression of stenosis. Dimensionless index has   worsened slightly, but remains in non-severe range. The ejection   jet is early peaking.  She is very active walking 11 miles/day No pre syncope, dyspnea or chest pain Discussed progression of her echo and likely need for surgery within 2 years   ROS: Denies  fever, malais, weight loss, blurry vision, decreased visual acuity, cough, sputum, SOB, hemoptysis, pleuritic pain, palpitaitons, heartburn, abdominal pain, melena, lower extremity edema, claudication, or rash.  All other systems reviewed and negative   General: BP 130/74   Pulse 94   Ht 5\' 6"  (1.676 m)   Wt 153 lb 3.2 oz (69.5 kg)   SpO2 97%   BMI 24.73 kg/m  Affect appropriate Healthy:  appears stated age 72: normal Neck supple with no adenopathy JVP normal no bruits no thyromegaly Lungs clear with no wheezing and good diaphragmatic motion Heart:  S1/S2 honking AS murmur  and AR  murmur, no rub, gallop or click PMI normal Abdomen: benighn, BS positve, no tenderness, no AAA no bruit.  No HSM or HJR Distal pulses intact with no bruits No edema Neuro non-focal Skin warm and dry No muscular weakness   Medications Current Outpatient Medications  Medication Sig Dispense Refill  . amLODipine (NORVASC) 5 MG tablet Take 1 tablet (5 mg total) by mouth daily. 90 tablet 3  . fluticasone (FLONASE) 50 MCG/ACT nasal spray Place 2 sprays into both nostrils daily as needed. For allergies  1  . omeprazole (PRILOSEC) 40 MG capsule Take 40 mg by mouth daily as needed (heartburn).     No current facility-administered medications for this visit.     Allergies Cyprodenate; Diphenhydramine hcl; Codeine; and Morphine and related  Family History: Family History  Problem Relation Age of Onset  . Cancer Mother        Lung  . Cancer Father  Lung  . Hypertension Father   . Cancer Sister        Lung  . Heart attack Paternal Grandfather   . Heart attack Paternal Aunt     Social History: Social History   Socioeconomic History  . Marital status: Married    Spouse name: Not on file  . Number of children: Not on file  . Years of education: Not on file  . Highest education level: Not on file  Occupational History  . Not on file  Social Needs  . Financial resource strain: Not  on file  . Food insecurity:    Worry: Not on file    Inability: Not on file  . Transportation needs:    Medical: Not on file    Non-medical: Not on file  Tobacco Use  . Smoking status: Former Smoker    Packs/day: 1.00    Years: 10.00    Pack years: 10.00    Types: Cigarettes    Last attempt to quit: 10/25/2005    Years since quitting: 12.2  . Smokeless tobacco: Never Used  . Tobacco comment: quit around 2007   Substance and Sexual Activity  . Alcohol use: Yes    Comment: occ  . Drug use: No  . Sexual activity: Not on file  Lifestyle  . Physical activity:    Days per week: Not on file    Minutes per session: Not on file  . Stress: Not on file  Relationships  . Social connections:    Talks on phone: Not on file    Gets together: Not on file    Attends religious service: Not on file    Active member of club or organization: Not on file    Attends meetings of clubs or organizations: Not on file    Relationship status: Not on file  . Intimate partner violence:    Fear of current or ex partner: Not on file    Emotionally abused: Not on file    Physically abused: Not on file    Forced sexual activity: Not on file  Other Topics Concern  . Not on file  Social History Narrative  . Not on file    Electrocardiogram:  Low atrial focus regular rhyhthm rate 93 normal QRS complexes  08/26/15  SR rate 78 normal 01/27/18  NSR rate 83 LAE otherwise normal   Assessment and Plan  Palpitations :  Resolved observe  HTN:  Well controlled.  Continue current medications and low sodium Dash type diet.   AS:   Reviewed echo from 01/13/18  Moderate to severe AS mean gradient 38 mmHg peak 78 mmHg Moderate AR with DVI .26 f/u echo 6 months Long discussion about need for AVR within next 2 years She is asymptomatic and very active currently  CAD none and normal ETT 01/20/16 with good HR/BP response Cardiac CT May 2017 no obstructive disease  Lung Nodule: with family history of lung cancer Non smoker  f/u hi res CT 09/20/16 ok  And f/u [facility]   Aorta:  4.0 cm by CT 09/20/16  can follow with her screening CT;s and ascending root by echo  Bruit:  ? Transferred murmur from AS  Duplex personally reviewed 12/24/16 plaque no stenosis    F/U with me in  6 months with echo    Jenkins Rouge

## 2018-01-27 ENCOUNTER — Ambulatory Visit: Payer: Federal, State, Local not specified - PPO | Admitting: Cardiovascular Disease

## 2018-01-27 ENCOUNTER — Encounter: Payer: Self-pay | Admitting: Cardiovascular Disease

## 2018-01-27 VITALS — BP 130/74 | HR 94 | Ht 66.0 in | Wt 153.2 lb

## 2018-01-27 DIAGNOSIS — I350 Nonrheumatic aortic (valve) stenosis: Secondary | ICD-10-CM | POA: Diagnosis not present

## 2018-01-27 DIAGNOSIS — R0989 Other specified symptoms and signs involving the circulatory and respiratory systems: Secondary | ICD-10-CM | POA: Diagnosis not present

## 2018-01-27 DIAGNOSIS — I2510 Atherosclerotic heart disease of native coronary artery without angina pectoris: Secondary | ICD-10-CM

## 2018-01-27 DIAGNOSIS — I10 Essential (primary) hypertension: Secondary | ICD-10-CM

## 2018-01-27 MED ORDER — AMLODIPINE BESYLATE 5 MG PO TABS: 5.0000 mg | ORAL_TABLET | Freq: Every day | ORAL | 3 refills | Status: DC

## 2018-01-27 NOTE — Patient Instructions (Addendum)
Medication Instructions:  Your physician recommends that you continue on your current medications as directed. Please refer to the Current Medication list given to you today.  Labwork: NONE  Testing/Procedures: Your physician has requested that you have an echocardiogram in 6 months, same day as office visit. Echocardiography is a painless test that uses sound waves to create images of your heart. It provides your doctor with information about the size and shape of your heart and how well your heart's chambers and valves are working. This procedure takes approximately one hour. There are no restrictions for this procedure.  Follow-Up: Your physician wants you to follow-up in: 6 months with Dr. Johnsie Cancel. You will receive a reminder letter in the mail two months in advance. If you don't receive a letter, please call our office to schedule the follow-up appointment.   If you need a refill on your cardiac medications before your next appointment, please call your pharmacy.

## 2018-05-09 DIAGNOSIS — I10 Essential (primary) hypertension: Secondary | ICD-10-CM | POA: Diagnosis not present

## 2018-05-09 DIAGNOSIS — K219 Gastro-esophageal reflux disease without esophagitis: Secondary | ICD-10-CM | POA: Diagnosis not present

## 2018-05-09 DIAGNOSIS — Z13220 Encounter for screening for lipoid disorders: Secondary | ICD-10-CM | POA: Diagnosis not present

## 2018-05-09 DIAGNOSIS — Z131 Encounter for screening for diabetes mellitus: Secondary | ICD-10-CM | POA: Diagnosis not present

## 2018-05-09 DIAGNOSIS — R918 Other nonspecific abnormal finding of lung field: Secondary | ICD-10-CM | POA: Diagnosis not present

## 2018-05-09 DIAGNOSIS — I350 Nonrheumatic aortic (valve) stenosis: Secondary | ICD-10-CM | POA: Diagnosis not present

## 2018-07-06 ENCOUNTER — Encounter: Payer: Self-pay | Admitting: Cardiovascular Disease

## 2018-07-07 DIAGNOSIS — R110 Nausea: Secondary | ICD-10-CM | POA: Diagnosis not present

## 2018-07-07 DIAGNOSIS — R197 Diarrhea, unspecified: Secondary | ICD-10-CM | POA: Diagnosis not present

## 2018-07-11 NOTE — Progress Notes (Signed)
Patient ID: [name], female   DOB: September 07, 1956, 62 y.o.   MRN: [id number]  62 y.o. f/u for aortic valve disease.  History of palpitations intolerant to beta blocker controlled with calcium blocker. HTN well controlled Anxiety and history of renal cell CA followed at [facility]. Cares for two grand children. Normal ETT 01/20/16. Cardiac CT 02/28/16 with calcium score only 17 no obstructive disease aortic root 4.0 cm  Carotids plaque no stenosis 12/24/16 has referred AS murmur. TTE done 01/13/18 EF 60-65% mean gradient 38 mmHg peak 78 mmHg and moderate AR.    She is very active walking 11 miles/day No pre syncope, dyspnea or chest pain She works as a Development worker, community carrier   TTE reviewed from today 07/19/18  Mean gradient 37 mmHg peak 69 mmHg peak velocity 4.1 m/sec and DVI .26 and moderate AR   Discussed need for f/u stress testing to r/o CAD given extent of her valve disease. Also discussed fact she will likely need AVR in next 2 years   ROS: Denies fever, malais, weight loss, blurry vision, decreased visual acuity, cough, sputum, SOB, hemoptysis, pleuritic pain, palpitaitons, heartburn, abdominal pain, melena, lower extremity edema, claudication, or rash.  All other systems reviewed and negative   General: BP 138/68   Pulse 81   Ht 5\' 6"  (1.676 m)   Wt 149 lb 9.6 oz (67.9 kg)   SpO2 97%   BMI 24.15 kg/m  Affect appropriate Healthy:  appears stated age 20: normal Neck supple with no adenopathy JVP normal no bruits no thyromegaly Lungs clear with no wheezing and good diaphragmatic motion Heart:  S1/S2 diminished "Honking" AS murmur,Diastolic AR murmur  no rub, gallop or click PMI normal Abdomen: benighn, BS positve, no tenderness, no AAA no bruit.  No HSM or HJR Distal pulses intact with no bruits No edema Neuro non-focal Skin warm and dry No muscular weakness    Medications Current Outpatient Medications  Medication Sig Dispense Refill  . amLODipine (NORVASC) 5 MG tablet Take 1 tablet (5  mg total) by mouth daily. 90 tablet 3  . fluticasone (FLONASE) 50 MCG/ACT nasal spray Place 2 sprays into both nostrils daily as needed. For allergies  1  . omeprazole (PRILOSEC) 40 MG capsule Take 40 mg by mouth daily as needed (heartburn).     No current facility-administered medications for this visit.     Allergies Cyprodenate; Diphenhydramine hcl; Codeine; and Morphine and related  Family History: Family History  Problem Relation Age of Onset  . Cancer Mother        Lung  . Cancer Father        Lung  . Hypertension Father   . Cancer Sister        Lung  . Heart attack Paternal Grandfather   . Heart attack Paternal Aunt     Social History: Social History   Socioeconomic History  . Marital status: Married    Spouse name: Not on file  . Number of children: Not on file  . Years of education: Not on file  . Highest education level: Not on file  Occupational History  . Not on file  Social Needs  . Financial resource strain: Not on file  . Food insecurity:    Worry: Not on file    Inability: Not on file  . Transportation needs:    Medical: Not on file    Non-medical: Not on file  Tobacco Use  . Smoking status: Former Smoker    Packs/day: 1.00  Years: 10.00    Pack years: 10.00    Types: Cigarettes    Last attempt to quit: 10/25/2005    Years since quitting: 12.7  . Smokeless tobacco: Never Used  . Tobacco comment: quit around 2007   Substance and Sexual Activity  . Alcohol use: Yes    Comment: occ  . Drug use: No  . Sexual activity: Not on file  Lifestyle  . Physical activity:    Days per week: Not on file    Minutes per session: Not on file  . Stress: Not on file  Relationships  . Social connections:    Talks on phone: Not on file    Gets together: Not on file    Attends religious service: Not on file    Active member of club or organization: Not on file    Attends meetings of clubs or organizations: Not on file    Relationship status: Not on file   . Intimate partner violence:    Fear of current or ex partner: Not on file    Emotionally abused: Not on file    Physically abused: Not on file    Forced sexual activity: Not on file  Other Topics Concern  . Not on file  Social History Narrative  . Not on file    Electrocardiogram:  Low atrial focus regular rhyhthm rate 93 normal QRS complexes  08/26/15  SR rate 78 normal 01/27/18  NSR rate 83 LAE otherwise normal   Assessment and Plan  Palpitations :  Resolved observe  HTN:  Well controlled.  Continue current medications and low sodium Dash type diet.   AS:   Reviewed echo from 01/13/18  Moderate to severe AS mean gradient 38 mmHg peak 78 mmHg Moderate AR with DVI .26 f/u echo today no real change and asymptomatic f/u echo in 6 months will only Need cath now if Lexiscan myovue is abnormal  CAD none and normal ETT 01/20/16 with good HR/BP response Cardiac CT May 2017 no obstructive disease  Lung Nodule: with family history of lung cancer Non smoker f/u hi res CT 09/20/16 ok  And f/u [facility] will order myovue given extent of her AS   Aorta:  4.0 cm by CT 09/20/16  can follow with her screening CT;s and ascending root by echo  Bruit:  ? Transferred murmur from AS  Duplex personally reviewed 12/24/16 plaque no stenosis    F/U with me in  6 months with echo    Jenkins Rouge

## 2018-07-19 ENCOUNTER — Ambulatory Visit: Payer: Federal, State, Local not specified - PPO | Admitting: Cardiovascular Disease

## 2018-07-19 ENCOUNTER — Other Ambulatory Visit: Payer: Self-pay

## 2018-07-19 ENCOUNTER — Encounter: Payer: Self-pay | Admitting: Cardiovascular Disease

## 2018-07-19 ENCOUNTER — Ambulatory Visit (HOSPITAL_COMMUNITY): Payer: Federal, State, Local not specified - PPO | Attending: Cardiovascular Disease

## 2018-07-19 VITALS — BP 138/68 | HR 81 | Ht 66.0 in | Wt 149.6 lb

## 2018-07-19 DIAGNOSIS — Z87891 Personal history of nicotine dependence: Secondary | ICD-10-CM | POA: Diagnosis not present

## 2018-07-19 DIAGNOSIS — I10 Essential (primary) hypertension: Secondary | ICD-10-CM

## 2018-07-19 DIAGNOSIS — I350 Nonrheumatic aortic (valve) stenosis: Secondary | ICD-10-CM

## 2018-07-19 DIAGNOSIS — R0989 Other specified symptoms and signs involving the circulatory and respiratory systems: Secondary | ICD-10-CM | POA: Insufficient documentation

## 2018-07-19 DIAGNOSIS — I119 Hypertensive heart disease without heart failure: Secondary | ICD-10-CM | POA: Diagnosis not present

## 2018-07-19 DIAGNOSIS — I083 Combined rheumatic disorders of mitral, aortic and tricuspid valves: Secondary | ICD-10-CM | POA: Diagnosis not present

## 2018-07-19 DIAGNOSIS — I2510 Atherosclerotic heart disease of native coronary artery without angina pectoris: Secondary | ICD-10-CM | POA: Insufficient documentation

## 2018-07-19 DIAGNOSIS — R0602 Shortness of breath: Secondary | ICD-10-CM

## 2018-07-19 DIAGNOSIS — R0789 Other chest pain: Secondary | ICD-10-CM

## 2018-07-19 NOTE — Patient Instructions (Addendum)
Medication Instructions:  Your physician recommends that you continue on your current medications as directed. Please refer to the Current Medication list given to you today.  Labwork: NONE  Testing/Procedures: Your physician has requested that you have a lexiscan myoview. For further information please visit [email or web address]. Please follow instruction sheet, as given.  Your physician has requested that you have an echocardiogram in 6 months. Echocardiography is a painless test that uses sound waves to create images of your heart. It provides your doctor with information about the size and shape of your heart and how well your heart's chambers and valves are working. This procedure takes approximately one hour. There are no restrictions for this procedure.  Follow-Up: Your physician wants you to follow-up in: 6 months with Dr. Nishan. You will receive a reminder letter in the mail two months in advance. If you don't receive a letter, please call our office to schedule the follow-up appointment.   If you need a refill on your cardiac medications before your next appointment, please call your pharmacy.    

## 2018-08-01 ENCOUNTER — Encounter (HOSPITAL_COMMUNITY): Payer: Federal, State, Local not specified - PPO

## 2018-08-02 ENCOUNTER — Telehealth (HOSPITAL_COMMUNITY): Payer: Self-pay | Admitting: *Deleted

## 2018-08-02 NOTE — Telephone Encounter (Signed)
Left message on voicemail per DPR in reference to upcoming appointment scheduled on 08/04/18 with detailed instructions given per Myocardial Perfusion Study Information Sheet for the test. LM to arrive 15 minutes early, and that it is imperative to arrive on time for appointment to keep from having the test rescheduled. If you need to cancel or reschedule your appointment, please call the office within 24 hours of your appointment. Failure to do so may result in a cancellation of your appointment, and a $50 no show fee. Phone number given for call back for any questions. Kirstie Peri

## 2018-08-04 ENCOUNTER — Ambulatory Visit (HOSPITAL_COMMUNITY): Payer: Federal, State, Local not specified - PPO | Attending: Internal Medicine

## 2018-08-04 DIAGNOSIS — R0789 Other chest pain: Secondary | ICD-10-CM

## 2018-08-04 DIAGNOSIS — R0602 Shortness of breath: Secondary | ICD-10-CM | POA: Insufficient documentation

## 2018-08-04 LAB — MYOCARDIAL PERFUSION IMAGING
CHL CUP NUCLEAR SDS: 4
CHL CUP RESTING HR STRESS: 90 {beats}/min
CSEPPHR: 113 {beats}/min
LV sys vol: 39 mL
LVDIAVOL: 108 mL (ref 46–106)
NUC STRESS TID: 1.01
SRS: 0
SSS: 4

## 2018-08-04 MED ORDER — TECHNETIUM TC 99M TETROFOSMIN IV KIT
10.2000 | PACK | Freq: Once | INTRAVENOUS | Status: AC | PRN
  Administered 2018-08-04: 10.2 via INTRAVENOUS
  Filled 2018-08-04: qty 11

## 2018-08-04 MED ORDER — TECHNETIUM TC 99M TETROFOSMIN IV KIT
31.8000 | PACK | Freq: Once | INTRAVENOUS | Status: AC | PRN
  Administered 2018-08-04: 31.8 via INTRAVENOUS
  Filled 2018-08-04: qty 32

## 2018-08-04 MED ORDER — REGADENOSON 0.4 MG/5ML IV SOLN
0.4000 mg | Freq: Once | INTRAVENOUS | Status: AC
  Administered 2018-08-04: 0.4 mg via INTRAVENOUS

## 2018-10-19 DIAGNOSIS — R509 Fever, unspecified: Secondary | ICD-10-CM | POA: Diagnosis not present

## 2018-10-19 DIAGNOSIS — J069 Acute upper respiratory infection, unspecified: Secondary | ICD-10-CM | POA: Diagnosis not present

## 2018-11-23 DIAGNOSIS — M79642 Pain in left hand: Secondary | ICD-10-CM | POA: Diagnosis not present

## 2018-11-23 DIAGNOSIS — M545 Low back pain: Secondary | ICD-10-CM | POA: Diagnosis not present

## 2018-11-30 DIAGNOSIS — M1811 Unilateral primary osteoarthritis of first carpometacarpal joint, right hand: Secondary | ICD-10-CM | POA: Diagnosis not present

## 2018-11-30 DIAGNOSIS — M65332 Trigger finger, left middle finger: Secondary | ICD-10-CM | POA: Diagnosis not present

## 2018-11-30 DIAGNOSIS — M25561 Pain in right knee: Secondary | ICD-10-CM | POA: Diagnosis not present

## 2018-12-27 ENCOUNTER — Ambulatory Visit (HOSPITAL_COMMUNITY): Payer: Federal, State, Local not specified - PPO | Attending: Cardiology

## 2018-12-27 DIAGNOSIS — I350 Nonrheumatic aortic (valve) stenosis: Secondary | ICD-10-CM | POA: Insufficient documentation

## 2018-12-27 DIAGNOSIS — I2510 Atherosclerotic heart disease of native coronary artery without angina pectoris: Secondary | ICD-10-CM | POA: Diagnosis not present

## 2018-12-28 DIAGNOSIS — M65332 Trigger finger, left middle finger: Secondary | ICD-10-CM | POA: Diagnosis not present

## 2018-12-28 DIAGNOSIS — M1811 Unilateral primary osteoarthritis of first carpometacarpal joint, right hand: Secondary | ICD-10-CM | POA: Diagnosis not present

## 2018-12-29 DIAGNOSIS — M79643 Pain in unspecified hand: Secondary | ICD-10-CM | POA: Diagnosis not present

## 2018-12-29 DIAGNOSIS — I10 Essential (primary) hypertension: Secondary | ICD-10-CM | POA: Diagnosis not present

## 2018-12-29 DIAGNOSIS — K219 Gastro-esophageal reflux disease without esophagitis: Secondary | ICD-10-CM | POA: Diagnosis not present

## 2019-01-02 ENCOUNTER — Telehealth: Payer: Self-pay | Admitting: Cardiovascular Disease

## 2019-01-02 DIAGNOSIS — I350 Nonrheumatic aortic (valve) stenosis: Secondary | ICD-10-CM

## 2019-01-02 NOTE — Telephone Encounter (Signed)
Notes recorded by Josue Hector, MD on 12/27/2018 at 1:30 PM EST Moderate to severe AS and moderate AR stable from before f/u in 6 months if not having any symptoms.   Patient aware of results. Will order echo for 6 months. Patient stated she was not having any symptoms and feels great.

## 2019-01-02 NOTE — Telephone Encounter (Signed)
Patient returned call for Echo results.  ?

## 2019-01-08 NOTE — Progress Notes (Deleted)
Patient ID: [name], female   DOB: 09-09-1956, 63 y.o.   MRN: [id number]     62 y.o. f/u for aortic valve disease.  History of palpitations intolerant to beta blocker controlled with calcium blocker. HTN well controlled Anxiety and history of renal cell CA followed at [facility]. Cares for two grand children. Normal ETT 01/20/16. Cardiac CT 02/28/16 with calcium score only 17 no obstructive disease aortic root 4.0 cm  Carotids plaque no stenosis 12/24/16 has referred AS murmur.  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  All other systems reviewed and negative   General: There were no vitals taken for this visit. Affect appropriate Healthy:  appears stated age 31: normal Neck supple with no adenopathy JVP normal no bruits no thyromegaly Lungs clear with no wheezing and good diaphragmatic motion Heart:  S1/S2 diminished "Honking" AS murmur,Diastolic AR murmur  no rub, gallop or click PMI normal Abdomen: benighn, BS positve, no tenderness, no AAA no bruit.  No HSM or HJR Distal pulses intact with no bruits No edema Neuro non-focal Skin warm and dry No muscular weakness    Medications Current Outpatient Medications  Medication Sig Dispense Refill  . amLODipine (NORVASC) 5 MG tablet Take 1 tablet (5 mg total) by mouth daily. 90 tablet 3  . fluticasone (FLONASE) 50 MCG/ACT nasal spray Place 2 sprays into both nostrils daily as needed. For allergies  1  . omeprazole (PRILOSEC) 40 MG capsule Take 40 mg by mouth daily as needed (heartburn).      No current facility-administered medications for this visit.     Allergies Cyprodenate; Diphenhydramine hcl; Codeine; and Morphine and related  Family History: Family History  Problem Relation Age of Onset  . Cancer Mother        Lung  . Cancer Father        Lung  . Hypertension Father   . Cancer Sister        Lung  . Heart attack Paternal Grandfather   . Heart attack Paternal Aunt     Social History: Social History   Socioeconomic History  . Marital status: Married    Spouse name: Not on file  . Number of children: Not on file  . Years of education: Not on file  . Highest education level: Not on file  Occupational History  . Not on file  Social Needs  . Financial resource strain: Not on file  . Food insecurity:    Worry: Not on file    Inability: Not on file  . Transportation needs:    Medical: Not on file    Non-medical: Not on file  Tobacco Use  . Smoking status: Former Smoker    Packs/day: 1.00    Years: 10.00    Pack years: 10.00    Types: Cigarettes    Last attempt to quit: 10/25/2005    Years since quitting: 13.2  . Smokeless tobacco: Never Used  . Tobacco comment: quit around 2007   Substance and Sexual Activity  . Alcohol use: Yes    Comment:  occ  . Drug use: No  . Sexual activity: Not on file  Lifestyle  . Physical activity:    Days per week: Not on file    Minutes per session: Not on file  . Stress: Not on file  Relationships  . Social connections:    Talks on phone: Not on file    Gets together: Not on file    Attends religious service: Not on file    Active member of club or organization: Not on file    Attends meetings of clubs or organizations: Not on file    Relationship status: Not on file  . Intimate partner violence:    Fear of current or ex partner: Not on file    Emotionally abused: Not on file    Physically abused: Not on file    Forced sexual activity: Not on file  Other Topics Concern  . Not on file  Social History  Narrative  . Not on file    Electrocardiogram:  Low atrial focus regular rhyhthm rate 93 normal QRS complexes  08/26/15  SR rate 78 normal 01/27/18  NSR rate 83 LAE otherwise normal   Assessment and Plan  Palpitations :  Resolved observe  HTN:  Well controlled.  Continue current medications and low sodium Dash type diet.   AS:   She is on cusp of needing valve replacement. She is active and asymptomatic *** No indications For surgery in regard to CAD or her aortic root   CAD none and normal ETT 01/20/16 and my ovue 08/04/18 with good HR/BP response Cardiac CT May 2017 no obstructive disease   Lung Nodule: with family history of lung cancer Non smoker f/u hi res CT 09/20/16 ok  And f/u [facility] will order myovue given extent of her AS   Aorta:  4.0 cm by CT 09/20/16  Not particularly dilated on recent echo    F/U with me in  6 months with echo    Jenkins Rouge

## 2019-01-12 ENCOUNTER — Ambulatory Visit: Payer: Federal, State, Local not specified - PPO | Admitting: Cardiovascular Disease

## 2019-01-29 ENCOUNTER — Telehealth: Payer: Self-pay

## 2019-01-29 NOTE — Telephone Encounter (Signed)
I left a message for the patient to return my call about appointment.  

## 2019-01-30 NOTE — Telephone Encounter (Signed)
Pt call in states that she will call on Thursday to schedule an earlier appt.

## 2019-02-06 ENCOUNTER — Telehealth: Payer: Self-pay

## 2019-02-06 NOTE — Telephone Encounter (Signed)
YOUR CARDIOLOGY TEAM HAS ARRANGED FOR AN E-VISIT FOR YOUR APPOINTMENT - PLEASE REVIEW IMPORTANT INFORMATION BELOW SEVERAL DAYS PRIOR TO YOUR APPOINTMENT  Due to the recent COVID-19 pandemic, we are transitioning in-person office visits to tele-medicine visits in an effort to decrease unnecessary exposure to our patients and staff. Medicare and most insurances are covering these visits without a copay needed. We also encourage you to sign up for MyChart if you have not already done so. You will need a smartphone if possible. For patients that do not have this, we can still complete the visit using a regular telephone but do prefer a smartphone to enable video when possible. You may have a close family member that lives with you that can help. If possible, we also ask that you have a blood pressure cuff and scale at home to measure your blood pressure, heart rate and weight prior to your scheduled appointment. Patients with clinical needs that need an in-person evaluation and testing will still be able to come to the office if absolutely necessary. If you have any questions, feel free to call our office.   2-3 DAYS BEFORE YOUR APPOINTMENT  You will receive a telephone call from one of our [facility] team members - your caller ID may say "Unknown caller." If this is a video visit, we will confirm that you have been able to download any necessary apps prior to the visit. We will remind you check your blood pressure, heart rate and weight prior to your scheduled appointment. If you have an Apple Watch or Kardia, please upload any pertinent ECG strips the day before or morning of your appointment to [facility]. Our staff will also make sure you have reviewed the consent and agree to move forward with your scheduled tele-health visit.     THE DAY OF YOUR APPOINTMENT  Approximately 15 minutes prior to your scheduled appointment, you will receive a telephone call from one of [facility] team - your caller ID may say  "Unknown caller."  Our staff will confirm medications, vital signs for the day and any symptoms you may be experiencing. Please have this information available prior to the time of visit start. It may also be helpful for you to have a pad of paper and pen handy for any instructions given during your visit. They will also walk you through joining the smartphone meeting if this is a video visit.    CONSENT FOR TELE-HEALTH VISIT - PLEASE REVIEW  I hereby voluntarily request, consent and authorize [facility] and its employed or contracted physicians, physician assistants, nurse practitioners or other licensed health care professionals (the Practitioner), to provide me with telemedicine health care services (the "Services") as deemed necessary by the treating Practitioner. I acknowledge and consent to receive the Services by the Practitioner via telemedicine. I understand that the telemedicine visit will involve communicating with the Practitioner through live audiovisual communication technology and the disclosure of certain medical information by electronic transmission. I acknowledge that I have been given the opportunity to request an in-person assessment or other available alternative prior to the telemedicine visit and am voluntarily participating in the telemedicine visit.  I understand that I have the right to withhold or withdraw my consent to the use of telemedicine in the course of my care at any time, without affecting my right to future care or treatment, and that the Practitioner or I may terminate the telemedicine visit at any time. I understand that I have the right to inspect all information  obtained and/or recorded in the course of the telemedicine visit and may receive copies of available information for a reasonable fee.  I understand that some of the potential risks of receiving the Services via telemedicine include:  Marland Kitchen Delay or interruption in medical evaluation due to technological  equipment failure or disruption; . Information transmitted may not be sufficient (e.g. poor resolution of images) to allow for appropriate medical decision making by the Practitioner; and/or  . In rare instances, security protocols could fail, causing a breach of personal health information.  Furthermore, I acknowledge that it is my responsibility to provide information about my medical history, conditions and care that is complete and accurate to the best of my ability. I acknowledge that Practitioner's advice, recommendations, and/or decision may be based on factors not within their control, such as incomplete or inaccurate data provided by me or distortions of diagnostic images or specimens that may result from electronic transmissions. I understand that the practice of medicine is not an exact science and that Practitioner makes no warranties or guarantees regarding treatment outcomes. I acknowledge that I will receive a copy of this consent concurrently upon execution via email to the email address I last provided but may also request a printed copy by calling the office of [facility].    I understand that my insurance will be billed for this visit.   I have read or had this consent read to me. . I understand the contents of this consent, which adequately explains the benefits and risks of the Services being provided via telemedicine.  . I have been provided ample opportunity to ask questions regarding this consent and the Services and have had my questions answered to my satisfaction. . I give my informed consent for the services to be provided through the use of telemedicine in my medical care  By participating in this telemedicine visit I agree to the above.

## 2019-02-07 NOTE — Progress Notes (Signed)
Virtual Visit via Video Note   This visit type was conducted due to national recommendations for restrictions regarding the COVID-19 Pandemic (e.g. social distancing) in an effort to limit this patient's exposure and mitigate transmission in our community.  Due to her co-morbid illnesses, this patient is at least at moderate risk for complications without adequate follow up.  This format is felt to be most appropriate for this patient at this time.  All issues noted in this document were discussed and addressed.  A limited physical exam was performed with this format.  Please refer to the patient's chart for her consent to telehealth for [facility].   Evaluation Performed:  Follow-up visit  Date:  02/07/2019   ID:  [name], Schley October 14, 1956, MRN [id number]  Patient Location: Home Provider Location: Office  PCP:  Leighton Ruff, MD  Cardiologist:  Jenkins Rouge, MD   Electrophysiologist:  None   Chief Complaint:  AV disease   History of Present Illness:    63 y.o. f/u for aortic valve disease.  History of palpitations intolerant to beta blocker controlled with calcium blocker. HTN well controlled Anxiety and history of renal cell CA followed at [facility]. Cares for two grand children. Normal ETT 01/20/16. Cardiac CT 02/28/16 with calcium score only 17 no obstructive disease aortic root 4.0 cm  Carotids plaque no stenosis 12/24/16 has referred AS murmur. TTE 12/27/18 reviewed moderate to severe AS moderate AR normal EF stable since 07/19/18 Normal myovue 08/04/18 Given good activity level and lack of symptoms have followed closely and not recommended cath or surgical intervention  She is very active walking 11 miles/day No pre syncope, dyspnea or chest pain She works as a Development worker, community carrier   The patient does not have symptoms concerning for COVID-19 infection (fever, chills, cough, or new shortness of breath).    Past Medical History:  Diagnosis Date  . Chronic kidney disease   .  Diverticulitis, colon   . GERD (gastroesophageal reflux disease)   . Hypertension   . Renal cancer [facility]) 2007   Surgically removed from right kidney   Past Surgical History:  Procedure Laterality Date  . APPENDECTOMY    . HERNIA REPAIR    . OVARIAN CYST SURGERY       No outpatient medications have been marked as taking for the 02/09/19 encounter (Appointment) with Josue Hector, MD.     Allergies:   Cyprodenate; Diphenhydramine hcl; Codeine; and Morphine and related   Social History   Tobacco Use  . Smoking status: Former Smoker    Packs/day: 1.00    Years: 10.00    Pack years: 10.00    Types: Cigarettes    Last attempt to quit: 10/25/2005    Years since quitting: 13.2  . Smokeless tobacco: Never Used  . Tobacco comment: quit around 2007   Substance Use Topics  . Alcohol use: Yes    Comment: occ  . Drug use: No     Family Hx: The patient's family history includes Cancer in her father, mother, and sister; Heart attack in her paternal aunt and paternal grandfather; Hypertension in her father.  ROS:   Please see the history of present illness.     All other systems reviewed and are negative.   Prior CV studies:   The following studies were reviewed today:  Echo 12/27/18 Myovue 08/04/18  Labs/Other Tests and Data Reviewed:    EKG:   SR rate 83 LAE 01/27/18  Recent Labs: No results found  for requested labs within last 8760 hours.   Recent Lipid Panel No results found for: CHOL, TRIG, HDL, CHOLHDL, LDLCALC, LDLDIRECT  Wt Readings from Last 3 Encounters:  08/04/18 67.6 kg  07/19/18 67.9 kg  01/27/18 69.5 kg     Objective:    Vital Signs:  There were no vitals taken for this visit.   Well nourished, well developed female in no acute distress. Skin warm and dry No JVP elevation No tachypnea No edema  ASSESSMENT & PLAN:     Palpitations :  Resolved observe   HTN:  Well controlled.  Continue current medications and low sodium Dash type diet.    AS:    Reviewed echo from 12/27/18  Moderate to severe AS mean gradient 36 mmHg peak 63 mmHg moderate AR stable from 07/19/18 Asymptomatic and very active will f/u echo in 6 months She is due to have CT scan in June for lung nodule. Will change to cardiac CT to look at coronary arteries, aortic root , valve and lung nodule   CAD none documented normal myovue 08/04/18   Lung Nodule: with family history of lung cancer Non smoker f/u hi res CT 09/20/16 ok  Will check with cardiac CT see above  f/u [place]   Aorta:  4.0 cm by CT 09/20/16  see above regarding cardiac CT    COVID-19 Education: The signs and symptoms of COVID-19 were discussed with the patient and how to seek care for testing (follow up with PCP or arrange E-visit).  The importance of social distancing was discussed today.  Time:   Today, I have spent 30 minutes with the patient with telehealth technology discussing the above problems.     Medication Adjustments/Labs and Tests Ordered: Current medicines are reviewed at length with the patient today.  Concerns regarding medicines are outlined above.   Tests Ordered: No orders of the defined types were placed in this encounter.   Medication Changes: No orders of the defined types were placed in this encounter.   Disposition:  Follow up in 6 months with echo to assess AS/AR  Signed, Jenkins Rouge, MD  02/07/2019 6:29 PM    [place]

## 2019-02-09 ENCOUNTER — Other Ambulatory Visit: Payer: Self-pay

## 2019-02-09 ENCOUNTER — Encounter: Payer: Self-pay | Admitting: Cardiovascular Disease

## 2019-02-09 ENCOUNTER — Telehealth (INDEPENDENT_AMBULATORY_CARE_PROVIDER_SITE_OTHER): Payer: Federal, State, Local not specified - PPO | Admitting: Cardiovascular Disease

## 2019-02-09 VITALS — BP 134/79 | HR 93 | Ht 66.0 in | Wt 138.0 lb

## 2019-02-09 DIAGNOSIS — I719 Aortic aneurysm of unspecified site, without rupture: Secondary | ICD-10-CM

## 2019-02-09 DIAGNOSIS — I350 Nonrheumatic aortic (valve) stenosis: Secondary | ICD-10-CM

## 2019-02-09 MED ORDER — METOPROLOL TARTRATE 50 MG PO TABS: ORAL_TABLET | ORAL | 0 refills | Status: DC

## 2019-02-09 NOTE — Patient Instructions (Addendum)
Medication Instructions:   If you need a refill on your cardiac medications before your next appointment, please call your pharmacy.   Lab work:  If you have labs (blood work) drawn today and your tests are completely normal, you will receive your results only by: Marland Kitchen MyChart Message (if you have MyChart) OR . A paper copy in the mail If you have any lab test that is abnormal or we need to change your treatment, we will call you to review the results.  Testing/Procedures: Your physician has requested that you have an echocardiogram in 6 months. Echocardiography is a painless test that uses sound waves to create images of your heart. It provides your doctor with information about the size and shape of your heart and how well your heart's chambers and valves are working. This procedure takes approximately one hour. There are no restrictions for this procedure.  Your physician has requested that you have cardiac CT at the end of June. Cardiac computed tomography (CT) is a painless test that uses an x-ray machine to take clear, detailed pictures of your heart. For further information please visit [email or web address]. Please follow instruction sheet as given.  Follow-Up: At [facility], you and your health needs are our priority.  As part of our continuing mission to provide you with exceptional heart care, we have created designated Provider Care Teams.  These Care Teams include your primary Cardiologist (physician) and Advanced Practice Providers (APPs -  Physician Assistants and Nurse Practitioners) who all work together to provide you with the care you need, when you need it. You will need a follow up appointment in 6 months with echo.  Please call our office 2 months in advance to schedule this appointment.  You may see Jenkins Rouge, MD or one of the following Advanced Practice Providers on your designated Care Team:   Truitt Merle, NP Cecilie Kicks, NP . Kathyrn Drown, NP   Please arrive at  the [facility] main entrance of [facility] at xx:xx AM (30-45 minutes prior to test start time)  [facility] [place], [place] [address] [phone]  Proceed to the [facility] Radiology Department (First Floor).  Please follow these instructions carefully (unless otherwise directed):  On the Night Before the Test: . Be sure to Drink plenty of water. . Do not consume any caffeinated/decaffeinated beverages or chocolate 12 hours prior to your test. . Do not take any antihistamines 12 hours prior to your test. . Take metoprolol (Lopressor) the night before your test.  On the Day of the Test: . Drink plenty of water. Do not drink any water within one hour of the test. . Do not eat any food 4 hours prior to the test. . You may take your regular medications prior to the test.  . Take metoprolol (Lopressor) two hours prior to test.  After the Test: . Drink plenty of water. . After receiving IV contrast, you may experience a mild flushed feeling. This is normal. . On occasion, you may experience a mild rash up to 24 hours after the test. This is not dangerous. If this occurs, you can take Benadryl 25 mg and increase your fluid intake. . If you experience trouble breathing, this can be serious. If it is severe call 911 IMMEDIATELY. If it is mild, please call our office.

## 2019-02-15 DIAGNOSIS — M1811 Unilateral primary osteoarthritis of first carpometacarpal joint, right hand: Secondary | ICD-10-CM | POA: Diagnosis not present

## 2019-02-15 DIAGNOSIS — M65332 Trigger finger, left middle finger: Secondary | ICD-10-CM | POA: Diagnosis not present

## 2019-02-27 DIAGNOSIS — M65332 Trigger finger, left middle finger: Secondary | ICD-10-CM | POA: Diagnosis not present

## 2019-03-01 ENCOUNTER — Telehealth: Payer: Self-pay | Admitting: Cardiovascular Disease

## 2019-03-01 NOTE — Telephone Encounter (Signed)
Left message to call and schedule ct in June per Dr. Johnsie Cancel.

## 2019-03-06 ENCOUNTER — Other Ambulatory Visit: Payer: Self-pay

## 2019-03-06 DIAGNOSIS — I350 Nonrheumatic aortic (valve) stenosis: Secondary | ICD-10-CM

## 2019-03-06 DIAGNOSIS — Z01812 Encounter for preprocedural laboratory examination: Secondary | ICD-10-CM

## 2019-03-06 NOTE — Telephone Encounter (Signed)
Follow up ° ° °Patient is returning your call. Please call. ° ° ° °

## 2019-03-06 NOTE — Progress Notes (Signed)
Order for BMET prior to CT

## 2019-03-30 ENCOUNTER — Ambulatory Visit: Payer: Federal, State, Local not specified - PPO | Admitting: Cardiovascular Disease

## 2019-04-10 ENCOUNTER — Other Ambulatory Visit: Payer: Self-pay

## 2019-04-10 ENCOUNTER — Other Ambulatory Visit: Payer: Federal, State, Local not specified - PPO

## 2019-04-10 DIAGNOSIS — I350 Nonrheumatic aortic (valve) stenosis: Secondary | ICD-10-CM

## 2019-04-10 DIAGNOSIS — Z01812 Encounter for preprocedural laboratory examination: Secondary | ICD-10-CM

## 2019-04-10 LAB — BASIC METABOLIC PANEL
BUN/Creatinine Ratio: 26 (ref 12–28)
BUN: 16 mg/dL (ref 8–27)
CO2: 24 mmol/L (ref 20–29)
Calcium: 10.1 mg/dL (ref 8.7–10.3)
Chloride: 98 mmol/L (ref 96–106)
Creatinine, Ser: 0.61 mg/dL (ref 0.57–1.00)
GFR calc Af Amer: 110 mL/min/{1.73_m2} (ref >59)
GFR calc non Af Amer: 95 mL/min/{1.73_m2} (ref >59)
Glucose: 84 mg/dL (ref 65–99)
Potassium: 4.7 mmol/L (ref 3.5–5.2)
Sodium: 137 mmol/L (ref 134–144)

## 2019-04-13 ENCOUNTER — Telehealth (HOSPITAL_COMMUNITY): Payer: Self-pay | Admitting: Emergency Medicine

## 2019-04-13 NOTE — Telephone Encounter (Signed)
Left message on voicemail with name and callback number Shadman Tozzi RN Navigator Cardiac Imaging [facility] [phone] Office [phone] Cell  

## 2019-04-16 ENCOUNTER — Telehealth: Payer: Self-pay

## 2019-04-16 ENCOUNTER — Other Ambulatory Visit: Payer: Self-pay

## 2019-04-16 ENCOUNTER — Ambulatory Visit (HOSPITAL_COMMUNITY)
Admission: RE | Admit: 2019-04-16 | Discharge: 2019-04-16 | Disposition: A | Discharge status: Home / Self Care | Payer: Federal, State, Local not specified - PPO | Source: Ambulatory Visit | Attending: Cardiovascular Disease | Admitting: Cardiovascular Disease

## 2019-04-16 ENCOUNTER — Ambulatory Visit (HOSPITAL_COMMUNITY): Admission: RE | Admit: 2019-04-16 | Payer: Federal, State, Local not specified - PPO | Source: Ambulatory Visit

## 2019-04-16 ENCOUNTER — Other Ambulatory Visit (HOSPITAL_COMMUNITY): Payer: Self-pay | Admitting: Cardiovascular Disease

## 2019-04-16 DIAGNOSIS — I350 Nonrheumatic aortic (valve) stenosis: Secondary | ICD-10-CM

## 2019-04-16 DIAGNOSIS — I719 Aortic aneurysm of unspecified site, without rupture: Secondary | ICD-10-CM | POA: Insufficient documentation

## 2019-04-16 DIAGNOSIS — K5730 Diverticulosis of large intestine without perforation or abscess without bleeding: Secondary | ICD-10-CM | POA: Diagnosis not present

## 2019-04-16 DIAGNOSIS — Z01818 Encounter for other preprocedural examination: Secondary | ICD-10-CM | POA: Diagnosis not present

## 2019-04-16 MED ORDER — IOHEXOL 350 MG/ML SOLN
100.0000 mL | Freq: Once | INTRAVENOUS | Status: AC | PRN
  Administered 2019-04-16: 100 mL via INTRAVENOUS

## 2019-04-16 MED ORDER — NITROGLYCERIN 0.4 MG SL SUBL
SUBLINGUAL_TABLET | SUBLINGUAL | Status: AC
  Filled 2019-04-16: qty 2

## 2019-04-16 MED ORDER — NITROGLYCERIN 0.4 MG SL SUBL
0.8000 mg | SUBLINGUAL_TABLET | Freq: Once | SUBLINGUAL | Status: AC
  Administered 2019-04-16: 0.8 mg via SUBLINGUAL
  Filled 2019-04-16: qty 25

## 2019-04-16 MED ORDER — METOPROLOL TARTRATE 5 MG/5ML IV SOLN
5.0000 mg | INTRAVENOUS | Status: DC | PRN
  Filled 2019-04-16: qty 5

## 2019-04-16 NOTE — Telephone Encounter (Signed)
Called patient and informed her of results. Patient has f/u appt with Dr. Johnsie Cancel in September.

## 2019-04-16 NOTE — Progress Notes (Signed)
Pt tolerated exam without incident.  PIV removed and dressing applied.  Discharge instructions discussed and handouts given to patient.  All questions answered.  Pt discharged

## 2019-04-16 NOTE — Telephone Encounter (Signed)
-----   Message from Josue Hector, MD sent at 04/16/2019  2:55 PM EDT ----- She would be a candidate for TAVR Aortic root only mildly dilated should have f/u with me 3 months if not due already Coronary arteries look good as well

## 2019-04-16 NOTE — Discharge Instructions (Signed)
Cardiac CT Angiogram  A cardiac CT angiogram is a procedure to look at the heart and the area around the heart. It may be done to help find the cause of chest pains or other symptoms of heart disease. During this procedure, a large X-ray machine, called a CT scanner, takes detailed pictures of the heart and the surrounding area after a dye (contrast material) has been injected into blood vessels in the area. The procedure is also sometimes called a coronary CT angiogram, coronary artery scanning, or CTA. A cardiac CT angiogram allows the health care provider to see how well blood is flowing to and from the heart. The health care provider will be able to see if there are any problems, such as:  Blockage or narrowing of the coronary arteries in the heart.  Fluid around the heart.  Signs of weakness or disease in the muscles, valves, and tissues of the heart. Tell a health care provider about:  Any allergies you have. This is especially important if you have had a previous allergic reaction to contrast dye.  All medicines you are taking, including vitamins, herbs, eye drops, creams, and over-the-counter medicines.  Any blood disorders you have.  Any surgeries you have had.  Any medical conditions you have.  Whether you are pregnant or may be pregnant.  Any anxiety disorders, chronic pain, or other conditions you have that may increase your stress or prevent you from lying still. What are the risks? Generally, this is a safe procedure. However, problems may occur, including:  Bleeding.  Infection.  Allergic reactions to medicines or dyes.  Damage to other structures or organs.  Kidney damage from the dye or contrast that is used.  Increased risk of cancer from radiation exposure. This risk is low. Talk with your health care provider about: ? The risks and benefits of testing. ? How you can receive the lowest dose of radiation. What happens before the procedure?  Wear  comfortable clothing and remove any jewelry, glasses, dentures, and hearing aids.  Follow instructions from your health care provider about eating and drinking. This may include: ? For 12 hours before the test -- avoid caffeine. This includes tea, coffee, soda, energy drinks, and diet pills. Drink plenty of water or other fluids that do not have caffeine in them. Being well-hydrated can prevent complications. ? For 4-6 hours before the test -- stop eating and drinking. The contrast dye can cause nausea, but this is less likely if your stomach is empty.  Ask your health care provider about changing or stopping your regular medicines. This is especially important if you are taking diabetes medicines, blood thinners, or medicines to treat erectile dysfunction. What happens during the procedure?  Hair on your chest may need to be removed so that small sticky patches called electrodes can be placed on your chest. These will transmit information that helps to monitor your heart during the test.  An IV tube will be inserted into one of your veins.  You might be given a medicine to control your heart rate during the test. This will help to ensure that good images are obtained.  You will be asked to lie on an exam table. This table will slide in and out of the CT machine during the procedure.  Contrast dye will be injected into the IV tube. You might feel warm, or you may get a metallic taste in your mouth.  You will be given a medicine (nitroglycerin) to relax (dilate) the arteries  in your heart.  The table that you are lying on will move into the CT machine tunnel for the scan.  The person running the machine will give you instructions while the scans are being done. You may be asked to: ? Keep your arms above your head. ? Hold your breath. ? Stay very still, even if the table is moving.  When the scanning is complete, you will be moved out of the machine.  The IV tube will be removed. The  procedure may vary among health care providers and hospitals. What happens after the procedure?  You might feel warm, or you may get a metallic taste in your mouth from the contrast dye.  You may have a headache from the nitroglycerin.  After the procedure, drink water or other fluids to wash (flush) the contrast material out of your body.  Contact a health care provider if you have any symptoms of allergy to the contrast. These symptoms include: ? Shortness of breath. ? Rash or hives. ? A racing heartbeat.  Most people can return to their normal activities right after the procedure. Ask your health care provider what activities are safe for you.  It is up to you to get the results of your procedure. Ask your health care provider, or the department that is doing the procedure, when your results will be ready. Summary  A cardiac CT angiogram is a procedure to look at the heart and the area around the heart. It may be done to help find the cause of chest pains or other symptoms of heart disease.  During this procedure, a large X-ray machine, called a CT scanner, takes detailed pictures of the heart and the surrounding area after a dye (contrast material) has been injected into blood vessels in the area.  Ask your health care provider about changing or stopping your regular medicines before the procedure. This is especially important if you are taking diabetes medicines, blood thinners, or medicines to treat erectile dysfunction.  After the procedure, drink water or other fluids to wash (flush) the contrast material out of your body. This information is not intended to replace advice given to you by your health care provider. Make sure you discuss any questions you have with your health care provider. Document Released: 09/23/2008 Document Revised: 08/30/2016 Document Reviewed: 08/30/2016 Elsevier Interactive Patient Education  2019 [employer] Need to Know About IV Contrast  Material IV contrast material is most often a fluid that is used with some imaging tests. Contrast material is injected into your body through a vein to help your health care providers see your organs and tissues more clearly. It may be used with:  X-ray.  MRI.  CT.  Ultrasound. Contrast material is used when your health care providers need a detailed look at organs, tissues, or blood vessels that may not show up with the standard test. IV contrast may be used for imaging tests that examine:  Muscles, skin, and fat.  Breasts.  Brain.  Digestive tract.  Heart.  Liver.  Lungs and many other internal organs. What are the risks of using IV contrast material? The risks of using IV contrast material include:  Headache.  Itching, skin rash, and hives.  Allergic reactions.  Nausea and vomiting.  Wheezing or difficulty breathing.  Abnormal heart rate.  Blood pressure changes.  Throat swelling.  Kidney damage. These complications are more likely to occur in people who:  Have kidney failure.  Have liver problems.  Have certain heart problems, including: ? Heart failure. ? Heart attack. ? Heart infection. ? Heart valve problems.  Abuse alcohol.  Have allergies or asthma.  Are dehydrated.  Have sickle cell anemia or similar problems.  Have had trouble with IV contrast material in the past.  Take certain medicines, such as: ? Metformin. ? NSAIDs. ? Beta blockers. ? Interleukin-2. How do I prepare for my test with IV contrast material?  Follow instructions from your health care provider about eating or drinking restrictions.  Ask your health care provider about changing or stopping your regular medicines. This is especially important if you are taking diabetes medicines or blood thinners.  Tell your health care provider about: ? Any previous illnesses, surgeries, or pre-existing medical conditions. ? Whether you are pregnant or may be  pregnant. ? Whether you are breastfeeding. Most contrast agents are safe for use in breastfeeding women.  You may have a physical exam to determine any potential risks.  Ask if you will be given a medicine (sedative) to help you relax during the procedure. If so, plan to have someone take you home after test. What happens during the test with IV contrast material?   You may be given a sedative to help you relax.  A needle will be inserted into one of your veins to administer the IV contrast material.  You may feel warmth or flushing as the material enters your bloodstream.  You may have a metallic taste in your mouth for a few minutes.  The needle may cause some discomfort and bruising.  After the contrast material is in your body, the imaging test will be done. The procedure may vary among health care providers and hospitals. What happens after the test with IV contrast material?  You may be asked to drink water or other fluids to wash (flush) the contrast material out of your body.  Drink enough fluid to keep your urine pale yellow.  Do not drive for 24 hours if you received a sedative.  It is your responsibility to get your test results. Ask your health care provider or the department performing the test when your results will be ready. Contact a health care provider if:  You have redness, swelling, or pain near your IV site. Get help right away if:  You have an abnormal heart rhythm.  You have trouble breathing.  You have: ? Chest pain. ? Pain in your back, neck, arm, jaw, or stomach. ? Nausea or sweating. ? Hives or a rash.  You start shaking and cannot stop. These symptoms may represent a serious problem that is an emergency. Do not wait to see if the symptoms will go away. Get medical help right away. Call your local emergency services (911 in the [place] Do not drive yourself to the hospital. Summary  IV contrast may be used for imaging tests to help your  health care providers see your organs and tissues more clearly.  Tell your health care provider if you are pregnant or may be pregnant.  During the procedure, you may feel warmth or flushing as the material enters your bloodstream.  After the procedure, drink enough fluid to keep your urine pale yellow. This information is not intended to replace advice given to you by your health care provider. Make sure you discuss any questions you have with your health care provider. Document Released: 09/29/2009 Document Revised: 06/05/2018 Document Reviewed: 06/18/2015 Elsevier Interactive Patient Education  2019 [employer].

## 2019-04-17 ENCOUNTER — Telehealth: Payer: Self-pay | Admitting: Emergency Medicine

## 2019-04-17 NOTE — Telephone Encounter (Signed)
Please let the patient know that I reviewed her CT scan of the chest that was done 04/16/2019 and that it was adequate to give [place] a good comparison to her previous scan from 2018.  She does not need to go have an additional scan for [place] as planned-we can cancel it  Please let her know that the pulmonary nodules that we have been following her all for the most part stable, most are actually smaller than 2018.  Guidelines indicate that she should have another repeat scan in 2 years to ensure that they remain stable.  Thanks

## 2019-04-18 NOTE — Telephone Encounter (Signed)
Attempted to call pt to discuss results of CT but unable to reach. Left message for pt to return call.

## 2019-04-18 NOTE — Telephone Encounter (Signed)
Pt returned call. I provided her with the results of CT per RB and pt verbalized understanding. Nothing further needed.

## 2019-07-04 NOTE — Progress Notes (Signed)
Patient ID: [name], female   DOB: Feb 24, 1956, 63 y.o.   MRN: [id number]     63 y.o. f/u for aortic valve disease.  History of palpitations intolerant to beta blocker controlled with calcium blocker. HTN well controlled Anxiety and history of renal cell CA followed at [facility]. Cares for two grand children. Normal ETT 01/20/16. Cardiac CT 02/28/16 with calcium score only 17 no obstructive disease aortic root 4.0 cm  Carotids plaque no stenosis 12/24/16 has referred AS murmur. TTE done 01/13/18 EF 60-65% mean gradient 38 mmHg peak 78 mmHg and moderate AR.   She is very active walking 11 miles/day No pre syncope, dyspnea or chest pain She works as a Development worker, community carrier   TTE reviewed from 07/19/18  Mean gradient 37 mmHg peak 69 mmHg peak velocity 4.1 m/sec and DVI .26 and moderate AR   Myovue 08/04/18 no ischemia EF 64% Echo 12/27/18 EF >65% moderate AR , moderate to severe AS Mean gradient 36 mmHg peak 63 mmHg DVI 0.26  CT for TAVR showed suitable for 23 mm Sapien valve 04/16/19 with annular area of 379 mm2 Aortic root 4.0 cm no obstructive CAD  She continues to be asymptomatic needs right hand surgery Has had  dupuytren's on left Gave her Dr Vanetta Shawl name  ROS: Denies fever, malais, weight loss, blurry vision, decreased visual acuity, cough, sputum, SOB, hemoptysis, pleuritic pain, palpitaitons, heartburn, abdominal pain, melena, lower extremity edema, claudication, or rash.  All other systems reviewed and negative   General: There were no vitals taken for this visit. Affect appropriate Healthy:  appears stated age 63: normal Neck supple with no adenopathy JVP normal no bruits no thyromegaly Lungs clear with no wheezing and good diaphragmatic motion Heart:  S1/S2 diminished "[place]king" AS murmur,Diastolic AR murmur  no rub, gallop or click PMI normal Abdomen: benighn, BS positve, no tenderness, no AAA no bruit.  No HSM or HJR Distal pulses intact with no bruits No edema Neuro non-focal Skin  warm and dry No muscular weakness    Medications Current Outpatient Medications  Medication Sig Dispense Refill  . amLODipine (NORVASC) 5 MG tablet Take 1 tablet (5 mg total) by mouth daily. 90 tablet 3  . fluticasone (FLONASE) 50 MCG/ACT nasal spray Place 2 sprays into both nostrils daily as needed. For allergies  1  . metoprolol tartrate (LOPRESSOR) 50 MG tablet Take one tablet by mouth the night before CT and take 1 tablet by mouth the morning of CT. 2 tablet 0  . naproxen (NAPROSYN) 500 MG tablet Take 500 mg by mouth as needed for mild pain.    Marland Kitchen omeprazole (PRILOSEC) 40 MG capsule Take 40 mg by mouth daily as needed (heartburn).     No current facility-administered medications for this visit.     Allergies Cyprodenate, Diphenhydramine hcl, Codeine, and Morphine and related  Family History: Family History  Problem Relation Age of Onset  . Cancer Mother        Lung  . Cancer Father        Lung  . Hypertension Father   . Cancer Sister        Lung  . Heart attack Paternal Grandfather   . Heart attack Paternal Aunt     Social History: Social History   Socioeconomic History  . Marital status: Married    Spouse name: Not on file  . Number of children: Not on file  . Years of education: Not on file  . Highest education level: Not on  file  Occupational History  . Not on file  Social Needs  . Financial resource strain: Not on file  . Food insecurity    Worry: Not on file    Inability: Not on file  . Transportation needs    Medical: Not on file    Non-medical: Not on file  Tobacco Use  . Smoking status: Former Smoker    Packs/day: 1.00    Years: 10.00    Pack years: 10.00    Types: Cigarettes    Quit date: 10/25/2005    Years since quitting: 13.6  . Smokeless tobacco: Never Used  . Tobacco comment: quit around 2007   Substance and Sexual Activity  . Alcohol use: Yes    Comment: occ  . Drug use: No  . Sexual activity: Not on file  Lifestyle  . Physical  activity    Days per week: Not on file    Minutes per session: Not on file  . Stress: Not on file  Relationships  . Social Herbalist on phone: Not on file    Gets together: Not on file    Attends religious service: Not on file    Active member of club or organization: Not on file    Attends meetings of clubs or organizations: Not on file    Relationship status: Not on file  . Intimate partner violence    Fear of current or ex partner: Not on file    Emotionally abused: Not on file    Physically abused: Not on file    Forced sexual activity: Not on file  Other Topics Concern  . Not on file  Social History Narrative  . Not on file    Electrocardiogram:  Low atrial focus regular rhyhthm rate 93 normal QRS complexes  08/26/15  SR rate 78 normal 01/27/18  NSR rate 83 LAE otherwise normal 07/13/19 SR rate 90 normal   Assessment and Plan  Palpitations :  Resolved observe  HTN:  Well controlled.  Continue current medications and low sodium Dash type diet.   AS:   Moderate AR and moderate to severe AS DVI 0.26 f/u echo in 6 months Or sooner if she becomes symptomatic. 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 Transferred murmur from AS  Duplex personally reviewed 12/24/16 plaque no stenosis    F/U with me in  6 months with echo    Jenkins Rouge

## 2019-07-13 ENCOUNTER — Other Ambulatory Visit: Payer: Self-pay

## 2019-07-13 ENCOUNTER — Ambulatory Visit: Payer: Federal, State, Local not specified - PPO | Admitting: Cardiovascular Disease

## 2019-07-13 ENCOUNTER — Encounter: Payer: Self-pay | Admitting: Cardiovascular Disease

## 2019-07-13 VITALS — BP 136/74 | HR 90 | Ht 66.0 in | Wt 154.0 lb

## 2019-07-13 DIAGNOSIS — I350 Nonrheumatic aortic (valve) stenosis: Secondary | ICD-10-CM

## 2019-07-13 DIAGNOSIS — R002 Palpitations: Secondary | ICD-10-CM

## 2019-07-13 NOTE — Patient Instructions (Signed)

## 2019-08-14 ENCOUNTER — Ambulatory Visit (HOSPITAL_COMMUNITY): Payer: Federal, State, Local not specified - PPO | Attending: Cardiology

## 2019-08-14 ENCOUNTER — Other Ambulatory Visit: Payer: Self-pay

## 2019-08-14 DIAGNOSIS — I350 Nonrheumatic aortic (valve) stenosis: Secondary | ICD-10-CM | POA: Insufficient documentation

## 2019-08-14 DIAGNOSIS — I719 Aortic aneurysm of unspecified site, without rupture: Secondary | ICD-10-CM | POA: Diagnosis not present

## 2019-08-15 NOTE — Progress Notes (Signed)
Patient ID: [name], female   DOB: 07-05-1956, 63 y.o.   MRN: [id number]     63 y.o. f/u for aortic valve disease.  History of palpitations intolerant to beta blocker controlled with calcium blocker. HTN well controlled Anxiety and history of renal cell CA followed at [facility]. Cares for two grand children. Normal ETT 01/20/16. Cardiac CT 02/28/16 with calcium score only 17 no obstructive disease aortic root 4.0 cm  Carotids plaque no stenosis 12/24/16 has referred AS murmur. TTE done 01/13/18 EF 60-65% mean gradient 38 mmHg peak 78 mmHg and moderate AR.   She is very active walking 11 miles/day No pre syncope, dyspnea or chest pain She works as a Development worker, community carrier   TTE reviewed from 07/19/18  Mean gradient 37 mmHg peak 69 mmHg peak velocity 4.1 m/sec and DVI .26 and moderate AR   Myovue 08/04/18 no ischemia EF 64% Echo 12/27/18 EF >65% moderate AR , moderate to severe AS Mean gradient 36 mmHg peak 63 mmHg DVI 0.26  CT for TAVR showed suitable for 23 mm Sapien valve 04/16/19 with annular area of 379 mm2 Aortic root 4.0 cm no obstructive CAD  She continues to be asymptomatic needs right hand surgery Has had  dupuytren's on left Gave her Dr Vanetta Shawl name  Echo reviewed 08/14/19 EF 60-65% Ao 39 mm moderate AR mean gradient 77 mmHg peak 108 mmHg   Long discussion about rapid evolution. She walked over 76 miles for work last week No dyspnea or chest pain Has had some lightheadedness which is new  Risks of right and left cath discussed including stroke, contrast reaction, emergency surgery , bleeding and MI Willing To proceed   ROS: Denies fever, malais, weight loss, blurry vision, decreased visual acuity, cough, sputum, SOB, hemoptysis, pleuritic pain, palpitaitons, heartburn, abdominal pain, melena, lower extremity edema, claudication, or rash.  All other systems reviewed and negative   General: BP 140/72   Pulse 87   Ht 5\' 6"  (1.676 m)   Wt 148 lb (67.1 kg)   SpO2 98%   BMI 23.89 kg/m   Affect appropriate Healthy:  appears stated age 9: normal Neck supple with no adenopathy JVP normal no bruits no thyromegaly Lungs clear with no wheezing and good diaphragmatic motion Heart:  S1/S2 diminished "[place]" AS murmur,Diastolic AR murmur  no rub, gallop or click PMI normal Abdomen: benighn, BS positve, no tenderness, no AAA no bruit.  No HSM or HJR Distal pulses intact with no bruits No edema Neuro non-focal Skin warm and dry No muscular weakness    Medications Current Outpatient Medications  Medication Sig Dispense Refill  . amLODipine (NORVASC) 5 MG tablet Take 1 tablet (5 mg total) by mouth daily. 90 tablet 3  . fluticasone (FLONASE) 50 MCG/ACT nasal spray Place 2 sprays into both nostrils daily as needed. For allergies  1  . naproxen (NAPROSYN) 500 MG tablet Take 500 mg by mouth as needed for mild pain.    Marland Kitchen omeprazole (PRILOSEC) 40 MG capsule Take 40 mg by mouth daily as needed (heartburn).     No current facility-administered medications for this visit.     Allergies Cyprodenate, Diphenhydramine hcl, Codeine, and Morphine and related  Family History: Family History  Problem Relation Age of Onset  . Cancer Mother        Lung  . Cancer Father        Lung  . Hypertension Father   . Cancer Sister        Lung  .  Heart attack Paternal Grandfather   . Heart attack Paternal Aunt     Social History: Social History   Socioeconomic History  . Marital status: Married    Spouse name: Not on file  . Number of children: Not on file  . Years of education: Not on file  . Highest education level: Not on file  Occupational History  . Not on file  Social Needs  . Financial resource strain: Not on file  . Food insecurity    Worry: Not on file    Inability: Not on file  . Transportation needs    Medical: Not on file    Non-medical: Not on file  Tobacco Use  . Smoking status: Former Smoker    Packs/day: 1.00    Years: 10.00    Pack years: 10.00     Types: Cigarettes    Quit date: 10/25/2005    Years since quitting: 13.8  . Smokeless tobacco: Never Used  . Tobacco comment: quit around 2007   Substance and Sexual Activity  . Alcohol use: Yes    Comment: occ  . Drug use: No  . Sexual activity: Not on file  Lifestyle  . Physical activity    Days per week: Not on file    Minutes per session: Not on file  . Stress: Not on file  Relationships  . Social Herbalist on phone: Not on file    Gets together: Not on file    Attends religious service: Not on file    Active member of club or organization: Not on file    Attends meetings of clubs or organizations: Not on file    Relationship status: Not on file  . Intimate partner violence    Fear of current or ex partner: Not on file    Emotionally abused: Not on file    Physically abused: Not on file    Forced sexual activity: Not on file  Other Topics Concern  . Not on file  Social History Narrative  . Not on file    Electrocardiogram:  Low atrial focus regular rhyhthm rate 93 normal QRS complexes  08/26/15  SR rate 78 normal 01/27/18  NSR rate 83 LAE otherwise normal 07/13/19 SR rate 90 normal   Assessment and Plan  Palpitations :  Resolved observe  HTN:  Well controlled.  Continue current medications and low sodium Dash type diet.    AS:   Rapid progression she doe not want open surgery will arrange TAVR scans and right and left cath with Dr Tamala Julian next wendsday  Risks including stroke , bleeding , contrast reaction , MI and need for emergency surgery discussed Willing to proceed  Will arrange f/u with Dr Cyndia Bent for surgical consult post cath. 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 Transferred murmur from AS  Duplex personally reviewed 12/24/16  plaque no stenosis    F/U with me post testing Refer Dr Cyndia Bent post cath for TAVR/AVR consideration     Jenkins Rouge

## 2019-08-15 NOTE — H&P (View-Only) (Signed)
Patient ID: [name], female   DOB: 1956/09/22, 63 y.o.   MRN: [id number]     63 y.o. f/u for aortic valve disease.  History of palpitations intolerant to beta blocker controlled with calcium blocker. HTN well controlled Anxiety and history of renal cell CA followed at [facility]. Cares for two grand children. Normal ETT 01/20/16. Cardiac CT 02/28/16 with calcium score only 17 no obstructive disease aortic root 4.0 cm  Carotids plaque no stenosis 12/24/16 has referred AS murmur. TTE done 01/13/18 EF 60-65% mean gradient 38 mmHg peak 78 mmHg and moderate AR.   She is very active walking 11 miles/day No pre syncope, dyspnea or chest pain She works as a Development worker, community carrier   TTE reviewed from 07/19/18  Mean gradient 37 mmHg peak 69 mmHg peak velocity 4.1 m/sec and DVI .26 and moderate AR   Myovue 08/04/18 no ischemia EF 64% Echo 12/27/18 EF >65% moderate AR , moderate to severe AS Mean gradient 36 mmHg peak 63 mmHg DVI 0.26  CT for TAVR showed suitable for 23 mm Sapien valve 04/16/19 with annular area of 379 mm2 Aortic root 4.0 cm no obstructive CAD  She continues to be asymptomatic needs right hand surgery Has had  dupuytren's on left Gave her Dr Vanetta Shawl name  Echo reviewed 08/14/19 EF 60-65% Ao 39 mm moderate AR mean gradient 77 mmHg peak 108 mmHg   Long discussion about rapid evolution. She walked over 47 miles for work last week No dyspnea or chest pain Has had some lightheadedness which is new  Risks of right and left cath discussed including stroke, contrast reaction, emergency surgery , bleeding and MI Willing To proceed   ROS: Denies fever, malais, weight loss, blurry vision, decreased visual acuity, cough, sputum, SOB, hemoptysis, pleuritic pain, palpitaitons, heartburn, abdominal pain, melena, lower extremity edema, claudication, or rash.  All other systems reviewed and negative   General: BP 140/72   Pulse 87   Ht 5\' 6"  (1.676 m)   Wt 148 lb (67.1 kg)   SpO2 98%   BMI 23.89 kg/m   Affect appropriate Healthy:  appears stated age 8: normal Neck supple with no adenopathy JVP normal no bruits no thyromegaly Lungs clear with no wheezing and good diaphragmatic motion Heart:  S1/S2 diminished "[place]" AS murmur,Diastolic AR murmur  no rub, gallop or click PMI normal Abdomen: benighn, BS positve, no tenderness, no AAA no bruit.  No HSM or HJR Distal pulses intact with no bruits No edema Neuro non-focal Skin warm and dry No muscular weakness    Medications Current Outpatient Medications  Medication Sig Dispense Refill  . amLODipine (NORVASC) 5 MG tablet Take 1 tablet (5 mg total) by mouth daily. 90 tablet 3  . fluticasone (FLONASE) 50 MCG/ACT nasal spray Place 2 sprays into both nostrils daily as needed. For allergies  1  . naproxen (NAPROSYN) 500 MG tablet Take 500 mg by mouth as needed for mild pain.    Marland Kitchen omeprazole (PRILOSEC) 40 MG capsule Take 40 mg by mouth daily as needed (heartburn).     No current facility-administered medications for this visit.     Allergies Cyprodenate, Diphenhydramine hcl, Codeine, and Morphine and related  Family History: Family History  Problem Relation Age of Onset  . Cancer Mother        Lung  . Cancer Father        Lung  . Hypertension Father   . Cancer Sister        Lung  .  Heart attack Paternal Grandfather   . Heart attack Paternal Aunt     Social History: Social History   Socioeconomic History  . Marital status: Married    Spouse name: Not on file  . Number of children: Not on file  . Years of education: Not on file  . Highest education level: Not on file  Occupational History  . Not on file  Social Needs  . Financial resource strain: Not on file  . Food insecurity    Worry: Not on file    Inability: Not on file  . Transportation needs    Medical: Not on file    Non-medical: Not on file  Tobacco Use  . Smoking status: Former Smoker    Packs/day: 1.00    Years: 10.00    Pack years: 10.00     Types: Cigarettes    Quit date: 10/25/2005    Years since quitting: 13.8  . Smokeless tobacco: Never Used  . Tobacco comment: quit around 2007   Substance and Sexual Activity  . Alcohol use: Yes    Comment: occ  . Drug use: No  . Sexual activity: Not on file  Lifestyle  . Physical activity    Days per week: Not on file    Minutes per session: Not on file  . Stress: Not on file  Relationships  . Social Herbalist on phone: Not on file    Gets together: Not on file    Attends religious service: Not on file    Active member of club or organization: Not on file    Attends meetings of clubs or organizations: Not on file    Relationship status: Not on file  . Intimate partner violence    Fear of current or ex partner: Not on file    Emotionally abused: Not on file    Physically abused: Not on file    Forced sexual activity: Not on file  Other Topics Concern  . Not on file  Social History Narrative  . Not on file    Electrocardiogram:  Low atrial focus regular rhyhthm rate 93 normal QRS complexes  08/26/15  SR rate 78 normal 01/27/18  NSR rate 83 LAE otherwise normal 07/13/19 SR rate 90 normal   Assessment and Plan  Palpitations :  Resolved observe  HTN:  Well controlled.  Continue current medications and low sodium Dash type diet.    AS:   Rapid progression she doe not want open surgery will arrange TAVR scans and right and left cath with Dr Tamala Julian next wendsday  Risks including stroke , bleeding , contrast reaction , MI and need for emergency surgery discussed Willing to proceed  Will arrange f/u with Dr Cyndia Bent for surgical consult post cath. 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 Transferred murmur from AS  Duplex personally reviewed 12/24/16  plaque no stenosis    F/U with me post testing Refer Dr Cyndia Bent post cath for TAVR/AVR consideration     Jenkins Rouge

## 2019-08-17 ENCOUNTER — Other Ambulatory Visit: Payer: Self-pay

## 2019-08-17 ENCOUNTER — Ambulatory Visit: Payer: Federal, State, Local not specified - PPO | Admitting: Cardiovascular Disease

## 2019-08-17 ENCOUNTER — Telehealth: Payer: Self-pay | Admitting: Cardiovascular Disease

## 2019-08-17 ENCOUNTER — Encounter: Payer: Self-pay | Admitting: Cardiovascular Disease

## 2019-08-17 VITALS — BP 140/72 | HR 87 | Ht 66.0 in | Wt 148.0 lb

## 2019-08-17 DIAGNOSIS — I350 Nonrheumatic aortic (valve) stenosis: Secondary | ICD-10-CM | POA: Diagnosis not present

## 2019-08-17 LAB — BASIC METABOLIC PANEL
BUN/Creatinine Ratio: 29 — ABNORMAL HIGH (ref 12–28)
BUN: 16 mg/dL (ref 8–27)
CO2: 26 mmol/L (ref 20–29)
Calcium: 10.2 mg/dL (ref 8.7–10.3)
Chloride: 99 mmol/L (ref 96–106)
Creatinine, Ser: 0.55 mg/dL — ABNORMAL LOW (ref 0.57–1.00)
GFR calc Af Amer: 114 mL/min/{1.73_m2} (ref >59)
GFR calc non Af Amer: 99 mL/min/{1.73_m2} (ref >59)
Glucose: 97 mg/dL (ref 65–99)
Potassium: 4.4 mmol/L (ref 3.5–5.2)
Sodium: 137 mmol/L (ref 134–144)

## 2019-08-17 LAB — CBC WITH DIFFERENTIAL/PLATELET
Basophils Absolute: 0.1 10*3/uL (ref 0.0–0.2)
Basos: 1 %
EOS (ABSOLUTE): 0.1 10*3/uL (ref 0.0–0.4)
Eos: 2 %
Hematocrit: 39.9 % (ref 34.0–46.6)
Hemoglobin: 13.2 g/dL (ref 11.1–15.9)
Immature Grans (Abs): 0 10*3/uL (ref 0.0–0.1)
Immature Granulocytes: 0 %
Lymphocytes Absolute: 1.4 10*3/uL (ref 0.7–3.1)
Lymphs: 25 %
MCH: 29 pg (ref 26.6–33.0)
MCHC: 33.1 g/dL (ref 31.5–35.7)
MCV: 88 fL (ref 79–97)
Monocytes Absolute: 0.5 10*3/uL (ref 0.1–0.9)
Monocytes: 9 %
Neutrophils Absolute: 3.6 10*3/uL (ref 1.4–7.0)
Neutrophils: 63 %
Platelets: 269 10*3/uL (ref 150–450)
RBC: 4.55 x10E6/uL (ref 3.77–5.28)
RDW: 12.6 % (ref 11.7–15.4)
WBC: 5.8 10*3/uL (ref 3.4–10.8)

## 2019-08-17 NOTE — Telephone Encounter (Signed)
Left message for patient to call back  

## 2019-08-17 NOTE — Patient Instructions (Addendum)
Medication Instructions:   *If you need a refill on your cardiac medications before your next appointment, please call your pharmacy*  Lab Work: Your physician recommends that you have lab work today. BMET and CBC  If you have labs (blood work) drawn today and your tests are completely normal, you will receive your results only by: Marland Kitchen MyChart Message (if you have MyChart) OR . A paper copy in the mail If you have any lab test that is abnormal or we need to change your treatment, we will call you to review the results.  Testing/Procedures: Your physician has requested that you have a cardiac catheterization. Cardiac catheterization is used to diagnose and/or treat various heart conditions. Doctors may recommend this procedure for a number of different reasons. The most common reason is to evaluate chest pain. Chest pain can be a symptom of coronary artery disease (CAD), and cardiac catheterization can show whether plaque is narrowing or blocking your heart's arteries. This procedure is also used to evaluate the valves, as well as measure the blood flow and oxygen levels in different parts of your heart. For further information please visit [facility]uge[facility]iesta[facility]tn. Please follow instruction sheet, as given.  Follow-Up: At [facility], you and your health needs are our priority.  As part of our continuing mission to provide you with exceptional heart care, we have created designated Provider Care Teams.  These Care Teams include your primary Cardiologist (physician) and Advanced Practice Providers (APPs -  Physician Assistants and Nurse Practitioners) who all work together to provide you with the care you need, when you need it.  Your next appointment:   3 weeks  The format for your next appointment:   In Person  Provider:   You may see Jenkins Rouge, MD or one of the following Advanced Practice Providers on your designated Care Team:    Truitt Merle, NP  Cecilie Kicks, NP  Kathyrn Drown, NP    You have been referred with Dr. Cyndia Bent after cardiac catheterization.    [place] OFFICE [place] [place] [address] Dept: [phone] Loc: [phone]  [name]  08/17/2019  You are scheduled for a Cardiac Catheterization on Wednesday, October 28 with Dr. Daneen Schick.  1. Please arrive at the [facility] (Main Entrance A) at [facility]: [address] [place], [place] [address] at 8:00 AM (This time is two hours before your procedure to ensure your preparation). Free valet parking service is available.   Special note: Every effort is made to have your procedure done on time. Please understand that emergencies sometimes delay scheduled procedures.  2. Diet: Do not eat solid foods after midnight.  The patient may have clear liquids until 5am upon the day of the procedure.  3. Labs: Done at office visit.  4. Medication instructions in preparation for your procedure:   Contrast Allergy: No  On the morning of your procedure, take your Aspirin and any morning medicines NOT listed above.  You may use sips of water.  5. Plan for one night stay--bring personal belongings. 6. Bring a current list of your medications and current insurance cards. 7. You MUST have a responsible person to drive you home. 8. Someone MUST be with you the first 24 hours after you arrive home or your discharge will be delayed. 9. Please wear clothes that are easy to get on and off and wear slip-on shoes.  Thank you for  allowing [place] to care for you!   -- [facility]  Your Pre-procedure COVID-19 Testing will be done on Saturday October 24th at 11:00 am at [facility] at [id number], [place], [place] [address]. Once you arrive at the testing site, stay in the right hand lane, go under the building overhang not the tent. If you  are tested under the tent your results may not be back before your procedure. Please be on time for your appointment.  After your swab you will be given a mask to wear and instructed to go home and quarantine/no visitors until after your procedure. If you test positive you will be notified and your procedure will be cancelled.

## 2019-08-17 NOTE — Telephone Encounter (Signed)
New Message   Patient is calling because she was to receive some information in reference to her covid screening for tomorrow. Please call to discuss.

## 2019-08-18 ENCOUNTER — Other Ambulatory Visit (HOSPITAL_COMMUNITY)
Admission: RE | Admit: 2019-08-18 | Discharge: 2019-08-18 | Disposition: A | Discharge status: Home / Self Care | Payer: Federal, State, Local not specified - PPO | Source: Ambulatory Visit | Attending: Interventional Cardiology | Admitting: Interventional Cardiology

## 2019-08-18 DIAGNOSIS — Z20828 Contact with and (suspected) exposure to other viral communicable diseases: Secondary | ICD-10-CM | POA: Insufficient documentation

## 2019-08-18 DIAGNOSIS — Z01812 Encounter for preprocedural laboratory examination: Secondary | ICD-10-CM | POA: Diagnosis not present

## 2019-08-19 LAB — NOVEL CORONAVIRUS, NAA (HOSP ORDER, SEND-OUT TO REF LAB; TAT 18-24 HRS): SARS-CoV-2, NAA: NOT DETECTED

## 2019-08-20 NOTE — Telephone Encounter (Signed)
Pt aware husband may come into the Short Stay waiting area while pt  is having a cath ./cy

## 2019-08-20 NOTE — Telephone Encounter (Signed)
New message   Patient wants to know if her husband can come with her to the hospital during her cath procedure. Please call the patient to discuss.

## 2019-08-21 ENCOUNTER — Telehealth: Payer: Self-pay | Admitting: *Deleted

## 2019-08-21 NOTE — Telephone Encounter (Signed)
Pt contacted pre-catheterization scheduled at [facility] for: Wednesday August 22, 2019 10 AM Verified arrival time and place: [facility]) at: 8 AM   No solid food after midnight prior to cath, clear liquids until 5 AM day of procedure. Contrast allergy:no  AM meds can be  taken pre-cath with sip of water including: ASA 81 mg   Confirmed patient has responsible adult to drive home post procedure and observe 24 hours after arriving home: yes  Currently, due to Covid-19 pandemic, only one support person will be allowed with patient. Must be the same support person for that patient's entire stay, will be screened and required to wear a mask. They will be asked to wait in the waiting room for the duration of the patient's stay.  Patients are required to wear a mask when they enter the hospital.      COVID-19 Pre-Screening Questions:  . In the past 7 to 10 days have you had a cough,  shortness of breath, headache, congestion, fever (100 or greater) body aches, chills, sore throat, or sudden loss of taste or sense of smell? no . Have you been around anyone with known Covid 19? no . Have you been around anyone who is awaiting Covid 19 test results in the past 7 to 10 days? no . Have you been around anyone who has been exposed to Covid 19, or has mentioned symptoms of Covid 19 within the past 7 to 10 days? no   I reviewed procedure/mask/visitor instructions, Covid-19 screening questions with patient, she verbalized understanding, thanked me for call.

## 2019-08-22 ENCOUNTER — Ambulatory Visit (HOSPITAL_COMMUNITY)
Admission: RE | Admit: 2019-08-22 | Discharge: 2019-08-22 | Disposition: A | Discharge status: Home / Self Care | Payer: Federal, State, Local not specified - PPO | Attending: Interventional Cardiology | Admitting: Interventional Cardiology

## 2019-08-22 ENCOUNTER — Encounter (HOSPITAL_COMMUNITY)
Admission: RE | Disposition: A | Discharge status: Home / Self Care | Payer: Self-pay | Source: Home / Self Care | Attending: Interventional Cardiology

## 2019-08-22 ENCOUNTER — Encounter (HOSPITAL_COMMUNITY): Payer: Self-pay | Admitting: Interventional Cardiology

## 2019-08-22 ENCOUNTER — Other Ambulatory Visit: Payer: Self-pay

## 2019-08-22 DIAGNOSIS — I10 Essential (primary) hypertension: Secondary | ICD-10-CM | POA: Diagnosis not present

## 2019-08-22 DIAGNOSIS — Z885 Allergy status to narcotic agent status: Secondary | ICD-10-CM | POA: Insufficient documentation

## 2019-08-22 DIAGNOSIS — Z87891 Personal history of nicotine dependence: Secondary | ICD-10-CM | POA: Insufficient documentation

## 2019-08-22 DIAGNOSIS — Z8249 Family history of ischemic heart disease and other diseases of the circulatory system: Secondary | ICD-10-CM | POA: Diagnosis not present

## 2019-08-22 DIAGNOSIS — R911 Solitary pulmonary nodule: Secondary | ICD-10-CM | POA: Diagnosis not present

## 2019-08-22 DIAGNOSIS — I350 Nonrheumatic aortic (valve) stenosis: Secondary | ICD-10-CM | POA: Diagnosis not present

## 2019-08-22 DIAGNOSIS — Z888 Allergy status to other drugs, medicaments and biological substances status: Secondary | ICD-10-CM | POA: Insufficient documentation

## 2019-08-22 DIAGNOSIS — R0789 Other chest pain: Secondary | ICD-10-CM | POA: Diagnosis present

## 2019-08-22 DIAGNOSIS — R011 Cardiac murmur, unspecified: Secondary | ICD-10-CM | POA: Diagnosis present

## 2019-08-22 DIAGNOSIS — Z79899 Other long term (current) drug therapy: Secondary | ICD-10-CM | POA: Insufficient documentation

## 2019-08-22 DIAGNOSIS — R002 Palpitations: Secondary | ICD-10-CM | POA: Diagnosis present

## 2019-08-22 HISTORY — PX: RIGHT/LEFT HEART CATH AND CORONARY ANGIOGRAPHY: CATH118266

## 2019-08-22 LAB — POCT I-STAT EG7
Acid-Base Excess: 2 mmol/L (ref 0.0–2.0)
Acid-Base Excess: 4 mmol/L — ABNORMAL HIGH (ref 0.0–2.0)
Acid-Base Excess: 5 mmol/L — ABNORMAL HIGH (ref 0.0–2.0)
Bicarbonate: 28 mmol/L (ref 20.0–28.0)
Bicarbonate: 30 mmol/L — ABNORMAL HIGH (ref 20.0–28.0)
Bicarbonate: 31.5 mmol/L — ABNORMAL HIGH (ref 20.0–28.0)
Calcium, Ion: 1.19 mmol/L (ref 1.15–1.40)
Calcium, Ion: 1.31 mmol/L (ref 1.15–1.40)
Calcium, Ion: 1.28 mmol/L (ref 1.15–1.40)
HCT: 37 % (ref 36.0–46.0)
HCT: 40 % (ref 36.0–46.0)
HCT: 38 % (ref 36.0–46.0)
Hemoglobin: 12.6 g/dL (ref 12.0–15.0)
Hemoglobin: 13.6 g/dL (ref 12.0–15.0)
Hemoglobin: 12.9 g/dL (ref 12.0–15.0)
O2 Saturation: 80 %
O2 Saturation: 84 %
O2 Saturation: 84 %
Potassium: 3.4 mmol/L — ABNORMAL LOW (ref 3.5–5.1)
Potassium: 3.6 mmol/L (ref 3.5–5.1)
Potassium: 3.6 mmol/L (ref 3.5–5.1)
Sodium: 140 mmol/L (ref 135–145)
Sodium: 141 mmol/L (ref 135–145)
Sodium: 140 mmol/L (ref 135–145)
TCO2: 29 mmol/L (ref 22–32)
TCO2: 32 mmol/L (ref 22–32)
TCO2: 33 mmol/L — ABNORMAL HIGH (ref 22–32)
pCO2, Ven: 48.8 mmHg (ref 44.0–60.0)
pCO2, Ven: 52.1 mmHg (ref 44.0–60.0)
pCO2, Ven: 54.8 mmHg (ref 44.0–60.0)
pH, Ven: 7.367 (ref 7.250–7.430)
pH, Ven: 7.369 (ref 7.250–7.430)
pH, Ven: 7.368 (ref 7.250–7.430)
pO2, Ven: 47 mmHg — ABNORMAL HIGH (ref 32.0–45.0)
pO2, Ven: 51 mmHg — ABNORMAL HIGH (ref 32.0–45.0)
pO2, Ven: 51 mmHg — ABNORMAL HIGH (ref 32.0–45.0)

## 2019-08-22 LAB — POCT I-STAT 7, (LYTES, BLD GAS, ICA,H+H)
Acid-Base Excess: 4 mmol/L — ABNORMAL HIGH (ref 0.0–2.0)
Bicarbonate: 29.9 mmol/L — ABNORMAL HIGH (ref 20.0–28.0)
Calcium, Ion: 1.25 mmol/L (ref 1.15–1.40)
HCT: 38 % (ref 36.0–46.0)
Hemoglobin: 12.9 g/dL (ref 12.0–15.0)
O2 Saturation: 99 %
Potassium: 3.5 mmol/L (ref 3.5–5.1)
Sodium: 140 mmol/L (ref 135–145)
TCO2: 31 mmol/L (ref 22–32)
pCO2 arterial: 48.8 mmHg — ABNORMAL HIGH (ref 32.0–48.0)
pH, Arterial: 7.395 (ref 7.350–7.450)
pO2, Arterial: 160 mmHg — ABNORMAL HIGH (ref 83.0–108.0)

## 2019-08-22 SURGERY — RIGHT/LEFT HEART CATH AND CORONARY ANGIOGRAPHY
Anesthesia: LOCAL

## 2019-08-22 MED ORDER — VERAPAMIL HCL 2.5 MG/ML IV SOLN
INTRAVENOUS | Status: AC
  Filled 2019-08-22: qty 2

## 2019-08-22 MED ORDER — ACETAMINOPHEN 325 MG PO TABS: 650.0000 mg | ORAL_TABLET | ORAL | Status: DC | PRN

## 2019-08-22 MED ORDER — MIDAZOLAM HCL 2 MG/2ML IJ SOLN
INTRAMUSCULAR | Status: AC
  Filled 2019-08-22: qty 2

## 2019-08-22 MED ORDER — SODIUM CHLORIDE 0.9 % IV SOLN: 250.0000 mL | INTRAVENOUS | Status: DC | PRN

## 2019-08-22 MED ORDER — HEPARIN (PORCINE) IN NACL 1000-0.9 UT/500ML-% IV SOLN
INTRAVENOUS | Status: DC | PRN
  Administered 2019-08-22 (×2): 500 mL

## 2019-08-22 MED ORDER — FENTANYL CITRATE (PF) 100 MCG/2ML IJ SOLN
INTRAMUSCULAR | Status: AC
  Filled 2019-08-22: qty 2

## 2019-08-22 MED ORDER — FENTANYL CITRATE (PF) 100 MCG/2ML IJ SOLN
INTRAMUSCULAR | Status: DC | PRN
  Administered 2019-08-22: 25 ug via INTRAVENOUS

## 2019-08-22 MED ORDER — LABETALOL HCL 5 MG/ML IV SOLN: 10.0000 mg | INTRAVENOUS | Status: DC | PRN

## 2019-08-22 MED ORDER — ASPIRIN 81 MG PO CHEW: 81.0000 mg | CHEWABLE_TABLET | ORAL | Status: DC

## 2019-08-22 MED ORDER — LIDOCAINE HCL (PF) 1 % IJ SOLN
INTRAMUSCULAR | Status: AC
  Filled 2019-08-22: qty 30

## 2019-08-22 MED ORDER — MIDAZOLAM HCL 2 MG/2ML IJ SOLN
INTRAMUSCULAR | Status: DC | PRN
  Administered 2019-08-22 (×2): 1 mg via INTRAVENOUS

## 2019-08-22 MED ORDER — VERAPAMIL HCL 2.5 MG/ML IV SOLN
INTRAVENOUS | Status: DC | PRN
  Administered 2019-08-22: 10 mL via INTRA_ARTERIAL

## 2019-08-22 MED ORDER — HEPARIN SODIUM (PORCINE) 1000 UNIT/ML IJ SOLN
INTRAMUSCULAR | Status: DC | PRN
  Administered 2019-08-22: 3500 [IU] via INTRAVENOUS

## 2019-08-22 MED ORDER — HYDRALAZINE HCL 20 MG/ML IJ SOLN: 10.0000 mg | INTRAMUSCULAR | Status: DC | PRN

## 2019-08-22 MED ORDER — SODIUM CHLORIDE 0.9% FLUSH: 3.0000 mL | Freq: Two times a day (BID) | INTRAVENOUS | Status: DC

## 2019-08-22 MED ORDER — ONDANSETRON HCL 4 MG/2ML IJ SOLN: 4.0000 mg | Freq: Four times a day (QID) | INTRAMUSCULAR | Status: DC | PRN

## 2019-08-22 MED ORDER — LIDOCAINE HCL (PF) 1 % IJ SOLN
INTRAMUSCULAR | Status: DC | PRN
  Administered 2019-08-22 (×2): 2 mL

## 2019-08-22 MED ORDER — HEPARIN (PORCINE) IN NACL 1000-0.9 UT/500ML-% IV SOLN
INTRAVENOUS | Status: AC
  Filled 2019-08-22: qty 1000

## 2019-08-22 MED ORDER — SODIUM CHLORIDE 0.9% FLUSH: 3.0000 mL | INTRAVENOUS | Status: DC | PRN

## 2019-08-22 MED ORDER — SODIUM CHLORIDE 0.9 % IV SOLN: INTRAVENOUS | Status: DC

## 2019-08-22 MED ORDER — SODIUM CHLORIDE 0.9 % WEIGHT BASED INFUSION
3.0000 mL/kg/h | INTRAVENOUS | Status: AC
  Administered 2019-08-22: 3 mL/kg/h via INTRAVENOUS

## 2019-08-22 MED ORDER — SODIUM CHLORIDE 0.9 % WEIGHT BASED INFUSION: 1.0000 mL/kg/h | INTRAVENOUS | Status: DC

## 2019-08-22 MED ORDER — HEPARIN SODIUM (PORCINE) 1000 UNIT/ML IJ SOLN
INTRAMUSCULAR | Status: AC
  Filled 2019-08-22: qty 1

## 2019-08-22 MED ORDER — IOHEXOL 350 MG/ML SOLN
INTRAVENOUS | Status: DC | PRN
  Administered 2019-08-22: 35 mL

## 2019-08-22 SURGICAL SUPPLY — 12 items
CATH 5FR JL3.5 JR4 ANG PIG MP (CATHETERS) ×1 IMPLANT
CATH BALLN WEDGE 5F 110CM (CATHETERS) ×1 IMPLANT
DEVICE RAD COMP TR BAND LRG (VASCULAR PRODUCTS) ×1 IMPLANT
GLIDESHEATH SLEND A-KIT 6F 22G (SHEATH) ×1 IMPLANT
GUIDEWIRE INQWIRE 1.5J.035X260 (WIRE) IMPLANT
INQWIRE 1.5J .035X260CM (WIRE) ×2
KIT HEART LEFT (KITS) ×2 IMPLANT
PACK CARDIAC CATHETERIZATION (CUSTOM PROCEDURE TRAY) ×2 IMPLANT
SHEATH GLIDE SLENDER 4/5FR (SHEATH) ×1 IMPLANT
TRANSDUCER W/STOPCOCK (MISCELLANEOUS) ×2 IMPLANT
TUBING CIL FLEX 10 FLL-RA (TUBING) ×2 IMPLANT
WIRE EMERALD ST .035X150CM (WIRE) ×1 IMPLANT

## 2019-08-22 NOTE — Progress Notes (Signed)
Paged Dr. Tamala JulianErasmo Downer returned page and confirmed that Dr. Tamala Julian told patient she could return to work on Tuesday 08/28/19.

## 2019-08-22 NOTE — CV Procedure (Addendum)
   Right and left heart catheterization via right antecubital vein and radial.  Radial access achieved with real-time vascular ultrasound.  Normal coronary arteries.  Normal LV and pulmonary hemodynamics.  Previously documented normal LV function.  Peak to peak aortic valve gradient 52 mmHg with mean gradient 37.2 mmHg.  Calculated aortic valve area 1.09 cm.  Cardiac output during the procedure 6.8 L/min.

## 2019-08-22 NOTE — Interval H&P Note (Signed)
Cath Lab Visit (complete for each Cath Lab visit)  Clinical Evaluation Leading to the Procedure:   ACS: No.  Non-ACS:    Anginal Classification: CCS [name]  Anti-ischemic medical therapy: Minimal Therapy (1 class of medications)  Non-Invasive Test Results: No non-invasive testing performed  Prior CABG: No previous CABG      History and Physical Interval Note:  08/22/2019 8:22 AM  [name]  has presented today for surgery, with the diagnosis of as.  The various methods of treatment have been discussed with the patient and family. After consideration of risks, benefits and other options for treatment, the patient has consented to  Procedure(s): RIGHT/LEFT HEART CATH AND CORONARY ANGIOGRAPHY (N/A) as a surgical intervention.  The patient's history has been reviewed, patient examined, no change in status, stable for surgery.  I have reviewed the patient's chart and labs.  Questions were answered to the patient's satisfaction.     [name]

## 2019-08-22 NOTE — Discharge Instructions (Signed)
Moderate Conscious Sedation, Adult, Care After °These instructions provide you with information about caring for yourself after your procedure. Your health care provider may also give you more specific instructions. Your treatment has been planned according to current medical practices, but problems sometimes occur. Call your health care provider if you have any problems or questions after your procedure. °What can I expect after the procedure? °After your procedure, it is common: °· To feel sleepy for several hours. °· To feel clumsy and have poor balance for several hours. °· To have poor judgment for several hours. °· To vomit if you eat too soon. °Follow these instructions at home: °For at least 24 hours after the procedure: ° °· Do not: °? Participate in activities where you could fall or become injured. °? Drive. °? Use heavy machinery. °? Drink alcohol. °? Take sleeping pills or medicines that cause drowsiness. °? Make important decisions or sign legal documents. °? Take care of children on your own. °· Rest. °Eating and drinking °· Follow the diet recommended by your health care provider. °· If you vomit: °? Drink water, juice, or soup when you can drink without vomiting. °? Make sure you have little or no nausea before eating solid foods. °General instructions °· Have a responsible adult stay with you until you are awake and alert. °· Take over-the-counter and prescription medicines only as told by your health care provider. °· If you smoke, do not smoke without supervision. °· Keep all follow-up visits as told by your health care provider. This is important. °Contact a health care provider if: °· You keep feeling nauseous or you keep vomiting. °· You feel light-headed. °· You develop a rash. °· You have a fever. °Get help right away if: °· You have trouble breathing. °This information is not intended to replace advice given to you by your health care provider. Make sure you discuss any questions you have  with your health care provider. °Document Released: 08/01/2013 Document Revised: 09/23/2017 Document Reviewed: 01/31/2016 °Elsevier Patient Education © 2020 [employer] ° °KEEP RIGHT ARM ELEVATED ABOVE LEVEL OF HEART °Radial Site Care ° ° °This sheet gives you information about how to care for yourself after your procedure. Your health care provider may also give you more specific instructions. If you have problems or questions, contact your health care provider. °What can I expect after the procedure? °After the procedure, it is common to have: °· Bruising and tenderness at the catheter insertion area. °Follow these instructions at home: °Medicines °· Take over-the-counter and prescription medicines only as told by your health care provider. °Insertion site care °· Follow instructions from your health care provider about how to take care of your insertion site. Make sure you: °? Wash your hands with soap and water before you change your bandage (dressing). If soap and water are not available, use hand sanitizer. °? Change your dressing as told by your health care provider. °? Leave stitches (sutures), skin glue, or adhesive strips in place. These skin closures may need to stay in place for 2 weeks or longer. If adhesive strip edges start to loosen and curl up, you may trim the loose edges. Do not remove adhesive strips completely unless your health care provider tells you to do that. °· Check your insertion site every day for signs of infection. Check for: °? Redness, swelling, or pain. °? Fluid or blood. °? Pus or a bad smell. °? Warmth. °· Do not take baths, swim, or use a hot   tub until your health care provider approves. °· You may shower 24-48 hours after the procedure, or as directed by your health care provider. °? Remove the dressing and gently wash the site with plain soap and water. °? Pat the area dry with a clean towel. °? Do not rub the site. That could cause bleeding. °· Do not apply powder or lotion  to the site. °Activity ° °· For 24 hours after the procedure, or as directed by your health care provider: °? Do not flex or bend the affected arm. °? Do not push or pull heavy objects with the affected arm. °? Do not drive yourself home from the hospital or clinic. You may drive 24 hours after the procedure unless your health care provider tells you not to. °? Do not operate machinery or power tools. °· Do not lift anything that is heavier than 10 lb (4.5 kg), or the limit that you are told, until your health care provider says that it is safe. °· Ask your health care provider when it is okay to: °? Return to work or school. °? Resume usual physical activities or sports. °? Resume sexual activity. °General instructions °· If the catheter site starts to bleed, raise your arm and put firm pressure on the site. If the bleeding does not stop, get help right away. This is a medical emergency. °· If you went home on the same day as your procedure, a responsible adult should be with you for the first 24 hours after you arrive home. °· Keep all follow-up visits as told by your health care provider. This is important. °Contact a health care provider if: °· You have a fever. °· You have redness, swelling, or yellow drainage around your insertion site. °Get help right away if: °· You have unusual pain at the radial site. °· The catheter insertion area swells very fast. °· The insertion area is bleeding, and the bleeding does not stop when you hold steady pressure on the area. °· Your arm or hand becomes pale, cool, tingly, or numb. °These symptoms may represent a serious problem that is an emergency. Do not wait to see if the symptoms will go away. Get medical help right away. Call your local emergency services (911 in the [place] Do not drive yourself to the hospital. °Summary °· After the procedure, it is common to have bruising and tenderness at the site. °· Follow instructions from your health care provider about how to  take care of your radial site wound. Check the wound every day for signs of infection. °· Do not lift anything that is heavier than 10 lb (4.5 kg), or the limit that you are told, until your health care provider says that it is safe. °This information is not intended to replace advice given to you by your health care provider. Make sure you discuss any questions you have with your health care provider. °Document Released: 11/13/2010 Document Revised: 11/16/2017 Document Reviewed: 11/16/2017 °Elsevier Patient Education © 2020 Elsevier Inc. ° °

## 2019-08-22 NOTE — Research (Signed)
PHDEV Informed Consent   Subject Name: [name]  Subject met inclusion and exclusion criteria.  The informed consent form, study requirements and expectations were reviewed with the subject and questions and concerns were addressed prior to the signing of the consent form.  The subject verbalized understanding of the trail requirements.  The subject agreed to participate in the [facility] trial and signed the informed consent.  The informed consent was obtained prior to performance of any protocol-specific procedures for the subject.  A copy of the signed informed consent was given to the subject and a copy was placed in the subject's medical record.  Carys Malina W 08/22/2019, [id number]

## 2019-08-29 ENCOUNTER — Institutional Professional Consult (permissible substitution): Payer: Federal, State, Local not specified - PPO | Admitting: Surgery

## 2019-08-29 ENCOUNTER — Encounter: Payer: Self-pay | Admitting: Surgery

## 2019-08-29 ENCOUNTER — Other Ambulatory Visit: Payer: Self-pay

## 2019-08-29 VITALS — BP 138/83 | HR 108 | Temp 98.1°F | Resp 16 | Ht 66.0 in | Wt 145.0 lb

## 2019-08-29 DIAGNOSIS — I350 Nonrheumatic aortic (valve) stenosis: Secondary | ICD-10-CM | POA: Diagnosis not present

## 2019-08-30 ENCOUNTER — Encounter: Payer: Self-pay | Admitting: Surgery

## 2019-08-30 NOTE — Progress Notes (Signed)
Patient ID: [name], female   DOB: 12/19/1955, 63 y.o.   MRN: [id number]  [facility] REPORT  Referring Provider is Josue Hector, MD Primary Cardiologist is Jenkins Rouge, MD PCP is Leighton Ruff, MD  Chief Complaint  Patient presents with   Aortic Stenosis    eval for SAVR vs.TAVR.Marland KitchenMarland KitchenCTA C/A/P 04/16/19, CATH 08/22/19, ECHO 08/14/19    HPI:  The patient is a 63 year old woman with a history of hypertension and aortic stenosis that has been followed by periodic echocardiogram.  Her first echocardiogram documented in [facility] showed a mean gradient of 17 mmHg across a trileaflet aortic valve.  She had a mean gradient of 28 mmHg measured in March 2018.  Then in September 2019 the mean gradient had increased to 37 mmHg with a peak gradient of 69 mmHg and a valve area of 0.8 cm.  She had a follow-up echocardiogram in March 2020 and the mean gradient was stable at 36 mmHg.  Her most recent follow-up echocardiogram on 08/14/2019 showed an increase in the mean gradient to 77 mmHg with a peak gradient of 109 mmHg and an aortic valve area of 0.76 cm.  It was felt that the significant increase in the gradient compared to her previous echo was due to the gradient being measured from the right parasternal view on her most recent echo but she did not a have good right parasternal view on her prior echo.  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  She estimates that she walks about 10 or 11 miles per day.  She denies any symptoms.  She has had no shortness of breath or fatigue.  She denies any chest pain or pressure.  She denies dizziness.  She has had no peripheral edema.  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  She is married and lives with her husband and cares for 2 grandchildren.   Past Medical History:  Diagnosis Date   Chronic  kidney disease    Diverticulitis, colon    GERD (gastroesophageal reflux disease)    Hypertension    Renal cancer ([place]) 2007   Surgically removed from right kidney    Past Surgical History:  Procedure Laterality Date   APPENDECTOMY     HERNIA REPAIR     OVARIAN CYST SURGERY     RIGHT/LEFT HEART CATH AND CORONARY ANGIOGRAPHY N/A 08/22/2019   Procedure: RIGHT/LEFT HEART CATH AND CORONARY ANGIOGRAPHY;  Surgeon: Belva Crome, MD;  Location: [facility];  Service: Cardiovascular;  Laterality: N/A;    Family History  Problem Relation Age of Onset   Cancer Mother        Lung   Cancer Father        Lung   Hypertension Father    Cancer Sister        Lung   Heart attack Paternal Grandfather    Heart attack Paternal Aunt     Social History   Socioeconomic History   Marital status: Married    Spouse name: Not on file   Number of children: Not on file   Years of education: Not on file   Highest education level: Not on file  Occupational History   Not on file  Social Needs   Financial resource strain: Not on file   Food insecurity    Worry: Not on file  Inability: Not on file   Transportation needs    Medical: Not on file    Non-medical: Not on file  Tobacco Use   Smoking status: Former Smoker    Packs/day: 1.00    Years: 10.00    Pack years: 10.00    Types: Cigarettes    Quit date: 10/25/2005    Years since quitting: 13.8   Smokeless tobacco: Never Used   Tobacco comment: quit around 2007   Substance and Sexual Activity   Alcohol use: Yes    Comment: occ   Drug use: No   Sexual activity: Not on file  Lifestyle   Physical activity    Days per week: Not on file    Minutes per session: Not on file   Stress: Not on file  Relationships   Social connections    Talks on phone: Not on file    Gets together: Not on file    Attends religious service: Not on file    Active member of club or organization: Not on file    Attends  meetings of clubs or organizations: Not on file    Relationship status: Not on file   Intimate partner violence    Fear of current or ex partner: Not on file    Emotionally abused: Not on file    Physically abused: Not on file    Forced sexual activity: Not on file  Other Topics Concern   Not on file  Social History Narrative   Not on file    Current Outpatient Medications  Medication Sig Dispense Refill   amLODipine (NORVASC) 5 MG tablet Take 1 tablet (5 mg total) by mouth daily. 90 tablet 3   fluticasone (FLONASE) 50 MCG/ACT nasal spray Place 2 sprays into both nostrils daily as needed for allergies.   1   Lidocaine 4 % PTCH Apply 1 patch topically daily as needed (pain).     naproxen sodium (ALEVE) 220 MG tablet Take 440 mg by mouth daily as needed (pain).     omeprazole (PRILOSEC) 40 MG capsule Take 40 mg by mouth daily as needed (heartburn).     Tetrahydrozoline HCl (VISINE OP) Place 1 drop into both eyes daily as needed (itching / burning).     No current facility-administered medications for this visit.     Allergies  Allergen Reactions   Cyprodenate Itching   Diphenhydramine Hcl     loopy   Codeine Itching   Morphine And Related Nausea Only    Anxiety and Nausea       Review of Systems:   General:  normal appetite, normal energy, no weight gain, no weight loss, no fever  Cardiac:  no chest pain with exertion, no chest pain at rest, noSOB with no exertion, no resting SOB, no PND, no orthopnea, no palpitations, no arrhythmia, no atrial fibrillation, no LE edema, no dizzy spells, no syncope  Respiratory:  no shortness of breath, no home oxygen, no productive cough, no dry cough, no bronchitis, no wheezing, no hemoptysis, no asthma, no pain with inspiration or cough, no sleep apnea, no CPAP at night  GI:   no difficulty swallowing, no reflux, no frequent heartburn, no hiatal hernia, no abdominal pain, no constipation, no diarrhea, no hematochezia, no  hematemesis, no melena  GU:   no dysuria,  no frequency, no urinary tract infection, no hematuria,  no kidney stones, no kidney disease  Vascular:  no pain suggestive of claudication, no pain in feet, no leg cramps,  no varicose veins, no DVT, no non-healing foot ulcer  Neuro:   no stroke, no TIA's, no seizures, no headaches, no temporary blindness one eye,  no slurred speech, no peripheral neuropathy, no chronic pain, no instability of gait, no memory/cognitive dysfunction  Musculoskeletal: no arthritis, no joint swelling, no myalgias, no difficulty walking, normal mobility   Skin:   no rash, no itching, no skin infections, no pressure sores or ulcerations  Psych:   no anxiety, no depression, no nervousness, no unusual recent stress  Eyes:   no blurry vision, no floaters, no recent vision changes, does not wear glasses or contacts  ENT:   no hearing loss, no loose or painful teeth, no dentures, last saw dentist 2019  Hematologic:  no easy bruising, no abnormal bleeding, no clotting disorder, no frequent epistaxis  Endocrine:  no diabetes, does not check CBG's at home           Physical Exam:   BP 138/83 (BP Location: Right Arm, Patient Position: Sitting, Cuff Size: Normal)    Pulse (!) 108    Temp 98.1 F (36.7 C)    Resp 16    Ht 5\' 6"  (1.676 m)    Wt 145 lb (65.8 kg)    SpO2 97% Comment: RA   BMI 23.40 kg/m   General:  Fit and  well-appearing  HEENT:  Unremarkable, NCAT, PERLA, EOMI, teeth in good condition  Neck:   no JVD, no bruits, no adenopathy or thyromegaly  Chest:   clear to auscultation, symmetrical breath sounds, no wheezes, no rhonchi   CV:   RRR, grade III/VI crescendo/decrescendo murmur heard best at RSB,  no diastolic murmur  Abdomen:  soft, non-tender, no masses   Extremities:  warm, well-perfused, pulses palpable in feet, no LE edema  Rectal/GU  Deferred  Neuro:   Grossly non-focal and symmetrical throughout  Skin:   Clean and dry, no rashes, no  breakdown   Diagnostic Tests:   Patient Name:   [name] Date of Exam: 08/14/2019 Medical Rec #:  [id number]        Height:       66.0 in Accession #:    [id number]       Weight:       154.0 lb Date of Birth:  [birth year]        BSA:          1.79 m Patient Age:    43 years         BP:           136/74 mmHg Patient Gender: F                HR:           92 bpm. 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   History:        Patient has prior history of Echocardiogram examinations, most                 recent 12/27/2018. History of kidney Cancer                 Signs/Symptoms:Palpitations Risk Factors:Former Smoker. Aorta  4.0 cm by CT 6/20.   Sonographer:    Wilford Sports Rodgers-Jones RDCS Referring Phys: [place]    1. The aortic valve is abnormal Aortic valve regurgitation is moderate by color flow Doppler. Severe aortic valve stenosis. Vmax 5.2 m/s, MG 77 mmHg, AVA 0.76 cm2, DI 0.23. Compared to prior TTE 12/27/18, there is a significant increase in AV gradients,  though max gradients were obtained from right parasternal view and did not have a good right parasternal view on prior echo  2. Left ventricular ejection fraction, by visual estimation, is 60 to 65%. The left ventricle has normal function. Normal left ventricular size. There is mildly increased left ventricular hypertrophy.  3. Elevated mean left atrial pressure.  4. Left ventricular diastolic Doppler parameters are consistent with pseudonormalization pattern of LV diastolic filling.  5. Global right ventricle has normal systolic function.The right ventricular size is normal. No increase in right ventricular wall thickness.  6. Left atrial size was mildly dilated.  7. Right atrial size was  normal.  8. Trivial pericardial effusion is present.  9. The mitral valve is normal in structure. Trace mitral valve regurgitation. 10. The tricuspid valve is normal in structure. Tricuspid valve regurgitation was not visualized by color flow Doppler. 11. The pulmonic valve was not well visualized. Pulmonic valve regurgitation is not visualized by color flow Doppler. 12. There is mild dilatation of the ascending aorta measuring 39 mm. 13. The inferior vena cava is normal in size with greater than 50% respiratory variability, suggesting right atrial pressure of 3 mmHg.  FINDINGS  Left Ventricle: Left ventricular ejection fraction, by visual estimation, is 60 to 65%. The left ventricle has normal function. There is mildly increased left ventricular hypertrophy. Concentric left ventricular hypertrophy. Normal left ventricular  size. Spectral Doppler shows Left ventricular diastolic Doppler parameters are consistent with pseudonormalization pattern of LV diastolic filling. Elevated mean left atrial pressure.  Right Ventricle: The right ventricular size is normal. No increase in right ventricular wall thickness. Global RV systolic function is has normal systolic function.  Left Atrium: Left atrial size was mildly dilated.  Right Atrium: Right atrial size was normal in size  Pericardium: Trivial pericardial effusion is present.  Mitral Valve: The mitral valve is normal in structure. Trace mitral valve regurgitation.  Tricuspid Valve: The tricuspid valve is normal in structure. Tricuspid valve regurgitation was not visualized by color flow Doppler.  Aortic Valve: The aortic valve is abnormal. Aortic valve regurgitation is moderate by color flow Doppler. Aortic regurgitation PHT measures 297 msec. Severe aortic stenosis is present. Aortic valve mean gradient measures 77.0 mmHg. Aortic valve peak  gradient measures 108.6 mmHg. Aortic valve area, by VTI measures 0.76 cm. Valve is present in  the aortic position. Procedure Date: 04/16/19. The peak aortic velocity was obtained from the right parasternal view.  Pulmonic Valve: The pulmonic valve was not well visualized. Pulmonic valve regurgitation is not visualized by color flow Doppler.  Aorta: Aortic dilatation noted. There is mild dilatation of the ascending aorta measuring 39 mm.  Venous: The inferior vena cava is normal in size with greater than 50% respiratory variability, suggesting right atrial pressure of 3 mmHg.  IAS/Shunts: The atrial septum is grossly normal.     LEFT VENTRICLE PLAX 2D LVIDd:         4.60 cm  Diastology LVIDs:         2.80 cm  LV e' lateral:   4.10 cm/s LV PW:  1.20 cm  LV E/e' lateral: 21.2 LV IVS:        1.00 cm  LV e' medial:    7.29 cm/s LVOT diam:     2.10 cm  LV E/e' medial:  11.9 LV SV:         68 ml LV SV Index:   37.38 LVOT Area:     3.46 cm    RIGHT VENTRICLE RV Basal diam:  3.80 cm RV S prime:     20.70 cm/s TAPSE (M-mode): 2.2 cm  LEFT ATRIUM             Index       RIGHT ATRIUM           Index LA diam:        4.70 cm 2.63 cm/m  RA Area:     13.90 cm LA Vol (A2C):   64.4 ml 35.99 ml/m RA Volume:   40.40 ml  22.58 ml/m LA Vol (A4C):   67.4 ml 37.66 ml/m LA Biplane Vol: 65.8 ml 36.77 ml/m  AORTIC VALVE AV Area (Vmax):    0.83 cm AV Area (Vmean):   0.94 cm AV Area (VTI):     0.76 cm AV Vmax:           521.00 cm/s AV Vmean:          352.200 cm/s AV VTI:            1.240 m AV Peak Grad:      108.6 mmHg AV Mean Grad:      77.0 mmHg LVOT Vmax:         125.50 cm/s LVOT Vmean:        95.550 cm/s LVOT VTI:          0.271 m LVOT/AV VTI ratio: 0.22 AI PHT:            297 msec   AORTA Ao Root diam: 3.10 cm Ao Asc diam:  3.90 cm  MV E velocity: 86.90 cm/s  103 cm/s MV A velocity: 119.00 cm/s 70.3 cm/s SHUNTS MV E/A ratio:  0.73        1.5       Systemic VTI:  0.27 m                                      Systemic Diam: 2.10 cm    Oswaldo Milian MD Electronically signed by Oswaldo Milian MD Signature Date/Time: 08/14/2019/3:58:02 PM   Physicians  Panel Physicians Referring Physician Case Authorizing Physician  Belva Crome, MD (Primary)    Procedures  RIGHT/LEFT HEART CATH AND CORONARY ANGIOGRAPHY  Conclusion   Moderate to severe aortic stenosis with calculated aortic valve area 1.07 cm, peak to peak gradient 52 mmHg, mean gradient 32 mmHg, and peak instantaneous gradient 56 mmHg.  Cardiac output 6.8 L/min.  Right dominant with normal coronary arteries.  Previously documented normal LV systolic function.  LVEDP 2 mmHg.  RECOMMENDATIONS:   Heart valve team evaluation and management of significant aortic stenosis.  Recommendations  Antiplatelet/Anticoag No indication for antiplatelet therapy at this time .  Surgeon Notes    08/22/2019 10:39 AM CV Procedure addendum by Belva Crome, MD  Indications  Nonrheumatic aortic valve stenosis [I35.0 (ICD-10-CM)]  Procedural Details  Technical Details The right radial area was sterilely prepped and draped. Intravenous sedation with Versed and fentanyl was administered. 1% Xylocaine  was infiltrated to achieve local analgesia. Using real-time vascular ultrasound, a double wall stick with an angiocath was utilized to obtain intra-arterial access. A VUS image was saved for the permanent record.The modified Seldinger technique was used to place a 70F " Slender" sheath in the right radial artery. Weight based heparin was administered. Coronary angiography was done using 5 F catheters. Right coronary angiography was performed with a JR4. Left ventricular hemodymic recordings and pullback across aortic valve was performed using the Judkins right catheter.  The valve was crossed using a straight 0.036 wire.  Left ventriculography was not performed.  Left coronary angiography was performed with a JL 3.5 cm.  Right heart catheterization was performed by exchanging a  previously placed antecubital IV angio-cath for a 5 French Slender sheath. 1% Xylocaine was used to locally nesthetize the area around the IV site. The IV catheter was wired using an .018 guidewire. The modified Seldinger technique was used to place the 5 [place] sheath. Double glove technique was used to enhance sterility. After sheath insertion, right heart cath was performed using a 5 French balloon tipped catheter and fluoroscopic guidance. Pressures were recorded in each chamber and in the pulmonary capillary wedge position.. The main pulmonary artery O2 saturation was sampled.   Hemostasis was achieved using a pneumatic band.  During this procedure the patient is administered a total of Versed 2 mg and Fentanyl 25 mcg to achieve and maintain moderate conscious sedation.  The patient's heart rate, blood pressure, and oxygen saturation are monitored continuously during the procedure. The period of conscious sedation is 31 minutes, of which I was present face-to-face 100% of this time. Estimated blood loss <50 mL.   During this procedure medications were administered to achieve and maintain moderate conscious sedation while the patient's heart rate, blood pressure, and oxygen saturation were continuously monitored and I was present face-to-face 100% of this time.  Medications (Filter: Administrations occurring from 08/22/19 0941 to 08/22/19 1030) Medication Rate/Dose/Volume Action  Date Time   Heparin (Porcine) in NaCl 1000-0.9 UT/500ML-% SOLN (mL) 500 mL Given 08/22/19 0944   Total dose as of 08/22/19 1030 500 mL Given 0944   1,000 mL        fentaNYL (SUBLIMAZE) injection (mcg) 25 mcg Given 08/22/19 0950   Total dose as of 08/22/19 1030        25 mcg        midazolam (VERSED) injection (mg) 1 mg Given 08/22/19 0950   Total dose as of 08/22/19 1030 1 mg Given 0956   2 mg        lidocaine (PF) (XYLOCAINE) 1 % injection (mL) 2 mL Given 08/22/19 0956   Total dose as of 08/22/19 1030 2 mL Given  1000   4 mL        Radial Cocktail/Verapamil only (mL) 10 mL Given 08/22/19 1001   Total dose as of 08/22/19 1030        10 mL        heparin injection (Units) 3,500 Units Given 08/22/19 1009   Total dose as of 08/22/19 1030        3,500 Units        iohexol (OMNIPAQUE) 350 MG/ML injection (mL) 35 mL Given 08/22/19 1024   Total dose as of 08/22/19 1030        35 mL        Sedation Time  Sedation Time Physician-1: 31 minutes 17 seconds  Contrast  Medication Name Total Dose  iohexol (  OMNIPAQUE) 350 MG/ML injection 35 mL    Radiation/Fluoro  Fluoro time: 5.1 (min) DAP: 5323 (mGycm2) Cumulative Air Kerma: 90 (mGy)  Coronary Findings  Diagnostic Dominance: Right No diagnostic findings have been documented. Intervention  No interventions have been documented. Right Heart  Right Heart Pressures Hemodynamic findings consistent with pulmonary hypertension. LV EDP is normal.  Left Heart  Left Ventricle LV end diastolic pressure is normal.  Aortic Valve There is severe aortic valve stenosis. The aortic valve is calcified. There is restricted aortic valve motion.  Coronary Diagrams  Diagnostic Dominance: Right  Intervention  Implants   No implant documentation for this case.  Syngo Images  Show images for CARDIAC CATHETERIZATION  Images on Long Term Storage  Show images for Geanette, Caress to Procedure Log  Procedure Log    Hemo Data   Most Recent Value  Fick Cardiac Output 6.81 L/min  Fick Cardiac Output Index 3.9 (L/min)/BSA  Aortic Mean Gradient 37.21 mmHg  Aortic Peak Gradient 52 mmHg  Aortic Valve Area 1.09  Aortic Value Area Index 0.62 cm2/BSA  RA A Wave 2 mmHg  RA V Wave 0 mmHg  RA Mean 0 mmHg  RV Systolic Pressure 19 mmHg  RV Diastolic Pressure 0 mmHg  RV EDP 0 mmHg  PA Systolic Pressure 16 mmHg  PA Diastolic Pressure 6 mmHg  PA Mean 11 mmHg  PW A Wave 5 mmHg  PW V Wave 5 mmHg  PW Mean 4 mmHg  AO Systolic Pressure [id number] mmHg  AO  Diastolic Pressure 59 mmHg  AO Mean 80 mmHg  LV Systolic Pressure 123456 mmHg  LV Diastolic Pressure 0 mmHg  LV EDP 2 mmHg  AOp Systolic Pressure 123XX123 mmHg  AOp Diastolic Pressure 59 mmHg  AOp Mean Pressure 81 mmHg  LVp Systolic Pressure 0000000 mmHg  LVp Diastolic Pressure 0 mmHg  LVp EDP Pressure 2 mmHg  QP/QS 1  TPVR Index 2.82 HRUI  TSVR Index 20.53 HRUI  TPVR/TSVR Ratio 0.14   ADDENDUM REPORT: 04/16/2019 14:15  CLINICAL DATA:  Aortic stenosis  EXAM: Cardiac TAVR CT  TECHNIQUE: The patient was scanned on a Siemens Force [id number] slice scanner. A 120 kV retrospective scan was triggered in the descending thoracic aorta at 111 HU's. Gantry rotation speed was 270 msecs and collimation was .9 mm. No beta blockade or nitro were given. The 3D data set was reconstructed in 5% intervals of the R-R cycle. Systolic and diastolic phases were analyzed on a dedicated work station using MPR, MIP and VRT modes. The patient received 80 cc of contrast.  FINDINGS: Aortic Valve: Tri leaflet calcified with restricted leaflet motion  Aorta: Aortic root dilatation mild calcific atherosclerosis Normal arch vessels  Sinotubular Junction: 29 mm  Ascending Thoracic Aorta: 40 mm  Aortic Arch: 26 mm  Descending Thoracic Aorta: 24 mm  Sinus of Valsalva Measurements:  Non-coronary: 32 mm  Right - coronary: 30 mm  Left - coronary: 31 mm  Coronary Artery Height above Annulus:  Left Main: 13.7 mm above annulus  Right Coronary: 14 mm above annulus  Virtual Basal Annulus Measurements:  Maximum/Minimum Diameter: 24.9 mm x 19.4 mm  Perimeter: 85 mm  Area: 379 mm2  Coronary Arteries: Sufficient height above annulus for deployment  Optimum Fluoroscopic Angle for Delivery: LAO 24 Cranial 18 degrees  IMPRESSION: 1. Tri- leaflet AV with annular area of 379 mm2 suitable for a 23 mm Sapien 3 valve  2. Essentially normal right dominant coronary arteries 1-24% CAD  RAD 1  LM stenosis  3.  Mild aortic root dilatation 4.0 cm  4.  Coronary arteries sufficient height above annulus for deployment  5. Optimum Angiographic angle for deployment LAO 24 Cranial 18 degrees  Jenkins Rouge   Electronically Signed   By: Jenkins Rouge M.D.   On: 04/16/2019 14:15      CLINICAL DATA:  63 year old female with history of aortic stenosis. Preprocedural study prior to potential transcatheter aortic valve replacement (TAVR) procedure.  EXAM: CT ANGIOGRAPHY CHEST, ABDOMEN AND PELVIS  TECHNIQUE: Multidetector CT imaging through the chest, abdomen and pelvis was performed using the standard protocol during bolus administration of intravenous contrast. Multiplanar reconstructed images and MIPs were obtained and reviewed to evaluate the vascular anatomy.  CONTRAST:  164mL OMNIPAQUE IOHEXOL 350 MG/ML SOLN  COMPARISON:  Chest CT 08/25/2017.  FINDINGS: CTA CHEST FINDINGS  Cardiovascular: Heart size is borderline enlarged. There is no significant pericardial fluid, thickening or pericardial calcification. There is aortic atherosclerosis, as well as atherosclerosis of the great vessels of the mediastinum and the coronary arteries, including calcified atherosclerotic plaque in the left main coronary artery. Severe thickening calcification of the aortic valve.  Mediastinum/Lymph Nodes: No pathologically enlarged mediastinal or hilar lymph nodes. Esophagus is unremarkable in appearance. No axillary lymphadenopathy.  Lungs/Pleura: Several small pulmonary nodules are noted throughout the lungs bilaterally, generally 4 mm or less in size. The largest pulmonary nodule is a ground-glass attenuation nodule in the superior segment of the left lower lobe (axial image 39 of series 16) measuring 12 x 10 mm. No acute consolidative airspace disease. No pleural effusions.  Musculoskeletal/Soft Tissues: There are no aggressive appearing lytic or blastic  lesions noted in the visualized portions of the skeleton.  CTA ABDOMEN AND PELVIS FINDINGS  Hepatobiliary: No suspicious cystic or solid hepatic lesions. No intra or extrahepatic biliary ductal dilatation. Gallbladder is normal in appearance.  Pancreas: No pancreatic mass. No pancreatic ductal dilatation. No pancreatic or peripancreatic fluid or inflammatory changes.  Spleen: Unremarkable.  Adrenals/Urinary Tract: Small cortical calcification in the anterior aspect of the interpolar region of the left kidney is stable dating back to 2011, presumably benign. Right kidney and bilateral adrenal glands are normal in appearance. No hydroureteronephrosis. Urinary bladder is normal in appearance.  Stomach/Bowel: Normal appearance of the stomach. No pathologic dilatation of small bowel or colon. A few scattered colonic diverticulae are noted, without surrounding inflammatory changes to suggest an acute diverticulitis at this time. The appendix is not confidently identified and may be surgically absent. Regardless, there are no inflammatory changes noted adjacent to the cecum to suggest the presence of an acute appendicitis at this time.  Vascular/Lymphatic: Aortic atherosclerosis, with vascular findings and measurements pertinent to potential TAVR procedure, as detailed below. No aneurysm or dissection noted in the abdominal or pelvic vasculature. No lymphadenopathy noted in the abdomen or pelvis.  Reproductive: Uterus and ovaries are unremarkable in appearance.  Other: No significant volume of ascites.  No pneumoperitoneum.  Musculoskeletal: There are no aggressive appearing lytic or blastic lesions noted in the visualized portions of the skeleton.  VASCULAR MEASUREMENTS PERTINENT TO TAVR:  AORTA:  Minimal Aortic Diameter-15 x 14 mm  Severity of Aortic Calcification-moderate  RIGHT PELVIS:  Right Common Iliac Artery -  Minimal Diameter-8.7 x 8.8  mm  Tortuosity-mild  Calcification-moderate  Right External Iliac Artery -  Minimal Diameter-7.0 x 7.9 mm  Tortuosity - mild  Calcification-none  Right Common Femoral Artery -  Minimal Diameter-8.1 x 8.5 mm  Tortuosity -  mild  Calcification-none  LEFT PELVIS:  Left Common Iliac Artery -  Minimal Diameter-8.7 x 7.9 mm  Tortuosity - mild  Calcification-moderate  Left External Iliac Artery -  Minimal Diameter-7.1 x 7.0 mm  Tortuosity - mild  Calcification-none  Left Common Femoral Artery -  Minimal Diameter-7.7 x 6.8 mm  Tortuosity - mild  Calcification-none  Review of the MIP images confirms the above findings.  IMPRESSION: 1. Vascular findings and measurements pertinent to potential TAVR procedure, as detailed above. 2. Severe thickening and calcification of the aortic valve, compatible with reported clinical history of severe aortic stenosis. 3. Aortic atherosclerosis, in addition to left main coronary artery disease. 4. Multiple small pulmonary nodules in the lungs bilaterally, largest of which is a ground-glass attenuation nodule in the superior segment of the left lower lobe measuring 12 x 10 mm. Initial follow-up with CT at 6-12 months is recommended to confirm persistence. If persistent, repeat CT is recommended every 2 years until 5 years of stability has been established. This recommendation follows the consensus statement: Guidelines for Management of Incidental Pulmonary Nodules Detected on CT Images: From the Fleischner Society 2017; Radiology 2017; 284:228-243. 5. Colonic diverticulosis without evidence of acute diverticulitis at this time. 6. Additional incidental findings, as above.   Electronically Signed   By: Vinnie Langton M.D.   On: 04/16/2019 13:22  [facility] Procedure: Isolated AVR Risk of Mortality: 0.893% Renal Failure: 0.333% Permanent Stroke: 0.817% Prolonged  Ventilation: 2.664% DSW Infection: 0.030% Reoperation: 2.712% Morbidity or Mortality: 5.163% Short Length of Stay: 64.648% Long Length of Stay: 1.414%  Impression:  This 63 year old woman has stage C, asymptomatic, severe aortic stenosis and moderate aortic insufficiency and is New York Heart Association class I.  She remains quite active walking 10 to 11 miles per day and denies any hint of symptoms.  I have personally reviewed her 2D echocardiogram, cardiac catheterization, and CTA studies.  Her 2D echocardiogram shows a trileaflet aortic valve with thickening and calcification of the valve leaflets and markedly restricted valve mobility.  The mean gradient across the valve is 77 mmHg consistent with severe aortic stenosis and there is moderate aortic insufficiency with a pressure half-time of 297 ms.  Left ventricular systolic function is normal.  Cardiac catheterization showed a mean gradient of 37 mmHg across the aortic valve with a peak gradient of 52 mmHg and a calculated aortic valve area of 1.09 consistent with severe aortic stenosis.  There is no coronary disease.  I agree that given the appearance of her aortic valve and the high gradient in combination with moderate aortic insufficiency it would be best to proceed with aortic valve replacement to prevent sudden decompensation and deterioration of left ventricular function.  The patient was counseled at length regarding treatment alternatives for management of severe aortic stenosis. The risks and benefits of surgical intervention has been discussed in detail. Long-term prognosis with medical therapy was discussed. Alternative approaches such as conventional surgical aortic valve replacement, transcatheter aortic valve replacement, and palliative medical therapy were compared and contrasted at length. This discussion was placed in the context of the patient's own specific clinical presentation and past medical history. All of their  questions been addressed.  She said that she would definitely rather undergo TAVR then open surgical aortic valve replacement since she is very busy at work and would like to continue to do so and care of 2 grandchildren at home.  Her surgical risk is low but I think TAVR is a reasonable alternative  at her age.  I discussed the limited long-term data on transcatheter valve durability and the possibility that she may have structural valve deterioration requiring reintervention in the future.  She said that she understands this but feels that TAVR is the best option for her at this time.  Her gated cardiac CTA shows anatomy suitable for transcatheter aortic valve replacement using a Sapien 3 valve.  Her abdominal and pelvic CTA shows adequate pelvic vascular anatomy to allow transfemoral insertion.  Her chest CTA shows several small pulmonary nodules throughout the lungs bilaterally that are 4 mm or less in size.  The largest is a groundglass attenuation nodule in the superior segment of left lower lobe measuring 12 x 10 mm.  She said that these have been followed by Dr. Baltazar Apo of pulmonary medicine and have generally been stable.  These will require continued follow-up given her age and smoking history.  Following the decision to proceed with transcatheter aortic valve replacement, a discussion was held regarding what types of management strategies would be attempted intraoperatively in the event of life-threatening complications, including whether or not the patient would be considered a candidate for the use of cardiopulmonary bypass and/or conversion to open sternotomy for attempted surgical intervention.  She is a low risk surgical patient and would be a candidate for emergent sternotomy to manage any intraoperative complications.    The patient has been advised of a variety of complications that might develop including but not limited to risks of death, stroke, paravalvular leak, aortic dissection or  other major vascular complications, aortic annulus rupture, device embolization, cardiac rupture or perforation, mitral regurgitation, acute myocardial infarction, arrhythmia, heart block or bradycardia requiring permanent pacemaker placement, congestive heart failure, respiratory failure, renal failure, pneumonia, infection, other late complications related to structural valve deterioration or migration, or other complications that might ultimately cause a temporary or permanent loss of functional independence or other long term morbidity. The patient provides full informed consent for the procedure as described and all questions were answered.    Plan:  The patient will think about the timing of transfemoral transcatheter aortic valve replacement and will call back to schedule.  She would like to wait until after the holidays as long she is doing well.   I spent 60 minutes performing this consultation and > 50% of this time was spent face to face counseling and coordinating the care of this patient's severe aortic stenosis and moderate aortic insufficiency.      [name], MD 08/29/2019

## 2019-09-06 NOTE — Progress Notes (Signed)
CARDIOLOGY OFFICE NOTE  Date:  09/10/2019    [name] Date of Birth: January 06, 1956 Medical Record #[id number]  PCP:  Leighton Ruff, MD  Cardiologist:  Johnsie Cancel   Chief Complaint  Patient presents with  . Follow-up    Seen for Dr. Johnsie Cancel    History of Present Illness: [name] is a 63 y.o. female who presents today for a one month check. Seen for Dr. Johnsie Cancel.   She has a history of aortic valve disease - AS, dilated aortic root, palpitations intolerant to beta blocker, HTN, anxiety and renal cell CA followed at [facility]. 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 She had a carotid doppler in 2018 that showed no plaque.   Last seen last month - has been noted to have acceleration of her AS with no chest pain, no dyspnea and only lightheadedness - referred for TAVR evaluation - she has had CT scans and has met with Dr. Cyndia Bent.     The patient does not have symptoms concerning for COVID-19 infection (fever, chills, cough, or new shortness of breath).   Comes in today. Here alone. Feels good. No chest pain. Breathing is fine. No syncope. Working long hours with the post office. No problems post cath.   Past Medical History:  Diagnosis Date  . Chronic kidney disease   . Diverticulitis, colon   . GERD (gastroesophageal reflux disease)   . Hypertension   . Renal cancer [facility]) 2007   Surgically removed from right kidney    Past Surgical History:  Procedure Laterality Date  . APPENDECTOMY    . HERNIA REPAIR    . OVARIAN CYST SURGERY    . RIGHT/LEFT HEART CATH AND CORONARY ANGIOGRAPHY N/A 08/22/2019   Procedure: RIGHT/LEFT HEART CATH AND CORONARY ANGIOGRAPHY;  Surgeon: Belva Crome, MD;  Location: [facility];  Service: Cardiovascular;  Laterality: N/A;     Medications: Current Meds  Medication Sig  . amLODipine (NORVASC) 5 MG tablet Take 1 tablet (5 mg total) by mouth daily.  . fluticasone (FLONASE) 50 MCG/ACT nasal spray Place 2 sprays  into both nostrils daily as needed for allergies.   . Lidocaine 4 % PTCH Apply 1 patch topically daily as needed (pain).  . naproxen sodium (ALEVE) 220 MG tablet Take 440 mg by mouth daily as needed (pain).  Marland Kitchen omeprazole (PRILOSEC) 40 MG capsule Take 40 mg by mouth daily as needed (heartburn).  . Tetrahydrozoline HCl (VISINE OP) Place 1 drop into both eyes daily as needed (itching / burning).     Allergies: Allergies  Allergen Reactions  . Cyprodenate Itching  . Diphenhydramine Hcl     loopy  . Codeine Itching  . Morphine And Related Nausea Only    Anxiety and Nausea     Social History: The patient  reports that she quit smoking about 13 years ago. Her smoking use included cigarettes. She has a 10.00 pack-year smoking history. She has never used smokeless tobacco. She reports current alcohol use. She reports that she does not use drugs.   Family History: The patient's family history includes Cancer in her father, mother, and sister; Heart attack in her paternal aunt and paternal grandfather; Hypertension in her father.   Review of Systems: Please see the history of present illness.   All other systems are reviewed and negative.   Physical Exam: VS:  BP 134/76   Pulse 89   Ht _0  (1.676 m)  Wt 147 lb 1.9 oz (66.7 kg)   SpO2 96%   BMI 23.75 kg/m  .  BMI Body mass index is 23.75 kg/m.  Wt Readings from Last 3 Encounters:  09/10/19 147 lb 1.9 oz (66.7 kg)  08/29/19 145 lb (65.8 kg)  08/22/19 145 lb (65.8 kg)    General: Pleasant. Well developed, well nourished and in no acute distress.   HEENT: Normal.  Neck: Supple, no JVD, carotid bruits, or masses noted.  Cardiac: Regular rate and rhythm. Loud III/VI systolic murmur heard thru out the precordium. No edema.  Respiratory:  Lungs are clear to auscultation bilaterally with normal work of breathing.  GI: Soft and nontender.  MS: No deformity or atrophy. Gait and ROM intact.  Skin: Warm and dry. Color is normal.   Neuro:  Strength and sensation are intact and no gross focal deficits noted.  Psych: Alert, appropriate and with normal affect.   LABORATORY DATA:  EKG:  EKG is not ordered today.  Lab Results  Component Value Date   WBC 5.8 08/17/2019   HGB 12.9 08/22/2019   HCT 38.0 08/22/2019   PLT 269 08/17/2019   GLUCOSE 97 08/17/2019   ALT 17 02/28/2016   AST 22 02/28/2016   NA 140 08/22/2019   K 3.6 08/22/2019   CL 99 08/17/2019   CREATININE 0.55 (L) 08/17/2019   BUN 16 08/17/2019   CO2 26 08/17/2019   TSH 0.70 12/05/2008   INR 0.99 10/21/2009   HGBA1C 6.1 (H) 12/11/2008       BNP (last 3 results) No results for input(s): BNP in the last 8760 hours.  ProBNP (last 3 results) No results for input(s): PROBNP in the last 8760 hours.   Other Studies Reviewed Today:  RIGHT/LEFT HEART CATH AND CORONARY ANGIOGRAPHY 07/2019  Conclusion   Moderate to severe aortic stenosis with calculated aortic valve area 1.07 cm, peak to peak gradient 52 mmHg, mean gradient 32 mmHg, and peak instantaneous gradient 56 mmHg.  Cardiac output 6.8 L/min.  Right dominant with normal coronary arteries.  Previously documented normal LV systolic function.  LVEDP 2 mmHg.  RECOMMENDATIONS:   Heart valve team evaluation and management of significant aortic stenosis.    ECHO IMPRESSIONS 07/2019   1. The aortic valve is abnormal Aortic valve regurgitation is moderate by color flow Doppler. Severe aortic valve stenosis. Vmax 5.2 m/s, MG 77 mmHg, AVA 0.76 cm2, DI 0.23. Compared to prior TTE 12/27/18, there is a significant increase in AV gradients,  though max gradients were obtained from right parasternal view and did not have a good right parasternal view on prior echo  2. Left ventricular ejection fraction, by visual estimation, is 60 to 65%. The left ventricle has normal function. Normal left ventricular size. There is mildly increased left ventricular hypertrophy.  3. Elevated mean left atrial  pressure.  4. Left ventricular diastolic Doppler parameters are consistent with pseudonormalization pattern of LV diastolic filling.  5. Global right ventricle has normal systolic function.The right ventricular size is normal. No increase in right ventricular wall thickness.  6. Left atrial size was mildly dilated.  7. Right atrial size was normal.  8. Trivial pericardial effusion is present.  9. The mitral valve is normal in structure. Trace mitral valve regurgitation. 10. The tricuspid valve is normal in structure. Tricuspid valve regurgitation was not visualized by color flow Doppler. 11. The pulmonic valve was not well visualized. Pulmonic valve regurgitation is not visualized by color flow Doppler. 12. There is mild  dilatation of the ascending aorta measuring 39 mm. 13. The inferior vena cava is normal in size with greater than 50% respiratory variability, suggesting right atrial pressure of 3 mmHg.  Assessment/Plan:  1. Severe AS and moderate AI - NYHA I - has opted for TAVR rather than open surgical replacement. Noted to be a candidate for emergent sternotomy if has any intraoperative complications. She was to call back to schedule her date - was thinking after the holidays and sometime in mid January - will get her a TAVR consult next month with Dr. Burt Knack. TAVR brochure given today.   2. Palpitations - not endorsed.   3. HTN - BP is fine.   4. Non obstructive CAD  5. Lung nodule - followed by Dr. Lamonte Sakai - reportedly to be generally stable. Will require follow up given smoking history.   6. COVID-19 Education: The signs and symptoms of COVID-19 were discussed with the patient and how to seek care for testing (follow up with PCP or arrange E-visit).  The importance of social distancing, staying at home, hand hygiene and wearing a mask when out in public were discussed today.  Current medicines are reviewed with the patient today.  The patient does not have concerns regarding  medicines other than what has been noted above.  The following changes have been made:  See above.  Labs/ tests ordered today include:   No orders of the defined types were placed in this encounter.    Disposition:   FU with TAVR team next month.    Patient is agreeable to this plan and will call if any problems develop in the interim.   SignedTruitt Merle, NP  09/10/2019 3:40 PM  [facility] [address] [place], [place]  [address] Phone: [phone] Fax: [phone]

## 2019-09-10 ENCOUNTER — Encounter: Payer: Self-pay | Admitting: Nurse Practitioner

## 2019-09-10 ENCOUNTER — Other Ambulatory Visit: Payer: Self-pay

## 2019-09-10 ENCOUNTER — Ambulatory Visit: Payer: Federal, State, Local not specified - PPO | Admitting: Nurse Practitioner

## 2019-09-10 VITALS — BP 134/76 | HR 89 | Ht 66.0 in | Wt 147.1 lb

## 2019-09-10 DIAGNOSIS — I350 Nonrheumatic aortic (valve) stenosis: Secondary | ICD-10-CM | POA: Diagnosis not present

## 2019-09-10 DIAGNOSIS — I10 Essential (primary) hypertension: Secondary | ICD-10-CM | POA: Diagnosis not present

## 2019-09-10 NOTE — Patient Instructions (Addendum)
After Visit Summary:  We will be checking the following labs today - NONE   Medication Instructions:    Continue with your current medicines.    If you need a refill on your cardiac medications before your next appointment, please call your pharmacy.     Testing/Procedures To Be Arranged:  N/A  Follow-Up:   See Dr. Burt Knack in December     At [facility], you and your health needs are our priority.  As part of our continuing mission to provide you with exceptional heart care, we have created designated Provider Care Teams.  These Care Teams include your primary Cardiologist (physician) and Advanced Practice Providers (APPs -  Physician Assistants and Nurse Practitioners) who all work together to provide you with the care you need, when you need it.  Special Instructions:  . Stay safe, stay home, wash your hands for at least 20 seconds and wear a mask when out in public.  . It was good to talk with you today.    Call the [place] office at [phone] if you have any questions, problems or concerns.

## 2019-10-05 ENCOUNTER — Other Ambulatory Visit: Payer: Self-pay | Admitting: Family Medicine

## 2019-10-05 DIAGNOSIS — Z1231 Encounter for screening mammogram for malignant neoplasm of breast: Secondary | ICD-10-CM

## 2019-10-12 ENCOUNTER — Other Ambulatory Visit: Payer: Self-pay

## 2019-10-12 ENCOUNTER — Encounter: Payer: Self-pay | Admitting: Cardiovascular Disease

## 2019-10-12 ENCOUNTER — Ambulatory Visit: Payer: Federal, State, Local not specified - PPO | Admitting: Cardiovascular Disease

## 2019-10-12 VITALS — BP 142/78 | HR 104 | Ht 66.0 in | Wt 146.8 lb

## 2019-10-12 DIAGNOSIS — I350 Nonrheumatic aortic (valve) stenosis: Secondary | ICD-10-CM

## 2019-10-12 NOTE — Progress Notes (Signed)
[facility]   MULTIDISCIPLINARY HEART VALVE TEAM  Date:  10/12/2019   ID:  [name], [name] 1956-07-17, MRN [id number]  PCP:  Leighton Ruff, MD   CC: Aortic Stenosis evaluation   HISTORY OF PRESENT ILLNESS: [name] is a 63 y.o. female who presents for evaluation of aortic stenosis, referred by Dr Johnsie Cancel.  The patient has been followed for aortic stenosis with serial echo studies over the last several years.  She has been noted to have progressive aortic stenosis over the last few years.  She has a trileaflet aortic valve and her mean gradient in 2018 was 28 mmHg, increasing to 37 mmHg in 2019 and on her most recent echocardiogram she had a marked increase in mean gradient of 77 mmHg and peak gradient of 109 mmHg with a calculated aortic valve area of 0.76 cm.  The patient has subsequently undergone heart team evaluation and further studies  The patient is here alone today.  She is doing well with no specific cardiovascular symptoms.  She denies chest pain, chest pressure, shortness of breath, lightheadedness, heart palpitations, leg edema, orthopnea, or PND.  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  She has had a heart murmur for many years and has been followed closely by Dr. Johnsie Cancel.  The patient has had had regular dental care and reports no problems.  Past Medical History:  Diagnosis Date  . Chronic kidney disease   . Diverticulitis, colon   . GERD (gastroesophageal reflux disease)   . Hypertension   . Renal cancer [facility]) 2007   Surgically removed from right kidney    Current Outpatient Medications  Medication Sig Dispense Refill  . amLODipine (NORVASC) 5 MG tablet Take 1 tablet (5 mg total) by mouth daily. (Patient not taking: Reported on 10/12/2019) 90 tablet 3  . fluticasone (FLONASE) 50 MCG/ACT nasal spray Place 2 sprays into both nostrils daily as needed for allergies.   1  . Lidocaine 4 % PTCH Apply 1 patch  topically daily as needed (pain).    . naproxen sodium (ALEVE) 220 MG tablet Take 440 mg by mouth daily as needed (pain).    Marland Kitchen omeprazole (PRILOSEC) 40 MG capsule Take 40 mg by mouth daily as needed (heartburn).    . Tetrahydrozoline HCl (VISINE OP) Place 1 drop into both eyes daily as needed (itching / burning).     No current facility-administered medications for this visit.    ALLERGIES:   Cyprodenate, Diphenhydramine hcl, Codeine, and Morphine and related   SOCIAL HISTORY:  The patient  reports that she quit smoking about 13 years ago. Her smoking use included cigarettes. She has a 10.00 pack-year smoking history. She has never used smokeless tobacco. She reports current alcohol use. She reports that she does not use drugs.   FAMILY HISTORY:  The patient's family history includes Cancer in her father, mother, and sister; Heart attack in her paternal aunt and paternal grandfather; Hypertension in her father.   REVIEW OF SYSTEMS:  Reviewed and negative except as above.   PHYSICAL EXAM: VS:  BP (!) 142/78   Pulse (!) 104   Ht 5\' 6"  (1.676 m)   Wt 146 lb 12.8 oz (66.6 kg)   SpO2 96%   BMI 23.69 kg/m  , BMI Body mass index is 23.69 kg/m. GEN: Well nourished, well developed, in no acute distress HEENT: normal Neck: No JVD. carotids 2+ with bilateral bruits, delayed upstrokes Cardiac:  The heart is RRR with 3/6 harsh late peaking systolic murmur at the right upper sternal border, absent A2.Marland Kitchen No edema. Pedal pulses 2+ = bilaterally  Respiratory:  clear to auscultation bilaterally GI: soft, nontender, nondistended, + BS MS: no deformity or atrophy Skin: warm and dry, no rash Neuro:  Strength and sensation are intact Psych: euthymic mood, full affect  RECENT LABS: 08/17/2019: BUN 16; Creatinine, Ser 0.55; Platelets 269 08/22/2019: Hemoglobin 12.9; Potassium 3.6; Sodium 140  No results found for requested labs within last 8760 hours.   CrCl cannot be calculated (Patient's most recent  lab result is older than the maximum 21 days allowed.).   Wt Readings from Last 3 Encounters:  10/12/19 146 lb 12.8 oz (66.6 kg)  09/10/19 147 lb 1.9 oz (66.7 kg)  08/29/19 145 lb (65.8 kg)     CARDIAC STUDIES:  Echo:  IMPRESSIONS    1. The aortic valve is abnormal Aortic valve regurgitation is moderate by color flow Doppler. Severe aortic valve stenosis. Vmax 5.2 m/s, MG 77 mmHg, AVA 0.76 cm2, DI 0.23. Compared to prior TTE 12/27/18, there is a significant increase in AV gradients,  though max gradients were obtained from right parasternal view and did not have a good right parasternal view on prior echo  2. Left ventricular ejection fraction, by visual estimation, is 60 to 65%. The left ventricle has normal function. Normal left ventricular size. There is mildly increased left ventricular hypertrophy.  3. Elevated mean left atrial pressure.  4. Left ventricular diastolic Doppler parameters are consistent with pseudonormalization pattern of LV diastolic filling.  5. Global right ventricle has normal systolic function.The right ventricular size is normal. No increase in right ventricular wall thickness.  6. Left atrial size was mildly dilated.  7. Right atrial size was normal.  8. Trivial pericardial effusion is present.  9. The mitral valve is normal in structure. Trace mitral valve regurgitation. 10. The tricuspid valve is normal in structure. Tricuspid valve regurgitation was not visualized by color flow Doppler. 11. The pulmonic valve was not well visualized. Pulmonic valve regurgitation is not visualized by color flow Doppler. 12. There is mild dilatation of the ascending aorta measuring 39 mm. 13. The inferior vena cava is normal in size with greater than 50% respiratory variability, suggesting right atrial pressure of 3 mmHg.  LEFT VENTRICLE PLAX 2D LVIDd:         4.60 cm  Diastology LVIDs:         2.80 cm  LV e' lateral:   4.10 cm/s LV PW:         1.20 cm  LV E/e' lateral:  21.2 LV IVS:        1.00 cm  LV e' medial:    7.29 cm/s LVOT diam:     2.10 cm  LV E/e' medial:  11.9 LV SV:         68 ml LV SV Index:   37.38 LVOT Area:     3.46 cm    RIGHT VENTRICLE RV Basal diam:  3.80 cm RV S prime:     20.70 cm/s TAPSE (M-mode): 2.2 cm  LEFT ATRIUM             Index       RIGHT ATRIUM           Index LA diam:        4.70 cm 2.63 cm/m  RA Area:     13.90 cm LA Vol (A2C):   64.4  ml 35.99 ml/m RA Volume:   40.40 ml  22.58 ml/m LA Vol (A4C):   67.4 ml 37.66 ml/m LA Biplane Vol: 65.8 ml 36.77 ml/m  AORTIC VALVE AV Area (Vmax):    0.83 cm AV Area (Vmean):   0.94 cm AV Area (VTI):     0.76 cm AV Vmax:           521.00 cm/s AV Vmean:          352.200 cm/s AV VTI:            1.240 m AV Peak Grad:      108.6 mmHg AV Mean Grad:      77.0 mmHg LVOT Vmax:         125.50 cm/s LVOT Vmean:        95.550 cm/s LVOT VTI:          0.271 m LVOT/AV VTI ratio: 0.22 AI PHT:            297 msec   AORTA Ao Root diam: 3.10 cm Ao Asc diam:  3.90 cm  MV E velocity: 86.90 cm/s  103 cm/s MV A velocity: 119.00 cm/s 70.3 cm/s SHUNTS MV E/A ratio:  0.73        1.5       Systemic VTI:  0.27 m  Cardiac Cath: Conclusion   Moderate to severe aortic stenosis with calculated aortic valve area 1.07 cm, peak to peak gradient 52 mmHg, mean gradient 32 mmHg, and peak instantaneous gradient 56 mmHg.  Cardiac output 6.8 L/min.  Right dominant with normal coronary arteries.  Previously documented normal LV systolic function.  LVEDP 2 mmHg.  RECOMMENDATIONS:   Heart valve team evaluation and management of significant aortic stenosis.   CTA Heart: FINDINGS: Aortic Valve: Tri leaflet calcified with restricted leaflet motion  Aorta: Aortic root dilatation mild calcific atherosclerosis Normal arch vessels  Sinotubular Junction: 29 mm  Ascending Thoracic Aorta: 40 mm  Aortic Arch: 26 mm  Descending Thoracic Aorta: 24 mm  Sinus of Valsalva  Measurements:  Non-coronary: 32 mm  Right - coronary: 30 mm  Left - coronary: 31 mm  Coronary Artery Height above Annulus:  Left Main: 13.7 mm above annulus  Right Coronary: 14 mm above annulus  Virtual Basal Annulus Measurements:  Maximum/Minimum Diameter: 24.9 mm x 19.4 mm  Perimeter: 85 mm  Area: 379 mm2  Coronary Arteries: Sufficient height above annulus for deployment  Optimum Fluoroscopic Angle for Delivery: LAO 24 Cranial 18 degrees  IMPRESSION: 1. Tri- leaflet AV with annular area of 379 mm2 suitable for a 23 mm Sapien 3 valve  2. Essentially normal right dominant coronary arteries 1-24% CAD RAD 1 LM stenosis  3.  Mild aortic root dilatation 4.0 cm  4.  Coronary arteries sufficient height above annulus for deployment  5. Optimum Angiographic angle for deployment LAO 24 Cranial 18 Degrees  CTA Chest/Abd/Pelvis: AORTA:  Minimal Aortic Diameter-15 x 14 mm  Severity of Aortic Calcification-moderate  RIGHT PELVIS:  Right Common Iliac Artery -  Minimal Diameter-8.7 x 8.8 mm  Tortuosity-mild  Calcification-moderate  Right External Iliac Artery -  Minimal Diameter-7.0 x 7.9 mm  Tortuosity - mild  Calcification-none  Right Common Femoral Artery -  Minimal Diameter-8.1 x 8.5 mm  Tortuosity - mild  Calcification-none  LEFT PELVIS:  Left Common Iliac Artery -  Minimal Diameter-8.7 x 7.9 mm  Tortuosity - mild  Calcification-moderate  Left External Iliac Artery -  Minimal Diameter-7.1 x 7.0 mm  Tortuosity -  mild  Calcification-none  Left Common Femoral Artery -  Minimal Diameter-7.7 x 6.8 mm  Tortuosity - mild  Calcification-none  Review of the MIP images confirms the above findings.  IMPRESSION: 1. Vascular findings and measurements pertinent to potential TAVR procedure, as detailed above. 2. Severe thickening and calcification of the aortic valve, compatible with  reported clinical history of severe aortic stenosis. 3. Aortic atherosclerosis, in addition to left main coronary artery disease. 4. Multiple small pulmonary nodules in the lungs bilaterally, largest of which is a ground-glass attenuation nodule in the superior segment of the left lower lobe measuring 12 x 10 mm. Initial follow-up with CT at 6-12 months is recommended to confirm persistence. If persistent, repeat CT is recommended every 2 years until 5 years of stability has been established. This recommendation follows the consensus statement: Guidelines for Management of Incidental Pulmonary Nodules Detected on CT Images: From the Fleischner Society 2017; Radiology 2017; 284:228-243. 5. Colonic diverticulosis without evidence of acute diverticulitis at this time. 6. Additional incidental findings, as above.   STS RISK CALCULATOR: RISK SCORES Procedure: Isolated AVR Risk of Mortality: 0.893% Renal Failure: 0.333% Permanent Stroke: 0.817% Prolonged Ventilation: 2.664% DSW Infection: 0.030% Reoperation: 2.712% Morbidity or Mortality: 5.163% Short Length of Stay: 64.648% Long Length of Stay: 1.414%  ASSESSMENT AND PLAN: The patient has severe, stage C aortic stenosis.  She has had progressive increase in transvalvular gradients over the last 3 years, now with very severe aortic stenosis with a peak systolic velocity greater than 5 m/s and mean gradient greater than 70 mmHg.  This places her at high risk even with asymptomatic disease and it is appropriate consider treatment options including aortic valve replacement.  I have personally reviewed her echo, cardiac catheterization, and CTA images.  She has suitable anatomy for transcatheter aortic valve replacement via transfemoral access.  She does have calcification of the proximal common iliac arteries but the lumen size is adequate for passage of a 14 French sheath.  I have reviewed the natural history of aortic stenosis  with the patient today. We have discussed the limitations of medical therapy and the poor prognosis associated with symptomatic aortic stenosis. We have reviewed potential treatment options, including palliative medical therapy, conventional surgical aortic valve replacement, and transcatheter aortic valve replacement. We discussed treatment options in the context of the patient's specific comorbid medical conditions.  The patient has undergone extensive evaluation with echo, cardiac catheterization, and CTA studies.  She has been evaluated by Dr. Cyndia Bent and is deemed an appropriate candidate for TAVR.  We have discussed the relative pros and cons of transcatheter versus conventional surgical aortic valve replacement.  The patient favors a less invasive approach and she understands that questions remain about long-term durability.  We have specifically reviewed risks, indications, and alternatives to TAVR.  She understands that risks include vascular injury, bleeding, MI, stroke, embolization of the valve, emergency surgery, infection, and arrhythmia including AV block requiring permanent pacing.  She understands these risks and agrees to proceed.  Full informed consent is obtained.  [name] 10/12/2019 9:04 AM     [facility] [address] [place] [address]  [phone] (office) [phone] (fax)

## 2019-11-05 ENCOUNTER — Other Ambulatory Visit: Payer: Self-pay

## 2019-11-05 ENCOUNTER — Encounter: Payer: Self-pay | Admitting: Physician Assistant

## 2019-11-05 ENCOUNTER — Ambulatory Visit: Payer: Federal, State, Local not specified - PPO | Admitting: Physical Therapy

## 2019-11-05 DIAGNOSIS — I350 Nonrheumatic aortic (valve) stenosis: Secondary | ICD-10-CM

## 2019-11-05 NOTE — Progress Notes (Signed)
Carotid order

## 2019-11-08 NOTE — Pre-Procedure Instructions (Signed)
Your procedure is scheduled on Tuesday, January 19th, from 10:45 AM to 12:31 PM.  Report to [facility] Main Entrance "A" at 08:00 A.M., and check in at the Admitting office.  Call this number if you have problems the morning of surgery:  [phone]  Call [phone] if you have any questions prior to your surgery date Monday-Friday 8am-4pm    Remember:  Do not eat or drink after midnight the night before your surgery.    Do not take any medications the morning of surgery.   As of today, STOP taking any Aspirin (unless otherwise instructed by your surgeon), Aleve, Naproxen, Ibuprofen, Motrin, Advil, Goody's, BC's, all herbal medications, fish oil, and all vitamins.    The Morning of Surgery  Do not wear jewelry, make-up or nail polish.  Do not wear lotions, powders, perfumes, or deodorant  Do not shave 48 hours prior to surgery.    Do not bring valuables to the hospital.  [facility] is not responsible for any belongings or valuables.  If you are a smoker, DO NOT Smoke 24 hours prior to surgery  If you wear a CPAP at night please bring your mask, tubing, and machine the morning of surgery   Remember that you must have someone to transport you home after your surgery, and remain with you for 24 hours if you are discharged the same day.   Please bring cases for contacts, glasses, hearing aids, dentures or bridgework because it cannot be worn into surgery.    Leave your suitcase in the car.  After surgery it may be brought to your room.  For patients admitted to the hospital, discharge time will be determined by your treatment team.  Patients discharged the day of surgery will not be allowed to drive home.    Special instructions:   [facility]- Preparing For Surgery  Before surgery, you can play an important role. Because skin is not sterile, your skin needs to be as free of germs as possible. You can reduce the number of germs on your skin by washing with CHG  (chlorahexidine gluconate) Soap before surgery.  CHG is an antiseptic cleaner which kills germs and bonds with the skin to continue killing germs even after washing.    Oral Hygiene is also important to reduce your risk of infection.  Remember - BRUSH YOUR TEETH THE MORNING OF SURGERY WITH YOUR REGULAR TOOTHPASTE  Please do not use if you have an allergy to CHG or antibacterial soaps. If your skin becomes reddened/irritated stop using the CHG.  Do not shave (including legs and underarms) for at least 48 hours prior to first CHG shower. It is OK to shave your face.  Please follow these instructions carefully.   1. Shower the NIGHT BEFORE SURGERY and the MORNING OF SURGERY with CHG Soap.   2. If you chose to wash your hair, wash your hair first as usual with your normal shampoo.  3. After you shampoo, rinse your hair and body thoroughly to remove the shampoo.  4. Use CHG as you would any other liquid soap. You can apply CHG directly to the skin and wash gently with a scrungie or a clean washcloth.   5. Apply the CHG Soap to your body ONLY FROM THE NECK DOWN.  Do not use on open wounds or open sores. Avoid contact with your eyes, ears, mouth and genitals (private parts). Wash Face and genitals (private parts)  with your normal soap.   6. Wash thoroughly,  paying special attention to the area where your surgery will be performed.  7. Thoroughly rinse your body with warm water from the neck down.  8. DO NOT shower/wash with your normal soap after using and rinsing off the CHG Soap.  9. Pat yourself dry with a CLEAN TOWEL.  10. Wear CLEAN PAJAMAS to bed the night before surgery, wear comfortable clothes the morning of surgery  11. Place CLEAN SHEETS on your bed the night of your first shower and DO NOT SLEEP WITH PETS.    Day of Surgery:   Remember to brush your teeth WITH YOUR REGULAR TOOTHPASTE. Please shower the morning of surgery with the CHG soap Do not apply any  deodorants/lotions. Please wear clean clothes to the hospital/surgery center.      Please read over the following fact sheets that you were given.

## 2019-11-09 ENCOUNTER — Other Ambulatory Visit (HOSPITAL_COMMUNITY): Payer: Federal, State, Local not specified - PPO

## 2019-11-09 ENCOUNTER — Encounter (HOSPITAL_COMMUNITY): Payer: Self-pay

## 2019-11-09 ENCOUNTER — Ambulatory Visit: Payer: Federal, State, Local not specified - PPO | Attending: Cardiovascular Disease | Admitting: Physical Therapy

## 2019-11-09 ENCOUNTER — Other Ambulatory Visit: Payer: Self-pay

## 2019-11-09 ENCOUNTER — Encounter (HOSPITAL_COMMUNITY)
Admission: RE | Admit: 2019-11-09 | Discharge: 2019-11-09 | Disposition: A | Discharge status: Home / Self Care | Payer: Federal, State, Local not specified - PPO | Source: Ambulatory Visit | Attending: Cardiovascular Disease | Admitting: Cardiovascular Disease

## 2019-11-09 ENCOUNTER — Ambulatory Visit (HOSPITAL_COMMUNITY)
Admission: RE | Admit: 2019-11-09 | Discharge: 2019-11-09 | Disposition: A | Discharge status: Home / Self Care | Payer: Federal, State, Local not specified - PPO | Source: Ambulatory Visit | Attending: Cardiovascular Disease | Admitting: Cardiovascular Disease

## 2019-11-09 ENCOUNTER — Encounter: Payer: Self-pay | Admitting: Physical Therapy

## 2019-11-09 ENCOUNTER — Other Ambulatory Visit (HOSPITAL_COMMUNITY)
Admission: RE | Admit: 2019-11-09 | Discharge: 2019-11-09 | Disposition: A | Discharge status: Home / Self Care | Payer: Federal, State, Local not specified - PPO | Source: Ambulatory Visit | Attending: Cardiovascular Disease | Admitting: Cardiovascular Disease

## 2019-11-09 DIAGNOSIS — I350 Nonrheumatic aortic (valve) stenosis: Secondary | ICD-10-CM | POA: Diagnosis not present

## 2019-11-09 DIAGNOSIS — Z01818 Encounter for other preprocedural examination: Secondary | ICD-10-CM | POA: Diagnosis not present

## 2019-11-09 DIAGNOSIS — R293 Abnormal posture: Secondary | ICD-10-CM | POA: Diagnosis not present

## 2019-11-09 DIAGNOSIS — Z20822 Contact with and (suspected) exposure to covid-19: Secondary | ICD-10-CM | POA: Insufficient documentation

## 2019-11-09 DIAGNOSIS — J449 Chronic obstructive pulmonary disease, unspecified: Secondary | ICD-10-CM | POA: Diagnosis not present

## 2019-11-09 HISTORY — DX: Cardiac murmur, unspecified: R01.1

## 2019-11-09 LAB — COMPREHENSIVE METABOLIC PANEL
ALT: 17 U/L (ref 0–44)
AST: 21 U/L (ref 15–41)
Albumin: 4.3 g/dL (ref 3.5–5.0)
Alkaline Phosphatase: 69 U/L (ref 38–126)
Anion gap: 9 (ref 5–15)
BUN: 18 mg/dL (ref 8–23)
CO2: 25 mmol/L (ref 22–32)
Calcium: 9.7 mg/dL (ref 8.9–10.3)
Chloride: 105 mmol/L (ref 98–111)
Creatinine, Ser: 0.54 mg/dL (ref 0.44–1.00)
GFR calc Af Amer: >60 mL/min (ref >60)
GFR calc non Af Amer: >60 mL/min (ref >60)
Glucose, Bld: 99 mg/dL (ref 70–99)
Potassium: 4.5 mmol/L (ref 3.5–5.1)
Sodium: 139 mmol/L (ref 135–145)
Total Bilirubin: 0.6 mg/dL (ref 0.3–1.2)
Total Protein: 7.6 g/dL (ref 6.5–8.1)

## 2019-11-09 LAB — ABO/RH: ABO/RH(D): O POS

## 2019-11-09 LAB — BLOOD GAS, ARTERIAL
Acid-Base Excess: 3.7 mmol/L — ABNORMAL HIGH (ref 0.0–2.0)
Bicarbonate: 27.9 mmol/L (ref 20.0–28.0)
Drawn by: 421801
FIO2: 21
O2 Saturation: 97.3 %
Patient temperature: 37
pCO2 arterial: 44 mmHg (ref 32.0–48.0)
pH, Arterial: 7.419 (ref 7.350–7.450)
pO2, Arterial: 98.7 mmHg (ref 83.0–108.0)

## 2019-11-09 LAB — URINALYSIS, ROUTINE W REFLEX MICROSCOPIC
Bilirubin Urine: NEGATIVE
Glucose, UA: NEGATIVE mg/dL
Hgb urine dipstick: NEGATIVE
Ketones, ur: NEGATIVE mg/dL
Leukocytes,Ua: NEGATIVE
Nitrite: NEGATIVE
Protein, ur: NEGATIVE mg/dL
Specific Gravity, Urine: 1.005 (ref 1.005–1.030)
pH: 7 (ref 5.0–8.0)

## 2019-11-09 LAB — CBC
HCT: 41.6 % (ref 36.0–46.0)
Hemoglobin: 13.2 g/dL (ref 12.0–15.0)
MCH: 28.6 pg (ref 26.0–34.0)
MCHC: 31.7 g/dL (ref 30.0–36.0)
MCV: 90.2 fL (ref 80.0–100.0)
Platelets: 249 10*3/uL (ref 150–400)
RBC: 4.61 MIL/uL (ref 3.87–5.11)
RDW: 13.1 % (ref 11.5–15.5)
WBC: 5.5 10*3/uL (ref 4.0–10.5)
nRBC: 0 % (ref 0.0–0.2)

## 2019-11-09 LAB — TYPE AND SCREEN
ABO/RH(D): O POS
Antibody Screen: NEGATIVE

## 2019-11-09 LAB — PROTIME-INR
INR: 0.9 (ref 0.8–1.2)
Prothrombin Time: 12.1 seconds (ref 11.4–15.2)

## 2019-11-09 LAB — HEMOGLOBIN A1C
Hgb A1c MFr Bld: 5.5 % (ref 4.8–5.6)
Mean Plasma Glucose: 111.15 mg/dL

## 2019-11-09 LAB — APTT: aPTT: 28 seconds (ref 24–36)

## 2019-11-09 LAB — SURGICAL PCR SCREEN
MRSA, PCR: NEGATIVE
Staphylococcus aureus: NEGATIVE

## 2019-11-09 LAB — BRAIN NATRIURETIC PEPTIDE: B Natriuretic Peptide: 28 pg/mL (ref 0.0–100.0)

## 2019-11-09 NOTE — Progress Notes (Signed)
Carotid artery duplex has been completed. Preliminary results can be found in CV Proc through chart review.   11/09/19 11:21 AM Carlos Levering RVT

## 2019-11-09 NOTE — Therapy (Signed)
[place], [place], [address] Phone: [phone]   Fax:  [phone]  Physical Therapy Evaluation  Patient Details  Name: [name] MRN: [id number] Date of Birth: October 03, 1956 Referring Provider (PT): Sherren Mocha, MD   Encounter Date: 11/09/2019  PT End of Session - 11/09/19 1158    Visit Number  1    Number of Visits  1    Authorization Type  [employer]    PT Start Time  [id number]    PT Stop Time  1157    PT Time Calculation (min)  23 min    Activity Tolerance  Patient tolerated treatment well    Behavior During Therapy  [facility] for tasks assessed/performed       Past Medical History:  Diagnosis Date  . Diverticulitis, colon   . GERD (gastroesophageal reflux disease)   . Heart murmur   . Hypertension   . Pulmonary nodules    seen on pre TAVR CT, needs follow up   . Renal cancer [facility] [place]) 2007   Surgically removed from right kidney    Past Surgical History:  Procedure Laterality Date  . APPENDECTOMY    . COLON SURGERY    . HERNIA REPAIR    . OVARIAN CYST SURGERY    . RIGHT/LEFT HEART CATH AND CORONARY ANGIOGRAPHY N/A 08/22/2019   Procedure: RIGHT/LEFT HEART CATH AND CORONARY ANGIOGRAPHY;  Surgeon: Belva Crome, MD;  Location: [facility];  Service: Cardiovascular;  Laterality: N/A;    There were no vitals filed for this visit.   Subjective Assessment - 11/09/19 1137    Subjective  Patient reports she had a heart murmur and they found stenosis of aortic valve, it has recently gotten worse within past month. She really hasn't had many symptoms. 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    Limitations  Walking    How long can you sit comfortably?  No limitation    How long can you stand comfortably?  No limitation    How long can you walk comfortably?  No limitation    Patient Stated Goals  Get heart better    Currently in Pain?  No/denies         [facility] PT  Assessment - 11/09/19 0001      Assessment   Medical Diagnosis  Severe Aortic Stenosis    Referring Provider (PT)  Sherren Mocha, MD    Onset Date/Surgical Date  --   within past month   Hand Dominance  Right    Next MD Visit  11/13/2019    Prior Therapy  No      Precautions   Precautions  None      Restrictions   Weight Bearing Restrictions  No      Balance Screen   Has the patient fallen in the past 6 months  No    Has the patient had a decrease in activity level because of a fear of falling?   No    Is the patient reluctant to leave their home because of a fear of falling?   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      Cognition  Overall Cognitive Status  Within Functional Limits for tasks assessed      Observation/Other Assessments   Observations  Patient appears in no apparent distress    Focus on Therapeutic Outcomes (FOTO)   NA      Posture/Postural Control   Posture Comments  Patient exhibits rounded shoulder and forward head posture with increased thoracic kyphosis      ROM / Strength   AROM / PROM / Strength  Strength;AROM      AROM   Overall AROM Comments  Patient exhibit gross UE and LE AROM WFL and non-painful      Strength   Overall Strength Comments  Patient exhibits gross UE and LE strength 5/5 wand non-painful    Strength Assessment Site  Hand    Right/Left hand  Right;Left    Right Hand Grip (lbs)  26    Left Hand Grip (lbs)  24      Transfers   Transfers  Independent with all Transfers      Ambulation/Gait   Ambulation/Gait  Yes    Ambulation/Gait Assistance  7: Independent    Gait Pattern  Within Functional Limits       [facility] Pre-Surgical Assessment - 11/09/19 0001    5 Meter Walk Test- trial 1  3 sec    5 Meter Walk Test-  trial 2  3 sec.     5 Meter Walk Test- trial 3  3 sec.    5 meter walk test average  3 sec    4 Stage Balance Test tolerated for:   10 sec.    4 Stage Balance Test Position  4    comment  --    Sit To Stand Test- trial 1  9 sec.    ADL/IADL Independent with:  Bathing;Dressing;Meal prep;Finances;Yard work    ADL/IADL Therapist, sports Index  Vulnerable    6 Minute Walk- Baseline  yes    BP (mmHg)  146/88    HR (bpm)  95    02 Sat (%RA)  95 %    Modified Borg Scale for Dyspnea  0- Nothing at all    Perceived Rate of Exertion (Borg)  6-    6 Minute Walk Post Test  yes    BP (mmHg)  (!) 147/91    HR (bpm)  115    02 Sat (%RA)  96 %    Modified Borg Scale for Dyspnea  0- Nothing at all    Perceived Rate of Exertion (Borg)  9- very light    Aerobic Endurance Distance Walked  1950              Objective measurements completed on examination: See above findings.              PT Education - 11/09/19 1200    Education Details  Activity modification PRN, posture    Person(s) Educated  Patient    Methods  Explanation    Comprehension  Verbalized understanding                  Plan - 11/09/19 1158    Clinical Impression Statement  See below.    Personal Factors and Comorbidities  Comorbidity 3+    Comorbidities  She has a history of aortic valve disease - AS, dilated aortic root, palpitations intolerant to beta blocker, HTN, anxiety and renal cell CA    Examination-Activity Limitations  Locomotion Level    Examination-Participation Restrictions  Community Activity    Stability/Clinical Decision  Making  Stable/Uncomplicated    Clinical Decision Making  Low    PT Frequency  One time visit    Consulted and Agree with Plan of Care  Patient      Clinical Impression Statement: Pt is a 64 yo female presenting to OP PT for evaluation prior to possible TAVR surgery due to severe aortic stenosis. Pt reports onset of aortic stenosis worsening approximately 1 months ago.  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 Pt presents with good ROM and strength, good balance and is not at high fall risk 4 stage balance test, good walking speed and good aerobic endurance per 6 minute walk test. Pt ambulated a total of 1950 feet in 6 minute walk. Based on the Short Physical Performance Battery, patient has a frailty rating of 12/12 with </= 5/12 considered frail.   Patient demonstrated the following deficits and impairments:  Cardiopulmonary status limiting activity, Postural dysfunction  Visit Diagnosis: Abnormal posture     Problem List Patient Active Problem List   Diagnosis Date Noted  . 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 Pulmonary nodules/lesions, multiple 04/21/2016  . Aortic stenosis 02/28/2016  . GERD (gastroesophageal reflux disease) 02/28/2016  . Common bile duct dilation 02/28/2016  . Chest pain   . KIDNEY CANCER 12/11/2008  . Essential hypertension 11/27/2008  . Palpitations 11/27/2008  . MURMUR 11/27/2008  . CHEST PAIN, ATYPICAL 11/27/2008    Hilda Blades, PT, DPT, LAT, ATC 11/09/19  12:15 PM Phone: [phone] Fax: [facility] [address] [place], [place], [address] Phone: [phone]   Fax:  [phone]  Name: LAYLIANNA LAVE MRN: [id number] Date of Birth: [birth year]

## 2019-11-09 NOTE — Progress Notes (Signed)
PCP - Leighton Ruff, MD Cardiologist - Jenkins Rouge, MD  PPM/ICD - Denies  Chest x-ray - 11/09/19 EKG - 11/09/19 Stress Test - 08/04/18 ECHO - 08/14/19 Cardiac Cath - 08/22/19  Sleep Study - Denies  Patient denies being a diabetic  Blood Thinner Instructions: N/A  ERAS Protcol - None  COVID TEST- 11/09/2019   Anesthesia review: Yes, Cardiac hx  Patient denies shortness of breath, fever, cough and chest pain at PAT appointment  Coronavirus Screening  Have you experienced the following symptoms:  Cough yes/no: No Fever (>100.91F)  yes/no: No Runny nose yes/no: No Sore throat yes/no: No Difficulty breathing/shortness of breath  yes/no: No  Have you or a family member traveled in the last 14 days and where? yes/no: No   If the patient indicates "YES" to the above questions, their PAT will be rescheduled to limit the exposure to others and, the surgeon will be notified. THE PATIENT WILL NEED TO BE ASYMPTOMATIC FOR 14 DAYS.   If the patient is not experiencing any of these symptoms, the PAT nurse will instruct them to NOT bring anyone with them to their appointment since they may have these symptoms or traveled as well.   Please remind your patients and families that hospital visitation restrictions are in effect and the importance of the restrictions.    All instructions explained to the patient, with a verbal understanding of the material. Patient agrees to go over the instructions while at home for a better understanding. Patient also instructed to self quarantine after being tested for COVID-19. The opportunity to ask questions was provided.

## 2019-11-10 LAB — NOVEL CORONAVIRUS, NAA (HOSP ORDER, SEND-OUT TO REF LAB; TAT 18-24 HRS): SARS-CoV-2, NAA: NOT DETECTED

## 2019-11-12 MED ORDER — SODIUM CHLORIDE 0.9 % IV SOLN
1.5000 g | INTRAVENOUS | Status: DC
  Filled 2019-11-12: qty 1.5

## 2019-11-12 MED ORDER — MAGNESIUM SULFATE 50 % IJ SOLN
40.0000 meq | INTRAMUSCULAR | Status: DC
  Filled 2019-11-12: qty 9.85

## 2019-11-12 MED ORDER — POTASSIUM CHLORIDE 2 MEQ/ML IV SOLN
80.0000 meq | INTRAVENOUS | Status: DC
  Filled 2019-11-12: qty 40

## 2019-11-12 MED ORDER — NOREPINEPHRINE 4 MG/250ML-% IV SOLN
0.0000 ug/min | INTRAVENOUS | Status: DC
  Filled 2019-11-12: qty 250

## 2019-11-12 MED ORDER — SODIUM CHLORIDE 0.9 % IV SOLN
INTRAVENOUS | Status: DC
  Filled 2019-11-12: qty 30

## 2019-11-12 MED ORDER — VANCOMYCIN HCL 1250 MG/250ML IV SOLN
1250.0000 mg | INTRAVENOUS | Status: AC
  Administered 2019-11-13: 14:00:00 1250 mg via INTRAVENOUS
  Filled 2019-11-12: qty 250

## 2019-11-12 MED ORDER — DEXMEDETOMIDINE HCL IN NACL 400 MCG/100ML IV SOLN
0.1000 ug/kg/h | INTRAVENOUS | Status: AC
  Administered 2019-11-13: 14:00:00 65.1 ug via INTRAVENOUS
  Filled 2019-11-12: qty 100

## 2019-11-12 NOTE — H&P (Signed)
[address]       [place] [address]             [phone]      Cardiothoracic Surgery Admission History and Physical   Referring Provider is Josue Hector, MD  Primary Cardiologist is Jenkins Rouge, MD  PCP is Leighton Ruff, MD      Chief Complaint  Patient presents with  . Aortic Stenosis       HPI:  The patient is a 64 year old woman with a history of hypertension and aortic stenosis that has been followed by periodic echocardiogram. Her first echocardiogram documented in [facility] showed a mean gradient of 17 mmHg across a trileaflet aortic valve. She had a mean gradient of 28 mmHg measured in March 2018. Then in September 2019 the mean gradient had increased to 37 mmHg with a peak gradient of 69 mmHg and a valve area of 0.8 cm. She had a follow-up echocardiogram in March 2020 and the mean gradient was stable at 36 mmHg. Her most recent follow-up echocardiogram on 08/14/2019 showed an increase in the mean gradient to 77 mmHg with a peak gradient of 109 mmHg and an aortic valve area of 0.76 cm. It was felt that the significant increase in the gradient compared to her previous echo was due to the gradient being measured from the right parasternal view on her most recent echo but she did not a have good right parasternal view on her prior echo. 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 She estimates that she walks about 10 or 11 miles per day. She denies any symptoms. She has had no shortness of breath or fatigue. She denies any chest pain or pressure. She denies dizziness. She has had no peripheral edema.  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 She is married and lives with her husband and cares for 2 grandchildren.      Past Medical History:  Diagnosis Date  . Chronic kidney disease   . Diverticulitis, colon   . GERD (gastroesophageal reflux disease)   . Hypertension   . Renal cancer  [facility]) 2007   Surgically removed from right kidney        Past Surgical History:  Procedure Laterality Date  . APPENDECTOMY    . HERNIA REPAIR    . OVARIAN CYST SURGERY    . RIGHT/LEFT HEART CATH AND CORONARY ANGIOGRAPHY N/A 08/22/2019   Procedure: RIGHT/LEFT HEART CATH AND CORONARY ANGIOGRAPHY; Surgeon: Belva Crome, MD; Location: [facility]; Service: Cardiovascular; Laterality: N/A;        Family History  Problem Relation Age of Onset  . Cancer Mother    Lung  . Cancer Father    Lung  . Hypertension Father   . Cancer Sister    Lung  . Heart attack Paternal Grandfather   . Heart attack Paternal Aunt    Social History        Socioeconomic History  . Marital status: Married    Spouse name: Not on file  . Number of children: Not on file  . Years of education: Not on file  . Highest education level: Not on file  Occupational History  . Not on file  Social Needs  . Financial resource strain: Not on file  . Food insecurity    Worry: Not on file    Inability: Not on file  .  Transportation needs    Medical: Not on file    Non-medical: Not on file  Tobacco Use  . Smoking status: Former Smoker    Packs/day: 1.00    Years: 10.00    Pack years: 10.00    Types: Cigarettes    Quit date: 10/25/2005    Years since quitting: 13.8  . Smokeless tobacco: Never Used  . Tobacco comment: quit around 2007   Substance and Sexual Activity  . Alcohol use: Yes    Comment: occ  . Drug use: No  . Sexual activity: Not on file  Lifestyle  . Physical activity    Days per week: Not on file    Minutes per session: Not on file  . Stress: Not on file  Relationships  . Social Herbalist on phone: Not on file    Gets together: Not on file    Attends religious service: Not on file    Active member of club or organization: Not on file    Attends meetings of clubs or organizations: Not on file    Relationship status: Not on file  . Intimate partner violence    Fear of  current or ex partner: Not on file    Emotionally abused: Not on file    Physically abused: Not on file    Forced sexual activity: Not on file  Other Topics Concern  . Not on file  Social History Narrative  . Not on file         Current Outpatient Medications  Medication Sig Dispense Refill  . amLODipine (NORVASC) 5 MG tablet Take 1 tablet (5 mg total) by mouth daily. 90 tablet 3  . fluticasone (FLONASE) 50 MCG/ACT nasal spray Place 2 sprays into both nostrils daily as needed for allergies.   1  . Lidocaine 4 % PTCH Apply 1 patch topically daily as needed (pain).    . naproxen sodium (ALEVE) 220 MG tablet Take 440 mg by mouth daily as needed (pain).    Marland Kitchen omeprazole (PRILOSEC) 40 MG capsule Take 40 mg by mouth daily as needed (heartburn).    . Tetrahydrozoline HCl (VISINE OP) Place 1 drop into both eyes daily as needed (itching / burning).     No current facility-administered medications for this visit.         Allergies  Allergen Reactions  . Cyprodenate Itching  . Diphenhydramine Hcl     loopy  . Codeine Itching  . Morphine And Related Nausea Only    Anxiety and Nausea    Review of Systems:    General: normal appetite, normal energy, no weight gain, no weight loss, no fever  Cardiac: no chest pain with exertion, no chest pain at rest, noSOB with no exertion, no resting SOB, no PND, no orthopnea, no palpitations, no arrhythmia, no atrial fibrillation, no LE edema, no dizzy spells, no syncope  Respiratory: no shortness of breath, no home oxygen, no productive cough, no dry cough, no bronchitis, no wheezing, no hemoptysis, no asthma, no pain with inspiration or cough, no sleep apnea, no CPAP at night  GI: no difficulty swallowing, no reflux, no frequent heartburn, no hiatal hernia, no abdominal pain, no constipation, no diarrhea, no hematochezia, no hematemesis, no melena  GU: no dysuria, no frequency, no urinary tract infection, no hematuria, no kidney stones, no kidney disease    Vascular: no pain suggestive of claudication, no pain in feet, no leg cramps, no varicose veins, no DVT, no non-healing foot  ulcer  Neuro: no stroke, no TIA's, no seizures, no headaches, no temporary blindness one eye, no slurred speech, no peripheral neuropathy, no chronic pain, no instability of gait, no memory/cognitive dysfunction  Musculoskeletal: no arthritis, no joint swelling, no myalgias, no difficulty walking, normal mobility  Skin: no rash, no itching, no skin infections, no pressure sores or ulcerations  Psych: no anxiety, no depression, no nervousness, no unusual recent stress  Eyes: no blurry vision, no floaters, no recent vision changes, does not wear glasses or contacts  ENT: no hearing loss, no loose or painful teeth, no dentures, last saw dentist 2019  Hematologic: no easy bruising, no abnormal bleeding, no clotting disorder, no frequent epistaxis  Endocrine: no diabetes, does not check CBG's at home   Physical Exam:   BP 138/83 (BP Location: Right Arm, Patient Position: Sitting, Cuff Size: Normal)  Pulse (!) 108  Temp 98.1 F (36.7 C)  Resp 16  Ht 5\' 6"  (1.676 m)  Wt 145 lb (65.8 kg)  SpO2 97% Comment: RA  BMI 23.40 kg/m  General: Fit and well-appearing  HEENT: Unremarkable, NCAT, PERLA, EOMI, teeth in good condition  Neck: no JVD, no bruits, no adenopathy or thyromegaly  Chest: clear to auscultation, symmetrical breath sounds, no wheezes, no rhonchi  CV: RRR, grade III/VI crescendo/decrescendo murmur heard best at RSB, no diastolic murmur  Abdomen: soft, non-tender, no masses  Extremities: warm, well-perfused, pulses palpable in feet, no LE edema  Rectal/GU Deferred  Neuro: Grossly non-focal and symmetrical throughout  Skin: Clean and dry, no rashes, no breakdown    Diagnostic Tests:   Patient Name: [name] Date of Exam: 08/14/2019  Medical Rec #: [id number] Height: 66.0 in  Accession #: [id number] Weight: 154.0 lb  Date of Birth: 1956/02/26 BSA:  1.79 m  Patient Age: 12 years BP: 136/74 mmHg  Patient Gender: F HR: 92 bpm.  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  History: Patient has prior history of Echocardiogram examinations, most  recent 12/27/2018. History of kidney Cancer  Signs/Symptoms:Palpitations Risk Factors:Former Smoker. Aorta  4.0 cm by CT 6/20.  Sonographer: Wilford Sports Rodgers-Jones RDCS  Referring Phys: [place]  1. The aortic valve is abnormal Aortic valve regurgitation is moderate by color flow Doppler. Severe aortic valve stenosis. Vmax 5.2 m/s, MG 77 mmHg, AVA 0.76 cm2, DI 0.23. Compared to prior TTE 12/27/18, there is a significant increase in AV gradients,  though max gradients were obtained from right parasternal view and did not have a good right parasternal view on prior echo  2. Left ventricular ejection fraction, by visual estimation, is 60 to 65%. The left ventricle has normal function. Normal left ventricular size. There is mildly increased left ventricular hypertrophy.  3. Elevated mean left atrial pressure.  4. Left ventricular diastolic Doppler parameters are consistent with pseudonormalization pattern of LV diastolic filling.  5. Global right ventricle has normal systolic function.The right ventricular size is normal. No increase in right ventricular wall thickness.  6. Left atrial size was mildly dilated.  7. Right atrial size was normal.  8. Trivial pericardial effusion is present.  9. The mitral valve is normal in structure. Trace mitral valve regurgitation.  10. The tricuspid valve is normal in structure. Tricuspid valve regurgitation was not visualized by color flow Doppler.  11. The pulmonic valve was not well visualized. Pulmonic  valve regurgitation is not visualized by color flow Doppler.  12. There is mild dilatation of  the ascending aorta measuring 39 mm.  13. The inferior vena cava is normal in size with greater than 50% respiratory variability, suggesting right atrial pressure of 3 mmHg.  FINDINGS  Left Ventricle: Left ventricular ejection fraction, by visual estimation, is 60 to 65%. The left ventricle has normal function. There is mildly increased left ventricular hypertrophy. Concentric left ventricular hypertrophy. Normal left ventricular  size. Spectral Doppler shows Left ventricular diastolic Doppler parameters are consistent with pseudonormalization pattern of LV diastolic filling. Elevated mean left atrial pressure.  Right Ventricle: The right ventricular size is normal. No increase in right ventricular wall thickness. Global RV systolic function is has normal systolic function.  Left Atrium: Left atrial size was mildly dilated.  Right Atrium: Right atrial size was normal in size  Pericardium: Trivial pericardial effusion is present.  Mitral Valve: The mitral valve is normal in structure. Trace mitral valve regurgitation.  Tricuspid Valve: The tricuspid valve is normal in structure. Tricuspid valve regurgitation was not visualized by color flow Doppler.  Aortic Valve: The aortic valve is abnormal. Aortic valve regurgitation is moderate by color flow Doppler. Aortic regurgitation PHT measures 297 msec. Severe aortic stenosis is present. Aortic valve mean gradient measures 77.0 mmHg. Aortic valve peak  gradient measures 108.6 mmHg. Aortic valve area, by VTI measures 0.76 cm. Valve is present in the aortic position. Procedure Date: 04/16/19. The peak aortic velocity was obtained from the right parasternal view.  Pulmonic Valve: The pulmonic valve was not well visualized. Pulmonic valve regurgitation is not visualized by color flow Doppler.  Aorta: Aortic dilatation noted. There is mild dilatation of the ascending aorta measuring 39 mm.  Venous: The inferior vena cava is normal in size with greater than 50%  respiratory variability, suggesting right atrial pressure of 3 mmHg.  IAS/Shunts: The atrial septum is grossly normal.  LEFT VENTRICLE  PLAX 2D  LVIDd: 4.60 cm Diastology  LVIDs: 2.80 cm LV e' lateral: 4.10 cm/s  LV PW: 1.20 cm LV E/e' lateral: 21.2  LV IVS: 1.00 cm LV e' medial: 7.29 cm/s  LVOT diam: 2.10 cm LV E/e' medial: 11.9  LV SV: 68 ml  LV SV Index: 37.38  LVOT Area: 3.46 cm  RIGHT VENTRICLE  RV Basal diam: 3.80 cm  RV S prime: 20.70 cm/s  TAPSE (M-mode): 2.2 cm  LEFT ATRIUM Index RIGHT ATRIUM Index  LA diam: 4.70 cm 2.63 cm/m RA Area: 13.90 cm  LA Vol (A2C): 64.4 ml 35.99 ml/m RA Volume: 40.40 ml 22.58 ml/m  LA Vol (A4C): 67.4 ml 37.66 ml/m  LA Biplane Vol: 65.8 ml 36.77 ml/m  AORTIC VALVE  AV Area (Vmax): 0.83 cm  AV Area (Vmean): 0.94 cm  AV Area (VTI): 0.76 cm  AV Vmax: 521.00 cm/s  AV Vmean: 352.200 cm/s  AV VTI: 1.240 m  AV Peak Grad: 108.6 mmHg  AV Mean Grad: 77.0 mmHg  LVOT Vmax: 125.50 cm/s  LVOT Vmean: 95.550 cm/s  LVOT VTI: 0.271 m  LVOT/AV VTI ratio: 0.22  AI PHT: 297 msec  AORTA  Ao Root diam: 3.10 cm  Ao Asc diam: 3.90 cm  MV E velocity: 86.90 cm/s 103 cm/s  MV A velocity: 119.00 cm/s 70.3 cm/s SHUNTS  MV E/A ratio: 0.73 1.5 Systemic VTI: 0.27 m  Systemic Diam: 2.10 cm  Oswaldo Milian MD  Electronically signed by Oswaldo Milian MD  Signature Date/Time: 08/14/2019/3:58:02 PM  Panel Physicians Referring Physician Case Authorizing Physician  Belva Crome, MD (Primary)    Procedures  RIGHT/LEFT HEART CATH AND CORONARY ANGIOGRAPHY  Conclusion  Moderate to severe aortic stenosis with calculated aortic valve area 1.07 cm, peak to peak gradient 52 mmHg, mean gradient 32 mmHg, and peak instantaneous gradient 56 mmHg. Cardiac output 6.8 L/min.  Right dominant with normal coronary arteries.  Previously documented normal LV systolic function. LVEDP 2 mmHg. RECOMMENDATIONS:  Heart valve team evaluation and management of  significant aortic stenosis.  Recommendations  Antiplatelet/Anticoag No indication for antiplatelet therapy at this time .  Surgeon Notes    08/22/2019 10:39 AM CV Procedure addendum by Belva Crome, MD  Indications  Nonrheumatic aortic valve stenosis [I35.0 (ICD-10-CM)]  Procedural Details  Technical Details The right radial area was sterilely prepped and draped. Intravenous sedation with Versed and fentanyl was administered. 1% Xylocaine was infiltrated to achieve local analgesia. Using real-time vascular ultrasound, a double wall stick with an angiocath was utilized to obtain intra-arterial access. A VUS image was saved for the permanent record.The modified Seldinger technique was used to place a 75F " Slender" sheath in the right radial artery. Weight based heparin was administered. Coronary angiography was done using 5 F catheters. Right coronary angiography was performed with a JR4. Left ventricular hemodymic recordings and pullback across aortic valve was performed using the Judkins right catheter. The valve was crossed using a straight 0.036 wire. Left ventriculography was not performed. Left coronary angiography was performed with a JL 3.5 cm.  Right heart catheterization was performed by exchanging a previously placed antecubital IV angio-cath for a 5 French Slender sheath. 1% Xylocaine was used to locally nesthetize the area around the IV site. The IV catheter was wired using an .018 guidewire. The modified Seldinger technique was used to place the 5 [place] sheath. Double glove technique was used to enhance sterility. After sheath insertion, right heart cath was performed using a 5 French balloon tipped catheter and fluoroscopic guidance. Pressures were recorded in each chamber and in the pulmonary capillary wedge position.. The main pulmonary artery O2 saturation was sampled.   Hemostasis was achieved using a pneumatic band.  During this procedure the patient is administered a total of  Versed 2 mg and Fentanyl 25 mcg to achieve and maintain moderate conscious sedation. The patient's heart rate, blood pressure, and oxygen saturation are monitored continuously during the procedure. The period of conscious sedation is 31 minutes, of which I was present face-to-face 100% of this time. Estimated blood loss <50 mL.   During this procedure medications were administered to achieve and maintain moderate conscious sedation while the patient's heart rate, blood pressure, and oxygen saturation were continuously monitored and I was present face-to-face 100% of this time.  Medications  (Filter: Administrations occurring from 08/22/19 0941 to 08/22/19 1030)          Medication Rate/Dose/Volume Action  Date Time   Heparin (Porcine) in NaCl 1000-0.9 UT/500ML-% SOLN (mL) 500 mL Given 08/22/19 0944   Total dose as of 08/22/19 1030 500 mL Given 0944   1,000 mL        fentaNYL (SUBLIMAZE) injection (mcg) 25 mcg Given 08/22/19 0950   Total dose as of 08/22/19 1030        25 mcg        midazolam (VERSED) injection (mg) 1 mg Given 08/22/19 0950   Total dose as of 08/22/19 1030 1 mg Given 0956   2 mg  lidocaine (PF) (XYLOCAINE) 1 % injection (mL) 2 mL Given 08/22/19 0956   Total dose as of 08/22/19 1030 2 mL Given 1000   4 mL        Radial Cocktail/Verapamil only (mL) 10 mL Given 08/22/19 1001   Total dose as of 08/22/19 1030        10 mL        heparin injection (Units) 3,500 Units Given 08/22/19 1009   Total dose as of 08/22/19 1030        3,500 Units        iohexol (OMNIPAQUE) 350 MG/ML injection (mL) 35 mL Given 08/22/19 1024   Total dose as of 08/22/19 1030        35 mL        Sedation Time  Sedation Time Physician-1: 31 minutes 17 seconds  Contrast  Medication Name Total Dose  iohexol (OMNIPAQUE) 350 MG/ML injection 35 mL  Radiation/Fluoro  Fluoro time: 5.1 (min)  DAP: 5323 (mGycm2)  Cumulative Air Kerma: 90 (mGy)  Coronary Findings  Diagnostic  Dominance: Right  No  diagnostic findings have been documented.  Intervention  No interventions have been documented.  Right Heart  Right Heart Pressures Hemodynamic findings consistent with pulmonary hypertension. LV EDP is normal.  Left Heart  Left Ventricle LV end diastolic pressure is normal.  Aortic Valve There is severe aortic valve stenosis. The aortic valve is calcified. There is restricted aortic valve motion.  Coronary Diagrams  Diagnostic  Dominance: Right   Intervention  Implants     No implant documentation for this case.  Syngo Images  Link to Procedure Log   Show images for CARDIAC CATHETERIZATION Procedure Log  Images on Long Term Storage    Show images for [place], [place]   [facility] Data   Most Recent Value  Fick Cardiac Output 6.81 L/min  Fick Cardiac Output Index 3.9 (L/min)/BSA  Aortic Mean Gradient 37.21 mmHg  Aortic Peak Gradient 52 mmHg  Aortic Valve Area 1.09  Aortic Value Area Index 0.62 cm2/BSA  RA A Wave 2 mmHg  RA V Wave 0 mmHg  RA Mean 0 mmHg  RV Systolic Pressure 19 mmHg  RV Diastolic Pressure 0 mmHg  RV EDP 0 mmHg  PA Systolic Pressure 16 mmHg  PA Diastolic Pressure 6 mmHg  PA Mean 11 mmHg  PW A Wave 5 mmHg  PW V Wave 5 mmHg  PW Mean 4 mmHg  AO Systolic Pressure [id number] mmHg  AO Diastolic Pressure 59 mmHg  AO Mean 80 mmHg  LV Systolic Pressure 123456 mmHg  LV Diastolic Pressure 0 mmHg  LV EDP 2 mmHg  AOp Systolic Pressure 123XX123 mmHg  AOp Diastolic Pressure 59 mmHg  AOp Mean Pressure 81 mmHg  LVp Systolic Pressure 0000000 mmHg  LVp Diastolic Pressure 0 mmHg  LVp EDP Pressure 2 mmHg  QP/QS 1  TPVR Index 2.82 HRUI  TSVR Index 20.53 HRUI  TPVR/TSVR Ratio 0.14    ADDENDUM REPORT: 04/16/2019 14:15  CLINICAL DATA: Aortic stenosis  EXAM:  Cardiac TAVR CT  TECHNIQUE:  The patient was scanned on a Siemens Force [id number] slice scanner. A 120  kV retrospective scan was triggered in the descending thoracic aorta  at 111 HU's. Gantry rotation speed was 270 msecs and  collimation was  .9 mm. No beta blockade or nitro were given. The 3D data set was  reconstructed in 5% intervals of the R-R cycle. Systolic and  diastolic phases were analyzed on a dedicated work  station using  MPR, MIP and VRT modes. The patient received 80 cc of contrast.  FINDINGS:  Aortic Valve: Tri leaflet calcified with restricted leaflet motion  Aorta: Aortic root dilatation mild calcific atherosclerosis Normal  arch vessels  Sinotubular Junction: 29 mm  Ascending Thoracic Aorta: 40 mm  Aortic Arch: 26 mm  Descending Thoracic Aorta: 24 mm  Sinus of Valsalva Measurements:  Non-coronary: 32 mm  Right - coronary: 30 mm  Left - coronary: 31 mm  Coronary Artery Height above Annulus:  Left Main: 13.7 mm above annulus  Right Coronary: 14 mm above annulus  Virtual Basal Annulus Measurements:  Maximum/Minimum Diameter: 24.9 mm x 19.4 mm  Perimeter: 85 mm  Area: 379 mm2  Coronary Arteries: Sufficient height above annulus for deployment  Optimum Fluoroscopic Angle for Delivery: LAO 24 Cranial 18 degrees  IMPRESSION:  1. Tri- leaflet AV with annular area of 379 mm2 suitable for a 23 mm  Sapien 3 valve  2. Essentially normal right dominant coronary arteries 1-24% CAD RAD  1 LM stenosis  3. Mild aortic root dilatation 4.0 cm  4. Coronary arteries sufficient height above annulus for deployment  5. Optimum Angiographic angle for deployment LAO 24 Cranial 18  degrees  Jenkins Rouge  Electronically Signed  By: Jenkins Rouge M.D.  On: 04/16/2019 14:15     CLINICAL DATA: 64 year old female with history of aortic stenosis.  Preprocedural study prior to potential transcatheter aortic valve  replacement (TAVR) procedure.  EXAM:  CT ANGIOGRAPHY CHEST, ABDOMEN AND PELVIS  TECHNIQUE:  Multidetector CT imaging through the chest, abdomen and pelvis was  performed using the standard protocol during bolus administration of  intravenous contrast. Multiplanar reconstructed images and MIPs were    obtained and reviewed to evaluate the vascular anatomy.  CONTRAST: 136mL OMNIPAQUE IOHEXOL 350 MG/ML SOLN  COMPARISON: Chest CT 08/25/2017.  FINDINGS:  CTA CHEST FINDINGS  Cardiovascular: Heart size is borderline enlarged. There is no  significant pericardial fluid, thickening or pericardial  calcification. There is aortic atherosclerosis, as well as  atherosclerosis of the great vessels of the mediastinum and the  coronary arteries, including calcified atherosclerotic plaque in the  left main coronary artery. Severe thickening calcification of the  aortic valve.  Mediastinum/Lymph Nodes: No pathologically enlarged mediastinal or  hilar lymph nodes. Esophagus is unremarkable in appearance. No  axillary lymphadenopathy.  Lungs/Pleura: Several small pulmonary nodules are noted throughout  the lungs bilaterally, generally 4 mm or less in size. The largest  pulmonary nodule is a ground-glass attenuation nodule in the  superior segment of the left lower lobe (axial image 39 of series  16) measuring 12 x 10 mm. No acute consolidative airspace disease.  No pleural effusions.  Musculoskeletal/Soft Tissues: There are no aggressive appearing  lytic or blastic lesions noted in the visualized portions of the  skeleton.  CTA ABDOMEN AND PELVIS FINDINGS  Hepatobiliary: No suspicious cystic or solid hepatic lesions. No  intra or extrahepatic biliary ductal dilatation. Gallbladder is  normal in appearance.  Pancreas: No pancreatic mass. No pancreatic ductal dilatation. No  pancreatic or peripancreatic fluid or inflammatory changes.  Spleen: Unremarkable.  Adrenals/Urinary Tract: Small cortical calcification in the anterior  aspect of the interpolar region of the left kidney is stable dating  back to 2011, presumably benign. Right kidney and bilateral adrenal  glands are normal in appearance. No hydroureteronephrosis. Urinary  bladder is normal in appearance.  Stomach/Bowel: Normal appearance of  the stomach. No pathologic  dilatation of small bowel or  colon. A few scattered colonic  diverticulae are noted, without surrounding inflammatory changes to  suggest an acute diverticulitis at this time. The appendix is not  confidently identified and may be surgically absent. Regardless,  there are no inflammatory changes noted adjacent to the cecum to  suggest the presence of an acute appendicitis at this time.  Vascular/Lymphatic: Aortic atherosclerosis, with vascular findings  and measurements pertinent to potential TAVR procedure, as detailed  below. No aneurysm or dissection noted in the abdominal or pelvic  vasculature. No lymphadenopathy noted in the abdomen or pelvis.  Reproductive: Uterus and ovaries are unremarkable in appearance.  Other: No significant volume of ascites. No pneumoperitoneum.  Musculoskeletal: There are no aggressive appearing lytic or blastic  lesions noted in the visualized portions of the skeleton.  VASCULAR MEASUREMENTS PERTINENT TO TAVR:  AORTA:  Minimal Aortic Diameter-15 x 14 mm  Severity of Aortic Calcification-moderate  RIGHT PELVIS:  Right Common Iliac Artery -  Minimal Diameter-8.7 x 8.8 mm  Tortuosity-mild  Calcification-moderate  Right External Iliac Artery -  Minimal Diameter-7.0 x 7.9 mm  Tortuosity - mild  Calcification-none  Right Common Femoral Artery -  Minimal Diameter-8.1 x 8.5 mm  Tortuosity - mild  Calcification-none  LEFT PELVIS:  Left Common Iliac Artery -  Minimal Diameter-8.7 x 7.9 mm  Tortuosity - mild  Calcification-moderate  Left External Iliac Artery -  Minimal Diameter-7.1 x 7.0 mm  Tortuosity - mild  Calcification-none  Left Common Femoral Artery -  Minimal Diameter-7.7 x 6.8 mm  Tortuosity - mild  Calcification-none  Review of the MIP images confirms the above findings.  IMPRESSION:  1. Vascular findings and measurements pertinent to potential TAVR  procedure, as detailed above.  2. Severe thickening and  calcification of the aortic valve,  compatible with reported clinical history of severe aortic stenosis.  3. Aortic atherosclerosis, in addition to left main coronary artery  disease.  4. Multiple small pulmonary nodules in the lungs bilaterally,  largest of which is a ground-glass attenuation nodule in the  superior segment of the left lower lobe measuring 12 x 10 mm.  Initial follow-up with CT at 6-12 months is recommended to confirm  persistence. If persistent, repeat CT is recommended every 2 years  until 5 years of stability has been established. This recommendation  follows the consensus statement: Guidelines for Management of  Incidental Pulmonary Nodules Detected on CT Images: From the  Fleischner Society 2017; Radiology 2017; 284:228-243.  5. Colonic diverticulosis without evidence of acute diverticulitis  at this time.  6. Additional incidental findings, as above.  Electronically Signed  By: Vinnie Langton M.D.  On: 04/16/2019 13:22    RISK SCORES   Procedure: Isolated AVR  Risk of Mortality:  0.893%  Renal Failure:  0.333%  Permanent Stroke:  0.817%  Prolonged Ventilation:  2.664%  DSW Infection:  0.030%  Reoperation:  2.712%  Morbidity or Mortality:  5.163%  Short Length of Stay:  64.648%  Long Length of Stay:  1.414%    Impression:   This 64 year old woman has stage C, asymptomatic, severe aortic stenosis and moderate aortic insufficiency and is New [place] Heart Association class I. She remains quite active walking 10 to 11 miles per day and denies any hint of symptoms. I have personally reviewed her 2D echocardiogram, cardiac catheterization, and CTA studies. Her 2D echocardiogram shows a trileaflet aortic valve with thickening and calcification of the valve leaflets and markedly restricted valve mobility. The mean gradient across the valve is 77  mmHg consistent with severe aortic stenosis and there is moderate aortic insufficiency with a pressure half-time  of 297 ms. Left ventricular systolic function is normal. Cardiac catheterization showed a mean gradient of 37 mmHg across the aortic valve with a peak gradient of 52 mmHg and a calculated aortic valve area of 1.09 consistent with severe aortic stenosis. There is no coronary disease. I agree that given the appearance of her aortic valve and the high gradient in combination with moderate aortic insufficiency it would be best to proceed with aortic valve replacement to prevent sudden decompensation and deterioration of left ventricular function.   The patient was counseled at length regarding treatment alternatives for management of severe aortic stenosis. The risks and benefits of surgical intervention has been discussed in detail. Long-term prognosis with medical therapy was discussed. Alternative approaches such as conventional surgical aortic valve replacement, transcatheter aortic valve replacement, and palliative medical therapy were compared and contrasted at length. This discussion was placed in the context of the patient's own specific clinical presentation and past medical history. All of their questions been addressed. She said that she would definitely rather undergo TAVR then open surgical aortic valve replacement since she is very busy at work and would like to continue to do so and care of 2 grandchildren at home. Her surgical risk is low but I think TAVR is a reasonable alternative at her age. I discussed the limited long-term data on transcatheter valve durability and the possibility that she may have structural valve deterioration requiring reintervention in the future. She said that she understands this but feels that TAVR is the best option for her at this time. Her gated cardiac CTA shows anatomy suitable for transcatheter aortic valve replacement using a Sapien 3 valve. Her abdominal and pelvic CTA shows adequate pelvic vascular anatomy to allow transfemoral insertion. Her chest CTA shows  several small pulmonary nodules throughout the lungs bilaterally that are 4 mm or less in size. The largest is a groundglass attenuation nodule in the superior segment of left lower lobe measuring 12 x 10 mm. She said that these have been followed by Dr. Baltazar Apo of pulmonary medicine and have generally been stable. These will require continued follow-up given her age and smoking history.   Following the decision to proceed with transcatheter aortic valve replacement, a discussion was held regarding what types of management strategies would be attempted intraoperatively in the event of life-threatening complications, including whether or not the patient would be considered a candidate for the use of cardiopulmonary bypass and/or conversion to open sternotomy for attempted surgical intervention. She is a low risk surgical patient and would be a candidate for emergent sternotomy to manage any intraoperative complications.   The patient has been advised of a variety of complications that might develop including but not limited to risks of death, stroke, paravalvular leak, aortic dissection or other major vascular complications, aortic annulus rupture, device embolization, cardiac rupture or perforation, mitral regurgitation, acute myocardial infarction, arrhythmia, heart block or bradycardia requiring permanent pacemaker placement, congestive heart failure, respiratory failure, renal failure, pneumonia, infection, other late complications related to structural valve deterioration or migration, or other complications that might ultimately cause a temporary or permanent loss of functional independence or other long term morbidity. The patient provides full informed consent for the procedure as described and all questions were answered.   Plan:   Transfemoral TAVR using a Sapien 3 valve.   [name], MD

## 2019-11-12 NOTE — Progress Notes (Addendum)
Anesthesia Chart Review:  Case: [id number] Date/Time: 11/13/19 1300   Procedures:      TRANSCATHETER AORTIC VALVE REPLACEMENT, TRANSFEMORAL (N/A Chest)     TRANSESOPHAGEAL ECHOCARDIOGRAM (TEE) (N/A )   Anesthesia type: General   Pre-op diagnosis: Severe Aortic Stenosis   Location: [facility] / [facility]   Providers: Sherren Mocha, MD    CT Surgeon: Gilford Raid, MD   DISCUSSION: Patient is a 64 year old female scheduled for the above procedure.  History includes former smoker (quit 2007), severe AS/moderate AR, HTN, GERD, renal cancer (s/p right nephrectomy 2007), diverticulitis (s/p sigmoid colectomy 11/24/09; s/p incisional hernia repair 03/19/11), perforated appendix (s/p appendectomy, right salpingo-oophorectomy 05/21/03; s/p incisional hernia repair 06/07/05), pulmonary nodules (2 year follow-up recommended by pulmonologist Dr. Lamonte Sakai following 03/2019 CT).  Presurgical COVID-19 test negative.  Anesthesia team to evaluate on the day of surgery.   VS: BP (!) 143/77   Pulse 85   Temp 36.9 C (Oral)   Resp 18   Ht 5\' 6"  (1.676 m)   Wt 65.1 kg   SpO2 98%   BMI 23.18 kg/m   PROVIDERS: Leighton Ruff, MD is PCP  - Jenkins Rouge, MD is cardiologist  - Baltazar Apo, MD is pulmonologist. Seen for pulmonary nodules on 11/04/17. Per 04/17/19 telephone encounter, "Please let the patient know that I reviewed her CT scan of the chest that was done 04/16/2019 and that it was adequate to give [place] a good comparison to her previous scan from 2018.  She does not need to go have an additional scan for [place] as planned-we can cancel it  Please let her know that the pulmonary nodules that we have been following her all for the most part stable, most are actually smaller than 2018.  Guidelines indicate that she should have another repeat scan in 2 years to ensure that they remain stable."   LABS: Labs reviewed: Acceptable for surgery. (all labs ordered are listed, but only abnormal results  are displayed)  Labs Reviewed  BLOOD GAS, ARTERIAL - Abnormal; Notable for the following components:      Result Value   Acid-Base Excess 3.7 (*)    All other components within normal limits  URINALYSIS, ROUTINE W REFLEX MICROSCOPIC - Abnormal; Notable for the following components:   Color, Urine STRAW (*)    All other components within normal limits  SURGICAL PCR SCREEN  APTT  BRAIN NATRIURETIC PEPTIDE  CBC  COMPREHENSIVE METABOLIC PANEL  HEMOGLOBIN A1C  PROTIME-INR  TYPE AND SCREEN  ABO/RH     IMAGES: CXR 11/09/19: IMPRESSION: COPD changes without acute abnormality.  CTA chest/abd/pelvis 04/16/19: IMPRESSION: 1. Vascular findings and measurements pertinent to potential TAVR procedure, as detailed above. 2. Severe thickening and calcification of the aortic valve, compatible with reported clinical history of severe aortic stenosis. 3. Aortic atherosclerosis, in addition to left main coronary artery disease. 4. Multiple small pulmonary nodules in the lungs bilaterally, largest of which is a ground-glass attenuation nodule in the superior segment of the left lower lobe measuring 12 x 10 mm. Initial follow-up with CT at 6-12 months is recommended to confirm persistence. If persistent, repeat CT is recommended every 2 years until 5 years of stability has been established. This recommendation follows the consensus statement: Guidelines for Management of Incidental Pulmonary Nodules Detected on CT Images: From the Fleischner Society 2017; Radiology 2017; 284:228-243. 5. Colonic diverticulosis without evidence of acute diverticulitis at this time. 6. Additional incidental findings, see full report (  Results Review tab).   EKG: 11/09/19: Normal sinus rhythm Possible Left atrial enlargement Borderline ECG No significant change since last tracing Confirmed by Mertie Moores ([id number]) on 11/10/2019 6:42:46 AM   CV: Carotid US 11/09/19: Summary: Right Carotid: Velocities in  the right ICA are consistent with a 1-39% stenosis. Left Carotid: Velocities in the left ICA are consistent with a 1-39% stenosis. Vertebrals: Bilateral vertebral arteries demonstrate antegrade flow.  Cardiac cath 08/22/19:  Moderate to severe aortic stenosis with calculated aortic valve area 1.07 cm, peak to peak gradient 52 mmHg, mean gradient 32 mmHg, and peak instantaneous gradient 56 mmHg.  Cardiac output 6.8 L/min.  Right dominant with normal coronary arteries.  Previously documented normal LV systolic function.  LVEDP 2 mmHg. RECOMMENDATIONS:  Heart valve team evaluation and management of significant aortic stenosis.  Echo 08/14/19: IMPRESSIONS  1. The aortic valve is abnormal Aortic valve regurgitation is moderate by color flow Doppler. Severe aortic valve stenosis. Vmax 5.2 m/s, MG 77 mmHg, AVA 0.76 cm2, DI 0.23. Compared to prior TTE 12/27/18, there is a significant increase in AV gradients,  though max gradients were obtained from right parasternal view and did not have a good right parasternal view on prior echo  2. Left ventricular ejection fraction, by visual estimation, is 60 to 65%. The left ventricle has normal function. Normal left ventricular size. There is mildly increased left ventricular hypertrophy.  3. Elevated mean left atrial pressure.  4. Left ventricular diastolic Doppler parameters are consistent with pseudonormalization pattern of LV diastolic filling.  5. Global right ventricle has normal systolic function.The right ventricular size is normal. No increase in right ventricular wall thickness.  6. Left atrial size was mildly dilated.  7. Right atrial size was normal.  8. Trivial pericardial effusion is present.  9. The mitral valve is normal in structure. Trace mitral valve regurgitation. 10. The tricuspid valve is normal in structure. Tricuspid valve regurgitation was not visualized by color flow Doppler. 11. The pulmonic valve was not well visualized.  Pulmonic valve regurgitation is not visualized by color flow Doppler. 12. There is mild dilatation of the ascending aorta measuring 39 mm. 13. The inferior vena cava is normal in size with greater than 50% respiratory variability, suggesting right atrial pressure of 3 mmHg.  CT coronary 04/16/19: IMPRESSION: 1. Tri- leaflet AV with annular area of 379 mm2 suitable for a 23 mm Sapien 3 valve 2. Essentially normal right dominant coronary arteries 1-24% CAD RAD 1 LM stenosis 3.  Mild aortic root dilatation 4.0 cm 4.  Coronary arteries sufficient height above annulus for deployment 5. Optimum Angiographic angle for deployment LAO 24 Cranial 18 degrees   Past Medical History:  Diagnosis Date  . Diverticulitis, colon   . GERD (gastroesophageal reflux disease)   . Heart murmur   . Hypertension   . Pulmonary nodules    seen on pre TAVR CT, needs follow up   . Renal cancer [facility]) 2007   Surgically removed from right kidney    Past Surgical History:  Procedure Laterality Date  . APPENDECTOMY    . COLON SURGERY    . HERNIA REPAIR    . OVARIAN CYST SURGERY    . RIGHT/LEFT HEART CATH AND CORONARY ANGIOGRAPHY N/A 08/22/2019   Procedure: RIGHT/LEFT HEART CATH AND CORONARY ANGIOGRAPHY;  Surgeon: Belva Crome, MD;  Location: [place];  Service: Cardiovascular;  Laterality: N/A;    MEDICATIONS: . amLODipine (NORVASC) 5 MG tablet  . fluticasone (FLONASE) 50 MCG/ACT  nasal spray  . Lidocaine 4 % PTCH  . naproxen sodium (ALEVE) 220 MG tablet  . omeprazole (PRILOSEC) 40 MG capsule  . tetrahydrozoline-zinc (VISINE-AC) 0.05-0.25 % ophthalmic solution   No current facility-administered medications for this encounter.   Derrill Memo ON 11/13/2019] cefUROXime (ZINACEF) 1.5 g in sodium chloride 0.9 % 100 mL IVPB  . [START ON 11/13/2019] dexmedetomidine (PRECEDEX) 400 MCG/100ML (4 mcg/mL) infusion  . [START ON 11/13/2019] heparin 30,000 units/NS 1000 mL solution for CELLSAVER  . [START ON  11/13/2019] magnesium sulfate (IV Push/IM) injection 40 mEq  . [START ON 11/13/2019] norepinephrine (LEVOPHED) 4mg  in 235mL premix infusion  . [START ON 11/13/2019] potassium chloride injection 80 mEq  . [START ON 11/13/2019] vancomycin (VANCOREADY) IVPB 1250 mg/250 mL    Myra Gianotti, PA-C Surgical Short Stay/Anesthesiology [facility] Phone [phone] [facility] Phone [phone] 11/12/2019 11:04 AM

## 2019-11-12 NOTE — Anesthesia Preprocedure Evaluation (Addendum)
Anesthesia Evaluation  Patient identified by MRN, date of birth, ID band Patient awake    Reviewed: Allergy & Precautions, NPO status , Patient's Chart, lab work & pertinent test results  Airway Mallampati: I  TM Distance: >3 FB Neck ROM: Full    Dental  (+) Dental Advisory Given   Pulmonary former smoker,    breath sounds clear to auscultation       Cardiovascular hypertension, Pt. on medications + Valvular Problems/Murmurs AS  Rhythm:Regular Rate:Normal + Systolic murmurs    Neuro/Psych negative neurological ROS  negative psych ROS   GI/Hepatic Neg liver ROS, GERD  Medicated,  Endo/Other  negative endocrine ROS  Renal/GU      Musculoskeletal negative musculoskeletal ROS (+)   Abdominal Normal abdominal exam  (+)   Peds  Hematology negative hematology ROS (+)   Anesthesia Other Findings   Reproductive/Obstetrics                             Echo:  1. The aortic valve is abnormal Aortic valve regurgitation is moderate by color flow Doppler. Severe aortic valve stenosis. Vmax 5.2 m/s, MG 77 mmHg, AVA 0.76 cm2, DI 0.23. Compared to prior TTE 12/27/18, there is a significant increase in AV gradients,  though max gradients were obtained from right parasternal view and did not have a good right parasternal view on prior echo  2. Left ventricular ejection fraction, by visual estimation, is 60 to 65%. The left ventricle has normal function. Normal left ventricular size. There is mildly increased left ventricular hypertrophy.  3. Elevated mean left atrial pressure.  4. Left ventricular diastolic Doppler parameters are consistent with pseudonormalization pattern of LV diastolic filling.  5. Global right ventricle has normal systolic function.The right ventricular size is normal. No increase in right ventricular wall thickness.  6. Left atrial size was mildly dilated.  7. Right atrial size was normal.  8. Trivial pericardial effusion is present.  9. The mitral valve is normal in structure. Trace mitral valve regurgitation. 10. The tricuspid valve is normal in structure. Tricuspid valve regurgitation was not visualized by color flow Doppler. 11. The pulmonic valve was not well visualized. Pulmonic valve regurgitation is not visualized by color flow Doppler. 12. There is mild dilatation of the ascending aorta measuring 39 mm. 13. The inferior vena cava is normal in size with greater than 50% respiratory variability, suggesting right atrial pressure of 3 mmHg.  Anesthesia Physical Anesthesia Plan  ASA: IV  Anesthesia Plan: MAC   Post-op Pain Management:    Induction: Intravenous  PONV Risk Score and Plan: 2 and Ondansetron and Propofol infusion  Airway Management Planned: Natural Airway and Simple Face Mask  Additional Equipment: None  Intra-op Plan:   Post-operative Plan:   Informed Consent: I have reviewed the patients History and Physical, chart, labs and discussed the procedure including the risks, benefits and alternatives for the proposed anesthesia with the patient or authorized representative who has indicated his/her understanding and acceptance.       Plan Discussed with: CRNA  Anesthesia Plan Comments: (PAT note written 11/12/2019 by Myra Gianotti, PA-C. )       Anesthesia Quick Evaluation

## 2019-11-13 ENCOUNTER — Inpatient Hospital Stay (HOSPITAL_COMMUNITY)
Admission: RE | Admit: 2019-11-13 | Discharge: 2019-11-14 | DRG: 267 | Disposition: A | Discharge status: Home / Self Care | Payer: Federal, State, Local not specified - PPO | Source: Ambulatory Visit | Attending: Surgery | Admitting: Surgery

## 2019-11-13 ENCOUNTER — Other Ambulatory Visit: Payer: Self-pay

## 2019-11-13 ENCOUNTER — Other Ambulatory Visit: Payer: Self-pay | Admitting: Physician Assistant

## 2019-11-13 ENCOUNTER — Encounter (HOSPITAL_COMMUNITY): Payer: Self-pay | Admitting: Cardiovascular Disease

## 2019-11-13 ENCOUNTER — Encounter (HOSPITAL_COMMUNITY)
Admission: RE | Disposition: A | Discharge status: Home / Self Care | Payer: Self-pay | Source: Ambulatory Visit | Attending: Surgery

## 2019-11-13 ENCOUNTER — Inpatient Hospital Stay (HOSPITAL_COMMUNITY): Payer: Federal, State, Local not specified - PPO | Admitting: Certified Registered Nurse Anesthetist

## 2019-11-13 ENCOUNTER — Inpatient Hospital Stay (HOSPITAL_COMMUNITY): Payer: Federal, State, Local not specified - PPO

## 2019-11-13 ENCOUNTER — Encounter: Payer: Self-pay | Admitting: Physician Assistant

## 2019-11-13 ENCOUNTER — Inpatient Hospital Stay (HOSPITAL_COMMUNITY)
Admission: RE | Admit: 2019-11-13 | Discharge: 2019-11-13 | Disposition: A | Discharge status: Home / Self Care | Payer: Federal, State, Local not specified - PPO | Source: Ambulatory Visit | Attending: Cardiovascular Disease | Admitting: Cardiovascular Disease

## 2019-11-13 ENCOUNTER — Inpatient Hospital Stay (HOSPITAL_COMMUNITY): Payer: Federal, State, Local not specified - PPO | Admitting: Vascular Surgery

## 2019-11-13 DIAGNOSIS — Z885 Allergy status to narcotic agent status: Secondary | ICD-10-CM

## 2019-11-13 DIAGNOSIS — Z952 Presence of prosthetic heart valve: Secondary | ICD-10-CM | POA: Diagnosis not present

## 2019-11-13 DIAGNOSIS — C649 Malignant neoplasm of unspecified kidney, except renal pelvis: Secondary | ICD-10-CM | POA: Diagnosis present

## 2019-11-13 DIAGNOSIS — Z888 Allergy status to other drugs, medicaments and biological substances status: Secondary | ICD-10-CM | POA: Diagnosis not present

## 2019-11-13 DIAGNOSIS — Z809 Family history of malignant neoplasm, unspecified: Secondary | ICD-10-CM

## 2019-11-13 DIAGNOSIS — I350 Nonrheumatic aortic (valve) stenosis: Secondary | ICD-10-CM

## 2019-11-13 DIAGNOSIS — I10 Essential (primary) hypertension: Secondary | ICD-10-CM | POA: Diagnosis present

## 2019-11-13 DIAGNOSIS — K5792 Diverticulitis of intestine, part unspecified, without perforation or abscess without bleeding: Secondary | ICD-10-CM | POA: Diagnosis not present

## 2019-11-13 DIAGNOSIS — N189 Chronic kidney disease, unspecified: Secondary | ICD-10-CM | POA: Diagnosis present

## 2019-11-13 DIAGNOSIS — Z79899 Other long term (current) drug therapy: Secondary | ICD-10-CM

## 2019-11-13 DIAGNOSIS — I352 Nonrheumatic aortic (valve) stenosis with insufficiency: Principal | ICD-10-CM | POA: Diagnosis present

## 2019-11-13 DIAGNOSIS — K219 Gastro-esophageal reflux disease without esophagitis: Secondary | ICD-10-CM | POA: Diagnosis present

## 2019-11-13 DIAGNOSIS — I700 Atherosclerosis of aorta: Secondary | ICD-10-CM | POA: Diagnosis present

## 2019-11-13 DIAGNOSIS — I129 Hypertensive chronic kidney disease with stage 1 through stage 4 chronic kidney disease, or unspecified chronic kidney disease: Secondary | ICD-10-CM | POA: Diagnosis not present

## 2019-11-13 DIAGNOSIS — Z85528 Personal history of other malignant neoplasm of kidney: Secondary | ICD-10-CM

## 2019-11-13 DIAGNOSIS — R918 Other nonspecific abnormal finding of lung field: Secondary | ICD-10-CM | POA: Diagnosis present

## 2019-11-13 DIAGNOSIS — Z006 Encounter for examination for normal comparison and control in clinical research program: Secondary | ICD-10-CM | POA: Diagnosis not present

## 2019-11-13 DIAGNOSIS — Z8249 Family history of ischemic heart disease and other diseases of the circulatory system: Secondary | ICD-10-CM

## 2019-11-13 HISTORY — PX: TEE WITHOUT CARDIOVERSION: SHX5443

## 2019-11-13 HISTORY — PX: TRANSCATHETER AORTIC VALVE REPLACEMENT, TRANSFEMORAL: SHX6400

## 2019-11-13 HISTORY — DX: Presence of prosthetic heart valve: Z95.2

## 2019-11-13 HISTORY — DX: Nonrheumatic aortic (valve) stenosis: I35.0

## 2019-11-13 LAB — POCT I-STAT, CHEM 8
BUN: 17 mg/dL (ref 8–23)
BUN: 17 mg/dL (ref 8–23)
Calcium, Ion: 1.25 mmol/L (ref 1.15–1.40)
Calcium, Ion: 1.25 mmol/L (ref 1.15–1.40)
Chloride: 99 mmol/L (ref 98–111)
Chloride: 100 mmol/L (ref 98–111)
Creatinine, Ser: 0.4 mg/dL — ABNORMAL LOW (ref 0.44–1.00)
Creatinine, Ser: 0.4 mg/dL — ABNORMAL LOW (ref 0.44–1.00)
Glucose, Bld: 104 mg/dL — ABNORMAL HIGH (ref 70–99)
Glucose, Bld: 107 mg/dL — ABNORMAL HIGH (ref 70–99)
HCT: 36 % (ref 36.0–46.0)
HCT: 37 % (ref 36.0–46.0)
Hemoglobin: 12.2 g/dL (ref 12.0–15.0)
Hemoglobin: 12.6 g/dL (ref 12.0–15.0)
Potassium: 3.7 mmol/L (ref 3.5–5.1)
Potassium: 3.7 mmol/L (ref 3.5–5.1)
Sodium: 139 mmol/L (ref 135–145)
Sodium: 139 mmol/L (ref 135–145)
TCO2: 31 mmol/L (ref 22–32)
TCO2: 28 mmol/L (ref 22–32)

## 2019-11-13 LAB — POCT ACTIVATED CLOTTING TIME
Activated Clotting Time: 125 seconds
Activated Clotting Time: 114 seconds
Activated Clotting Time: 241 seconds

## 2019-11-13 SURGERY — IMPLANTATION, AORTIC VALVE, TRANSCATHETER, FEMORAL APPROACH
Anesthesia: Monitor Anesthesia Care | Site: Chest

## 2019-11-13 MED ORDER — VANCOMYCIN HCL IN DEXTROSE 1-5 GM/200ML-% IV SOLN
1000.0000 mg | Freq: Once | INTRAVENOUS | Status: AC
  Administered 2019-11-13: 1000 mg via INTRAVENOUS
  Filled 2019-11-13: qty 200

## 2019-11-13 MED ORDER — IOHEXOL 350 MG/ML SOLN
INTRAVENOUS | Status: AC
  Filled 2019-11-13: qty 1

## 2019-11-13 MED ORDER — PANTOPRAZOLE SODIUM 40 MG PO TBEC
40.0000 mg | DELAYED_RELEASE_TABLET | Freq: Every day | ORAL | Status: DC
  Administered 2019-11-14: 40 mg via ORAL
  Filled 2019-11-13: qty 1

## 2019-11-13 MED ORDER — FENTANYL CITRATE (PF) 100 MCG/2ML IJ SOLN
INTRAMUSCULAR | Status: DC | PRN
  Administered 2019-11-13: 100 ug via INTRAVENOUS

## 2019-11-13 MED ORDER — CHLORHEXIDINE GLUCONATE 0.12 % MT SOLN
OROMUCOSAL | Status: AC
  Filled 2019-11-13: qty 15

## 2019-11-13 MED ORDER — LIDOCAINE HCL (PF) 1 % IJ SOLN
INTRAMUSCULAR | Status: DC | PRN
  Administered 2019-11-13 (×2): 10 mL

## 2019-11-13 MED ORDER — HEPARIN SODIUM (PORCINE) 1000 UNIT/ML IJ SOLN
INTRAMUSCULAR | Status: AC
  Filled 2019-11-13: qty 2

## 2019-11-13 MED ORDER — ACETAMINOPHEN 325 MG PO TABS
650.0000 mg | ORAL_TABLET | Freq: Four times a day (QID) | ORAL | Status: DC | PRN
  Administered 2019-11-13 – 2019-11-14 (×2): 650 mg via ORAL
  Filled 2019-11-13 (×2): qty 2

## 2019-11-13 MED ORDER — ONDANSETRON HCL 4 MG/2ML IJ SOLN: 4.0000 mg | Freq: Four times a day (QID) | INTRAMUSCULAR | Status: DC | PRN

## 2019-11-13 MED ORDER — SODIUM CHLORIDE 0.9 % IV SOLN
1.5000 g | Freq: Two times a day (BID) | INTRAVENOUS | Status: DC
  Administered 2019-11-13 – 2019-11-14 (×2): 1.5 g via INTRAVENOUS
  Filled 2019-11-13 (×3): qty 1.5

## 2019-11-13 MED ORDER — SODIUM CHLORIDE 0.9 % IV SOLN: INTRAVENOUS | Status: AC

## 2019-11-13 MED ORDER — DEXMEDETOMIDINE HCL IN NACL 400 MCG/100ML IV SOLN
INTRAVENOUS | Status: DC | PRN
  Administered 2019-11-13: 1 ug/kg/h via INTRAVENOUS

## 2019-11-13 MED ORDER — CHLORHEXIDINE GLUCONATE 4 % EX LIQD
30.0000 mL | CUTANEOUS | Status: DC
  Filled 2019-11-13: qty 30

## 2019-11-13 MED ORDER — CLOPIDOGREL BISULFATE 75 MG PO TABS
75.0000 mg | ORAL_TABLET | Freq: Every day | ORAL | Status: DC
  Administered 2019-11-14: 75 mg via ORAL
  Filled 2019-11-13: qty 1

## 2019-11-13 MED ORDER — PROTAMINE SULFATE 10 MG/ML IV SOLN
INTRAVENOUS | Status: DC | PRN
  Administered 2019-11-13: 90 mg via INTRAVENOUS

## 2019-11-13 MED ORDER — CHLORHEXIDINE GLUCONATE 4 % EX LIQD: 60.0000 mL | Freq: Once | CUTANEOUS | Status: DC

## 2019-11-13 MED ORDER — LACTATED RINGERS IV SOLN: INTRAVENOUS | Status: DC | PRN

## 2019-11-13 MED ORDER — PHENYLEPHRINE HCL-NACL 20-0.9 MG/250ML-% IV SOLN
0.0000 ug/min | INTRAVENOUS | Status: DC
  Filled 2019-11-13: qty 250

## 2019-11-13 MED ORDER — SODIUM CHLORIDE 0.9% FLUSH
3.0000 mL | Freq: Two times a day (BID) | INTRAVENOUS | Status: DC
  Administered 2019-11-13 – 2019-11-14 (×2): 3 mL via INTRAVENOUS

## 2019-11-13 MED ORDER — LIDOCAINE HCL (PF) 1 % IJ SOLN
INTRAMUSCULAR | Status: AC
  Filled 2019-11-13: qty 30

## 2019-11-13 MED ORDER — IOHEXOL 350 MG/ML SOLN
INTRAVENOUS | Status: DC | PRN
  Administered 2019-11-13: 40 mL via INTRA_ARTERIAL

## 2019-11-13 MED ORDER — CHLORHEXIDINE GLUCONATE 0.12 % MT SOLN
15.0000 mL | Freq: Once | OROMUCOSAL | Status: AC
  Administered 2019-11-13: 15 mL via OROMUCOSAL
  Filled 2019-11-13: qty 15

## 2019-11-13 MED ORDER — PROPOFOL 500 MG/50ML IV EMUL
INTRAVENOUS | Status: DC | PRN
  Administered 2019-11-13: 30 ug/kg/min via INTRAVENOUS

## 2019-11-13 MED ORDER — MIDAZOLAM HCL 5 MG/5ML IJ SOLN
INTRAMUSCULAR | Status: DC | PRN
  Administered 2019-11-13: 2 mg via INTRAVENOUS

## 2019-11-13 MED ORDER — NITROGLYCERIN IN D5W 200-5 MCG/ML-% IV SOLN: 0.0000 ug/min | INTRAVENOUS | Status: DC

## 2019-11-13 MED ORDER — HEPARIN (PORCINE) IN NACL 1000-0.9 UT/500ML-% IV SOLN
INTRAVENOUS | Status: AC
  Filled 2019-11-13: qty 1000

## 2019-11-13 MED ORDER — HEPARIN (PORCINE) IN NACL 1000-0.9 UT/500ML-% IV SOLN
INTRAVENOUS | Status: DC | PRN
  Administered 2019-11-13 (×3): 500 mL

## 2019-11-13 MED ORDER — PHENYLEPHRINE HCL (PRESSORS) 10 MG/ML IV SOLN
INTRAVENOUS | Status: DC | PRN
  Administered 2019-11-13 (×2): 40 ug via INTRAVENOUS

## 2019-11-13 MED ORDER — SODIUM CHLORIDE 0.9 % IV SOLN: INTRAVENOUS | Status: DC

## 2019-11-13 MED ORDER — HEPARIN (PORCINE) IN NACL 1000-0.9 UT/500ML-% IV SOLN
INTRAVENOUS | Status: AC
  Filled 2019-11-13: qty 500

## 2019-11-13 MED ORDER — SODIUM CHLORIDE 0.9% FLUSH: 3.0000 mL | INTRAVENOUS | Status: DC | PRN

## 2019-11-13 MED ORDER — PROTAMINE SULFATE 10 MG/ML IV SOLN
INTRAVENOUS | Status: AC
  Filled 2019-11-13: qty 15

## 2019-11-13 MED ORDER — HEPARIN SODIUM (PORCINE) 1000 UNIT/ML IJ SOLN
INTRAMUSCULAR | Status: DC | PRN
  Administered 2019-11-13: 9000 [IU] via INTRAVENOUS

## 2019-11-13 MED ORDER — CHLORHEXIDINE GLUCONATE 4 % EX LIQD
60.0000 mL | Freq: Once | CUTANEOUS | Status: DC
  Filled 2019-11-13: qty 60

## 2019-11-13 MED ORDER — SODIUM CHLORIDE 0.9 % IV SOLN: 250.0000 mL | INTRAVENOUS | Status: DC | PRN

## 2019-11-13 MED ORDER — ACETAMINOPHEN 650 MG RE SUPP: 650.0000 mg | Freq: Four times a day (QID) | RECTAL | Status: DC | PRN

## 2019-11-13 MED ORDER — ASPIRIN 81 MG PO CHEW
81.0000 mg | CHEWABLE_TABLET | Freq: Every day | ORAL | Status: DC
  Administered 2019-11-14: 81 mg via ORAL
  Filled 2019-11-13: qty 1

## 2019-11-13 SURGICAL SUPPLY — 36 items
BAG SNAP BAND KOVER 36X36 (MISCELLANEOUS) ×6 IMPLANT
BLANKET WARM UNDERBOD FULL ACC (MISCELLANEOUS) ×3 IMPLANT
CABLE ADAPT PACING TEMP 12FT (ADAPTER) ×1 IMPLANT
CATH 23 ULTRA DELIVERY (CATHETERS) ×1 IMPLANT
CATH DIAG 6FR PIGTAIL ANGLED (CATHETERS) ×2 IMPLANT
CATH INFINITI 6F AL1 (CATHETERS) ×1 IMPLANT
CATH S G BIP PACING (CATHETERS) ×1 IMPLANT
CLOSURE MYNX CONTROL 6F/7F (Vascular Products) ×1 IMPLANT
CRIMPER (MISCELLANEOUS) ×1 IMPLANT
DEVICE CLOSURE PERCLS PRGLD 6F (VASCULAR PRODUCTS) IMPLANT
DEVICE INFLATION ATRION QL2530 (MISCELLANEOUS) ×1 IMPLANT
ELECT DEFIB PAD ADLT CADENCE (PAD) ×1 IMPLANT
GUIDEWIRE SAFE TJ AMPLATZ EXST (WIRE) ×1 IMPLANT
KIT HEART LEFT (KITS) ×3 IMPLANT
KIT MICROPUNCTURE NIT STIFF (SHEATH) ×1 IMPLANT
PACK CARDIAC CATHETERIZATION (CUSTOM PROCEDURE TRAY) ×3 IMPLANT
PERCLOSE PROGLIDE 6F (VASCULAR PRODUCTS) ×6
SHEATH 14X36 EDWARDS (SHEATH) ×1 IMPLANT
SHEATH BRITE TIP 7FR 35CM (SHEATH) ×1 IMPLANT
SHEATH PINNACLE 6F 10CM (SHEATH) ×1 IMPLANT
SHEATH PINNACLE 8F 10CM (SHEATH) ×1 IMPLANT
SHEATH PROBE COVER 6X72 (BAG) ×1 IMPLANT
SHIELD RADPAD SCOOP 12X17 (MISCELLANEOUS) ×1 IMPLANT
SLEEVE REPOSITIONING LENGTH 30 (MISCELLANEOUS) ×1 IMPLANT
STOPCOCK MORSE 400PSI 3WAY (MISCELLANEOUS) ×7 IMPLANT
SYR MEDRAD MARK V 150ML (SYRINGE) ×1 IMPLANT
TRANSDUCER W/STOPCOCK (MISCELLANEOUS) ×6 IMPLANT
TUBE CONN 8.8X1320 FR HP M-F (CONNECTOR) ×1 IMPLANT
TUBING ART PRESS 72  MALE/FEM (TUBING) ×1
TUBING ART PRESS 72 MALE/FEM (TUBING) IMPLANT
TUBING CIL FLEX 10 FLL-RA (TUBING) ×1 IMPLANT
VALVE 23 ULTRA SAPIEN KIT (Valve) ×1 IMPLANT
WIRE AMPLATZ SS-J .035X180CM (WIRE) ×1 IMPLANT
WIRE EMERALD 3MM-J .035X150CM (WIRE) ×1 IMPLANT
WIRE EMERALD 3MM-J .035X260CM (WIRE) ×1 IMPLANT
WIRE EMERALD ST .035X260CM (WIRE) ×1 IMPLANT

## 2019-11-13 NOTE — Progress Notes (Signed)
  Echocardiogram 2D Echocardiogram has been performed.  [name] 11/13/2019, 3:18 PM

## 2019-11-13 NOTE — Op Note (Signed)
[facility]   MULTIDISCIPLINARY HEART VALVE TEAM   TAVR OPERATIVE NOTE   Date of Procedure:  11/13/2019  Preoperative Diagnosis: Severe Aortic Stenosis   Postoperative Diagnosis: Same   Procedure:    Transcatheter Aortic Valve Replacement - Percutaneous Transfemoral Approach  Edwards Sapien 3 Ultra THV (size 23 mm, model # 9750TFX, serial # [id number])   Co-Surgeons:  [name], MD and Sherren Mocha, MD  Anesthesiologist:  Suella Broad MD  Echocardiographer:  Ena Dawley MD  Pre-operative Echo Findings:  Severe aortic stenosis  Normal left ventricular systolic function  Post-operative Echo Findings:  No paravalvular leak  Normal/unchanged left ventricular systolic function  BRIEF CLINICAL NOTE AND INDICATIONS FOR SURGERY  [name] is a 64 y.o. female who presented for evaluation of aortic stenosis, referred by Dr Johnsie Cancel.  The patient has been followed for aortic stenosis with serial echo studies over the last several years.  She has been noted to have progressive aortic stenosis over the last few years.  She has a trileaflet aortic valve and her mean gradient in 2018 was 28 mmHg, increasing to 37 mmHg in 2019 and on her most recent echocardiogram she had a marked increase in mean gradient of 77 mmHg and peak gradient of 109 mmHg with a calculated aortic valve area of 0.76 cm.  The patient has subsequently undergone heart team evaluation and further studies have demonstrated suitable anatomy for transfemoral TAVR.   During the course of the patient's preoperative work up they have been evaluated comprehensively by a multidisciplinary team of specialists coordinated through the [facility] in the [facility].  They have been demonstrated to suffer from symptomatic severe aortic stenosis as noted above. The patient has been counseled extensively as to the relative risks and benefits of all  options for the treatment of severe aortic stenosis including long term medical therapy, conventional surgery for aortic valve replacement, and transcatheter aortic valve replacement.  The patient has been independently evaluated in formal cardiac surgical consultation by Dr Cyndia Bent, who deemed the patient appropriate for TAVR. Based upon review of all of the patient's preoperative diagnostic tests they are felt to be candidate for transcatheter aortic valve replacement using the transfemoral approach as an alternative to conventional surgery.    Following the decision to proceed with transcatheter aortic valve replacement, a discussion has been held regarding what types of management strategies would be attempted intraoperatively in the event of life-threatening complications, including whether or not the patient would be considered a candidate for the use of cardiopulmonary bypass and/or conversion to open sternotomy for attempted surgical intervention.  The patient has been advised of a variety of complications that might develop peculiar to this approach including but not limited to risks of death, stroke, paravalvular leak, aortic dissection or other major vascular complications, aortic annulus rupture, device embolization, cardiac rupture or perforation, acute myocardial infarction, arrhythmia, heart block or bradycardia requiring permanent pacemaker placement, congestive heart failure, respiratory failure, renal failure, pneumonia, infection, other late complications related to structural valve deterioration or migration, or other complications that might ultimately cause a temporary or permanent loss of functional independence or other long term morbidity.  The patient provides full informed consent for the procedure as described and all questions were answered preoperatively.  DETAILS OF THE OPERATIVE PROCEDURE  PREPARATION:   The patient is brought to the operating room on the above mentioned date  and central monitoring was established by the anesthesia  team including placement of a central venous catheter and radial arterial line. The patient is placed in the supine position on the operating table.  Intravenous antibiotics are administered. The patient is monitored closely throughout the procedure under conscious sedation.  Baseline transthoracic echocardiogram is performed. The patient's chest, abdomen, both groins, and both lower extremities are prepared and draped in a sterile manner. A time out procedure is performed.   PERIPHERAL ACCESS:   Using ultrasound guidance, femoral arterial and venous access is obtained with placement of 6 Fr sheaths on the left side.  A pigtail diagnostic catheter was passed through the femoral arterial sheath under fluoroscopic guidance into the aortic root.  A temporary transvenous pacemaker catheter was passed through the femoral venous sheath under fluoroscopic guidance into the right ventricle.  The pacemaker was tested to ensure stable lead placement and pacemaker capture. Aortic root angiography was performed in order to determine the optimal angiographic angle for valve deployment.  TRANSFEMORAL ACCESS:  A micropuncture technique is used to access the right femoral artery under fluoroscopic and ultrasound guidance.  2 Perclose devices are deployed at 10' and 2' positions to 'PreClose' the femoral artery. An 8 French sheath is placed and then an Amplatz Superstiff wire is advanced through the sheath. This is changed out for a 14 French transfemoral E-Sheath after progressively dilating over the Superstiff wire.  An AL-1 catheter was used to direct a straight-tip exchange length wire across the native aortic valve into the left ventricle. This was exchanged out for a pigtail catheter and position was confirmed in the LV apex. Simultaneous LV and Ao pressures were recorded.  The pigtail catheter was exchanged for an Amplatz Extra-stiff wire in the LV apex.     BALLOON AORTIC VALVULOPLASTY:  Not performed  TRANSCATHETER HEART VALVE DEPLOYMENT:  An Edwards Sapien 3 transcatheter heart valve (size 23 mm) was prepared and crimped per manufacturer's guidelines, and the proper orientation of the valve is confirmed on the [employer] delivery system. The valve was advanced through the introducer sheath using normal technique until in an appropriate position in the abdominal aorta beyond the sheath tip. The balloon was then retracted and using the fine-tuning wheel was centered on the valve. The valve was then advanced across the aortic arch using appropriate flexion of the catheter. The valve was carefully positioned across the aortic valve annulus. The Commander catheter was retracted using normal technique. Once final position of the valve has been confirmed by angiographic assessment, the valve is deployed while temporarily holding ventilation and during rapid ventricular pacing to maintain systolic blood pressure < 50 mmHg and pulse pressure < 10 mmHg. The balloon inflation is held for >3 seconds after reaching full deployment volume. Once the balloon has fully deflated the balloon is retracted into the ascending aorta and valve function is assessed using echocardiography. The patient's hemodynamic recovery following valve deployment is good.  The deployment balloon and guidewire are both removed. Echo demostrated acceptable post-procedural gradients, stable mitral valve function, and no aortic insufficiency.    PROCEDURE COMPLETION:  The sheath was removed and femoral artery closure is performed using the 2 previously deployed Perclose devices.  Protamine is administered once femoral arterial repair was complete. The site is clear with no evidence of bleeding or hematoma after the sutures are tightened. The temporary pacemaker and pigtail catheters are removed. Mynx closure is used for left femoral artery hemostasis.   The patient tolerated the procedure  well and is transported to the surgical intensive  care in stable condition. There were no immediate intraoperative complications. All sponge instrument and needle counts are verified correct at completion of the operation.   The patient received a total of 40 mL of intravenous contrast during the procedure.   Sherren Mocha, MD 11/13/2019 3:26 PM

## 2019-11-13 NOTE — Transfer of Care (Signed)
Immediate Anesthesia Transfer of Care Note  Patient: [name]  Procedure(s) Performed: TRANSCATHETER AORTIC VALVE REPLACEMENT, TRANSFEMORAL (N/A Chest) TRANSESOPHAGEAL ECHOCARDIOGRAM (TEE) (N/A )  Patient Location: Cath Lab  Anesthesia Type:MAC  Level of Consciousness: awake, alert  and oriented  Airway & Oxygen Therapy: Patient Spontanous Breathing and Patient connected to face mask oxygen  Post-op Assessment: Report given to RN, Post -op Vital signs reviewed and stable and Patient moving all extremities  Post vital signs: Reviewed and stable  Last Vitals:  Vitals Value Taken Time  BP 102/61 11/13/19 1523  Temp 36.4 C 11/13/19 1519  Pulse 66 11/13/19 1526  Resp 11 11/13/19 1526  SpO2 98 % 11/13/19 1526  Vitals shown include unvalidated device data.  Last Pain:  Vitals:   11/13/19 1519  TempSrc: Temporal  PainSc: Asleep      Patients Stated Pain Goal: 5 (38/10/17 5102)  Complications: No apparent anesthesia complications

## 2019-11-13 NOTE — Discharge Instructions (Signed)
ACTIVITY AND EXERCISE °• Daily activity and exercise are an important part of your recovery. People recover at different rates depending on their general health and type of valve procedure. °• Most people recovering from TAVR feel better relatively quickly  °• No lifting, pushing, pulling more than 10 pounds (examples to avoid: groceries, vacuuming, gardening, golfing): °            - For one week with a procedure through the groin. °            - For six weeks for procedures through the chest wall or neck. °NOTE: You will typically see one of our providers 7-14 days after your procedure to discuss WHEN TO RESUME the above activities.  °  °  °DRIVING °• Do not drive until you are seen for follow up and cleared by a provider. Generally, we ask patient to not drive for 1 week after their procedure. °• If you have been told by your doctor in the past that you may not drive, you must talk with him/her before you begin driving again. °  °DRESSING °• Groin site: you may leave the clear dressing over the site for up to one week or until it falls off. °  °HYGIENE °• If you had a femoral (leg) procedure, you may take a shower when you return home. After the shower, pat the site dry. Do NOT use powder, oils or lotions in your groin area until the site has completely healed. °• If you had a chest procedure, you may shower when you return home unless specifically instructed not to by your discharging practitioner. °            - DO NOT scrub incision; pat dry with a towel. °            - DO NOT apply any lotions, oils, powders to the incision. °            - No tub baths / swimming for at least 2 weeks. °• If you notice any fevers, chills, increased pain, swelling, bleeding or pus, please contact your doctor. °  °ADDITIONAL INFORMATION °• If you are going to have an upcoming dental procedure, please contact our office as you will require antibiotics ahead of time to prevent infection on your heart valve.  ° ° °If you have any  questions or concerns you can call the structural heart phone during normal business hours 8am-4pm. If you have an urgent need after hours or weekends please call [phone] to talk to the on call provider for general cardiology. If you have an emergency that requires immediate attention, please call 911.  ° ° °After TAVR Checklist ° °Check  Test Description  ° Follow up appointment in 1-2 weeks  You will see our structural heart physician assistant, Katie Angles Trevizo. Your incision sites will be checked and you will be cleared to drive and resume all normal activities if you are doing well.    ° 1 month echo and follow up  You will have an echo to check on your new heart valve and be seen back in the office by Katie Jacque Garrels. Many times the echo is not read by your appointment time, but Katie will call you later that day or the following day to report your results.  ° Follow up with your primary cardiologist You will need to be seen by your primary cardiologist in the following 3-6 months after your 1 month appointment in the valve   clinic. Often times your Plavix or Aspirin will be discontinued during this time, but this is decided on a case by case basis.   ° 1 year echo and follow up You will have another echo to check on your heart valve after 1 year and be seen back in the office by Katie Canaan Prue. This your last structural heart visit.  ° Bacterial endocarditis prophylaxis  You will have to take antibiotics for the rest of your life before all dental procedures (even teeth cleanings) to protect your heart valve. Antibiotics are also required before some surgeries. Please check with your cardiologist before scheduling any surgeries. Also, please make sure to tell us if you have a penicillin allergy as you will require an alternative antibiotic.   ° ° °

## 2019-11-13 NOTE — Anesthesia Procedure Notes (Signed)
Arterial Line Insertion Performed by: Oletta Lamas, CRNA, CRNA  Preanesthetic checklist: patient identified, IV checked, site marked, risks and benefits discussed, surgical consent, monitors and equipment checked, pre-op evaluation, timeout performed and anesthesia consent Lidocaine 1% used for infiltration Right, radial was placed Catheter size: 20 G Hand hygiene performed  and maximum sterile barriers used  Allen's test indicative of satisfactory collateral circulation Attempts: 1 Procedure performed without using ultrasound guided technique. Following insertion, dressing applied and Biopatch. Post procedure assessment: normal  Patient tolerated the procedure well with no immediate complications.

## 2019-11-13 NOTE — Progress Notes (Signed)
Rt radial arterial line d/c'ed, pressure held for 5 minutes. Gauze and tegaderm dressing applied. Palpable rt radial. Level 0.

## 2019-11-13 NOTE — Progress Notes (Signed)
  [facility] VALVE TEAM  Patient doing well s/p TAVR. She is hemodynamically stable. Groin sites stable. ECG with no high grade block. Arterial line discontinued and transfer/red  to 4E. Plan for early ambulation after bedrest completed and hopeful discharge over the next 24-48 hours.   Angelena Form PA-C  [facility]  Pager [phone]

## 2019-11-13 NOTE — Anesthesia Postprocedure Evaluation (Signed)
Anesthesia Post Note  Patient: [name]  Procedure(s) Performed: TRANSCATHETER AORTIC VALVE REPLACEMENT, TRANSFEMORAL (N/A Chest) TRANSESOPHAGEAL ECHOCARDIOGRAM (TEE) (N/A )     Patient location during evaluation: PACU Anesthesia Type: MAC Level of consciousness: awake and alert Pain management: pain level controlled Vital Signs Assessment: post-procedure vital signs reviewed and stable Respiratory status: spontaneous breathing, nonlabored ventilation, respiratory function stable and patient connected to nasal cannula oxygen Cardiovascular status: stable and blood pressure returned to baseline Postop Assessment: no apparent nausea or vomiting Anesthetic complications: no    Last Vitals:  Vitals:   11/13/19 1635 11/13/19 1649  BP: 112/66   Pulse: 67   Resp: 12   Temp:  36.4 C  SpO2: 99%     Last Pain:  Vitals:   11/13/19 1649  TempSrc: Temporal  PainSc:                  Effie Berkshire

## 2019-11-13 NOTE — Anesthesia Procedure Notes (Signed)
Procedure Name: MAC Date/Time: 11/13/2019 1:35 PM Performed by: Leonor Liv, CRNA Oxygen Delivery Method: Simple face mask Placement Confirmation: positive ETCO2 Dental Injury: Teeth and Oropharynx as per pre-operative assessment

## 2019-11-13 NOTE — Progress Notes (Signed)
Pt received from cath lab. VSS. Telemetry applied. Bilateral groin level 0. Call light in reach. Will continue to monitor.  Clyde Canterbury, RN

## 2019-11-13 NOTE — Op Note (Signed)
[facility]   MULTIDISCIPLINARY HEART VALVE TEAM   TAVR OPERATIVE NOTE   Date of Procedure:  11/13/2019  Preoperative Diagnosis: Severe Aortic Stenosis   Postoperative Diagnosis: Same   Procedure:    Transcatheter Aortic Valve Replacement - Percutaneous Right Transfemoral Approach  Edwards Sapien 3 Ultra THV (size 23 mm, model # 9750TFX, serial # [id number])   Co-Surgeons:  [name], MD and Sherren Mocha, MD   Anesthesiologist:  Suella Broad, MD  Echocardiographer:  Ena Dawley, MD  Pre-operative Echo Findings:  Severe aortic stenosis  Normal left ventricular systolic function  Post-operative Echo Findings:  No paravalvular leak  Normal left ventricular systolic function   BRIEF CLINICAL NOTE AND INDICATIONS FOR SURGERY  This 64 year old woman has stage C, asymptomatic, severe aortic stenosis and moderate aortic insufficiency and is New York Heart Association class I. She remains quite active walking 10 to 11 miles per day and denies any hint of symptoms. I have personally reviewed her 2D echocardiogram, cardiac catheterization, and CTA studies. Her 2D echocardiogram shows a trileaflet aortic valve with thickening and calcification of the valve leaflets and markedly restricted valve mobility. The mean gradient across the valve is 77 mmHg consistent with severe aortic stenosis and there is moderate aortic insufficiency with a pressure half-time of 297 ms. Left ventricular systolic function is normal. Cardiac catheterization showed a mean gradient of 37 mmHg across the aortic valve with a peak gradient of 52 mmHg and a calculated aortic valve area of 1.09 consistent with severe aortic stenosis. There is no coronary disease. I agree that given the appearance of her aortic valve and the high gradient in combination with moderate aortic insufficiency it would be best to proceed with aortic valve replacement to prevent sudden decompensation and deterioration  of left ventricular function.   The patient was counseled at length regarding treatment alternatives for management of severe aortic stenosis. The risks and benefits of surgical intervention has been discussed in detail. Long-term prognosis with medical therapy was discussed. Alternative approaches such as conventional surgical aortic valve replacement, transcatheter aortic valve replacement, and palliative medical therapy were compared and contrasted at length. This discussion was placed in the context of the patient's own specific clinical presentation and past medical history. All of their questions been addressed. She said that she would definitely rather undergo TAVR then open surgical aortic valve replacement since she is very busy at work and would like to continue to do so and care of 2 grandchildren at home. Her surgical risk is low but I think TAVR is a reasonable alternative at her age. I discussed the limited long-term data on transcatheter valve durability and the possibility that she may have structural valve deterioration requiring reintervention in the future. She said that she understands this but feels that TAVR is the best option for her at this time. Her gated cardiac CTA shows anatomy suitable for transcatheter aortic valve replacement using a Sapien 3 valve. Her abdominal and pelvic CTA shows adequate pelvic vascular anatomy to allow transfemoral insertion. Her chest CTA shows several small pulmonary nodules throughout the lungs bilaterally that are 4 mm or less in size. The largest is a groundglass attenuation nodule in the superior segment of left lower lobe measuring 12 x 10 mm. She said that these have been followed by Dr. Baltazar Apo of pulmonary medicine and have generally been stable. These will require continued follow-up given her age and smoking history.   Following the decision to proceed with transcatheter  aortic valve replacement, a discussion was held regarding what types  of management strategies would be attempted intraoperatively in the event of life-threatening complications, including whether or not the patient would be considered a candidate for the use of cardiopulmonary bypass and/or conversion to open sternotomy for attempted surgical intervention. She is a low risk surgical patient and would be a candidate for emergent sternotomy to manage any intraoperative complications.   The patient has been advised of a variety of complications that might develop including but not limited to risks of death, stroke, paravalvular leak, aortic dissection or other major vascular complications, aortic annulus rupture, device embolization, cardiac rupture or perforation, mitral regurgitation, acute myocardial infarction, arrhythmia, heart block or bradycardia requiring permanent pacemaker placement, congestive heart failure, respiratory failure, renal failure, pneumonia, infection, other late complications related to structural valve deterioration or migration, or other complications that might ultimately cause a temporary or permanent loss of functional independence or other long term morbidity. The patient provides full informed consent for the procedure as described and all questions were answered.     DETAILS OF THE OPERATIVE PROCEDURE  PREPARATION:    The patient is brought to the operating room on the above mentioned date and appropriate monitoring was established by the anesthesia team. The patient is placed in the supine position on the operating table.  Intravenous antibiotics are administered. The patient is monitored closely throughout the procedure under conscious sedation.   Baseline transthoracic echocardiogram was performed. The patient's  Abdomen and both groins were prepared and draped in a sterile manner. A time out procedure is performed.   PERIPHERAL ACCESS:    Using the modified Seldinger technique, femoral arterial and venous access was obtained with  placement of 6 Fr sheaths on the left side.  A pigtail diagnostic catheter was passed through the left arterial sheath under fluoroscopic guidance into the aortic root.  A temporary transvenous pacemaker catheter was passed through the left femoral venous sheath under fluoroscopic guidance into the right ventricle.  The pacemaker was tested to ensure stable lead placement and pacemaker capture. Aortic root angiography was performed in order to determine the optimal angiographic angle for valve deployment.   TRANSFEMORAL ACCESS:   Percutaneous transfemoral access and sheath placement was performed using ultrasound guidance.  The right common femoral artery was cannulated using a micropuncture needle and appropriate location was verified using hand injection angiogram.  A pair of Abbott Perclose percutaneous closure devices were placed and a 6 French sheath replaced into the femoral artery.  The patient was heparinized systemically and ACT verified > 250 seconds.    A 14 Fr transfemoral E-sheath was introduced into the right common femoral artery after progressively dilating over an Amplatz superstiff wire. An AL-1 catheter was used to direct a straight-tip exchange length wire across the native aortic valve into the left ventricle. This was exchanged out for a pigtail catheter and position was confirmed in the LV apex. Simultaneous LV and Ao pressures were recorded.  The pigtail catheter was exchanged for an Amplatz Extra-stiff wire in the LV apex.     BALLOON AORTIC VALVULOPLASTY:   Not performed   TRANSCATHETER HEART VALVE DEPLOYMENT:   An Edwards Sapien 3 Ultra transcatheter heart valve (size 23 mm, model #9750TFX, serial [id number]) was prepared and crimped per manufacturer's guidelines, and the proper orientation of the valve is confirmed on the [employer] delivery system. The valve was advanced through the introducer sheath using normal technique until in an appropriate position in the  abdominal aorta beyond the sheath tip. The balloon was then retracted and using the fine-tuning wheel was centered on the valve. The valve was then advanced across the aortic arch using appropriate flexion of the catheter. The valve was carefully positioned across the aortic valve annulus. The Commander catheter was retracted using normal technique. Once final position of the valve has been confirmed by angiographic assessment, the valve is deployed while temporarily holding ventilation and during rapid ventricular pacing to maintain systolic blood pressure < 50 mmHg and pulse pressure < 10 mmHg. The balloon inflation is held for >3 seconds after reaching full deployment volume. Once the balloon has fully deflated the balloon is retracted into the ascending aorta and valve function is assessed using echocardiography. There is felt to be no paravalvular leak and no central aortic insufficiency.  The patient's hemodynamic recovery following valve deployment is good.  The deployment balloon and guidewire are both removed.    PROCEDURE COMPLETION:   The sheath was removed and femoral artery closure performed.  Protamine was administered once femoral arterial repair was complete. The temporary pacemaker, pigtail catheters and femoral sheaths were removed with manual pressure used for hemostasis.  A Mynx femoral closure device was utilized following removal of the diagnostic sheath in the left femoral artery.  The patient tolerated the procedure well and is transported to the surgical intensive care in stable condition. There were no immediate intraoperative complications. All sponge instrument and needle counts are verified correct at completion of the operation.   No blood products were administered during the operation.  The patient received a total of 40 mL of intravenous contrast during the procedure.   [name], MD 11/13/2019

## 2019-11-13 NOTE — Interval H&P Note (Signed)
History and Physical Interval Note:  11/13/2019 10:54 AM  [name]  has presented today for surgery, with the diagnosis of Severe Aortic Stenosis.  The various methods of treatment have been discussed with the patient and family. After consideration of risks, benefits and other options for treatment, the patient has consented to  Procedure(s): TRANSCATHETER AORTIC VALVE REPLACEMENT, TRANSFEMORAL (N/A) TRANSESOPHAGEAL ECHOCARDIOGRAM (TEE) (N/A) as a surgical intervention.  The patient's history has been reviewed, patient examined, no change in status, stable for surgery.  I have reviewed the patient's chart and labs.  Questions were answered to the patient's satisfaction.     [name]

## 2019-11-14 ENCOUNTER — Inpatient Hospital Stay (HOSPITAL_COMMUNITY): Payer: Federal, State, Local not specified - PPO

## 2019-11-14 ENCOUNTER — Encounter: Payer: Self-pay | Admitting: Physician Assistant

## 2019-11-14 DIAGNOSIS — I352 Nonrheumatic aortic (valve) stenosis with insufficiency: Secondary | ICD-10-CM | POA: Diagnosis not present

## 2019-11-14 DIAGNOSIS — Z952 Presence of prosthetic heart valve: Secondary | ICD-10-CM | POA: Diagnosis not present

## 2019-11-14 DIAGNOSIS — I129 Hypertensive chronic kidney disease with stage 1 through stage 4 chronic kidney disease, or unspecified chronic kidney disease: Secondary | ICD-10-CM | POA: Diagnosis not present

## 2019-11-14 DIAGNOSIS — I350 Nonrheumatic aortic (valve) stenosis: Secondary | ICD-10-CM

## 2019-11-14 DIAGNOSIS — Z006 Encounter for examination for normal comparison and control in clinical research program: Secondary | ICD-10-CM

## 2019-11-14 DIAGNOSIS — K5792 Diverticulitis of intestine, part unspecified, without perforation or abscess without bleeding: Secondary | ICD-10-CM | POA: Diagnosis not present

## 2019-11-14 LAB — CBC
HCT: 38 % (ref 36.0–46.0)
Hemoglobin: 12.4 g/dL (ref 12.0–15.0)
MCH: 29.1 pg (ref 26.0–34.0)
MCHC: 32.6 g/dL (ref 30.0–36.0)
MCV: 89.2 fL (ref 80.0–100.0)
Platelets: 191 10*3/uL (ref 150–400)
RBC: 4.26 MIL/uL (ref 3.87–5.11)
RDW: 12.9 % (ref 11.5–15.5)
WBC: 8.9 10*3/uL (ref 4.0–10.5)
nRBC: 0 % (ref 0.0–0.2)

## 2019-11-14 LAB — BASIC METABOLIC PANEL
Anion gap: 7 (ref 5–15)
BUN: 14 mg/dL (ref 8–23)
CO2: 26 mmol/L (ref 22–32)
Calcium: 8.7 mg/dL — ABNORMAL LOW (ref 8.9–10.3)
Chloride: 101 mmol/L (ref 98–111)
Creatinine, Ser: 0.5 mg/dL (ref 0.44–1.00)
GFR calc Af Amer: >60 mL/min (ref >60)
GFR calc non Af Amer: >60 mL/min (ref >60)
Glucose, Bld: 100 mg/dL — ABNORMAL HIGH (ref 70–99)
Potassium: 3.6 mmol/L (ref 3.5–5.1)
Sodium: 134 mmol/L — ABNORMAL LOW (ref 135–145)

## 2019-11-14 LAB — MAGNESIUM: Magnesium: 1.3 mg/dL — ABNORMAL LOW (ref 1.7–2.4)

## 2019-11-14 MED ORDER — AMLODIPINE BESYLATE 5 MG PO TABS
5.0000 mg | ORAL_TABLET | Freq: Every day | ORAL | Status: DC
  Administered 2019-11-14: 5 mg via ORAL
  Filled 2019-11-14: qty 1

## 2019-11-14 MED ORDER — PANTOPRAZOLE SODIUM 40 MG PO TBEC: 40.0000 mg | DELAYED_RELEASE_TABLET | Freq: Every day | ORAL | 1 refills | Status: DC

## 2019-11-14 MED ORDER — TRAMADOL HCL 50 MG PO TABS
50.0000 mg | ORAL_TABLET | Freq: Four times a day (QID) | ORAL | Status: DC
  Administered 2019-11-14: 50 mg via ORAL
  Filled 2019-11-14: qty 1

## 2019-11-14 MED ORDER — CLOPIDOGREL BISULFATE 75 MG PO TABS: 75.0000 mg | ORAL_TABLET | Freq: Every day | ORAL | 1 refills | Status: DC

## 2019-11-14 NOTE — Progress Notes (Signed)
  Echocardiogram 2D Echocardiogram has been performed.  [name] 11/14/2019, 10:06 AM

## 2019-11-14 NOTE — Plan of Care (Signed)
  Problem: Education: Goal: Knowledge of General Education information will improve Description: Including pain rating scale, medication(s)/side effects and non-pharmacologic comfort measures 11/14/2019 1133 by Glenard Haring, RN Outcome: Adequate for Discharge 11/14/2019 1043 by Glenard Haring, RN Outcome: Adequate for Discharge

## 2019-11-14 NOTE — Progress Notes (Signed)
1 Day Post-Op Procedure(s) (LRB): TRANSCATHETER AORTIC VALVE REPLACEMENT, TRANSFEMORAL (N/A) TRANSESOPHAGEAL ECHOCARDIOGRAM (TEE) (N/A) Subjective:  No complaints. Ambulating well.  Objective: Vital signs in last 24 hours: Temp:  [97.4 F (36.3 C)-98.4 F (36.9 C)] 97.9 F (36.6 C) (01/20 0720) Pulse Rate:  [64-166] 88 (01/20 0720) Cardiac Rhythm: Normal sinus rhythm (01/20 0439) Resp:  [9-20] 15 (01/20 0720) BP: (102-148)/(56-81) 128/75 (01/20 0720) SpO2:  [94 %-100 %] 95 % (01/20 0720) Weight:  [65.1 kg-65.5 kg] 65.5 kg (01/20 0439)  Hemodynamic parameters for last 24 hours:    Intake/Output from previous day: 01/19 0701 - 01/20 0700 In: [id number] [P.O.:360; I.V.:1000; IV Piggyback:395] Out: 1050 [Urine:1050] Intake/Output this shift: No intake/output data recorded.  General appearance: alert and cooperative Neurologic: intact Heart: regular rate and rhythm, S1, S2 normal, no murmur, click, rub or gallop Lungs: clear to auscultation bilaterally Extremities: extremities normal, atraumatic, no cyanosis or edema Wound: groin sites ok  Lab Results: Recent Labs    11/13/19 1535 11/14/19 0210  WBC  --  8.9  HGB 12.6 12.4  HCT 37.0 38.0  PLT  --  191   BMET:  Recent Labs    11/13/19 1535 11/14/19 0210  NA 139 134*  K 3.7 3.6  CL 100 101  CO2  --  26  GLUCOSE 107* 100*  BUN 17 14  CREATININE 0.40* 0.50  CALCIUM  --  8.7*    PT/INR: No results for input(s): LABPROT, INR in the last 72 hours. ABG    Component Value Date/Time   PHART 7.419 11/09/2019 0930   HCO3 27.9 11/09/2019 0930   TCO2 28 11/13/2019 1535   O2SAT 97.3 11/09/2019 0930   CBG (last 3)  No results for input(s): GLUCAP in the last 72 hours.  ECG: sinus no acute changes  Assessment/Plan: S/P Procedure(s) (LRB): TRANSCATHETER AORTIC VALVE REPLACEMENT, TRANSFEMORAL (N/A) TRANSESOPHAGEAL ECHOCARDIOGRAM (TEE) (N/A)  POD 1 Hemodynamically stable in sinus rhythm.  Plan 2D echo this am and  home after that. Plavix and ASA.   LOS: 1 day    [name] 11/14/2019

## 2019-11-14 NOTE — Plan of Care (Signed)
  Problem: Education: Goal: Knowledge of General Education information will improve Description: Including pain rating scale, medication(s)/side effects and non-pharmacologic comfort measures Outcome: Adequate for Discharge   

## 2019-11-14 NOTE — Progress Notes (Signed)
Pt walked earlier and feels well. Now walking with husband. HR 105 ST. Discussed restrictions, walking at home until she goes back to work and when she retires, and [employer]. Declined CRPII. Very recetive. Gave HH diet. [id number] [place], ACSM 10:54 AM 11/14/2019

## 2019-11-14 NOTE — Discharge Summary (Addendum)
[name]  Discharge Summary    Patient ID: [name] MRN: [id number]; DOB: 1956/06/16  Admit date: 11/13/2019 Discharge date: 11/14/2019  Primary Care Provider: Leighton Ruff, MD  Primary Cardiologist: Jenkins Rouge, MD / Dr. Burt Knack & Dr. Cyndia Bent (TAVR)  Discharge Diagnoses    Principal Problem:   S/P TAVR (transcatheter aortic valve replacement) Active Problems:   Essential hypertension   GERD (gastroesophageal reflux disease)   Severe aortic stenosis   Pulmonary nodules   Renal cancer ([place])   Allergies Allergies  Allergen Reactions  . Diphenhydramine Hcl     loopy  . Ciprofloxacin Itching  . Codeine Itching  . Morphine And Related Nausea Only    Anxiety and Nausea     Diagnostic Studies/Procedures    TAVR OPERATIVE NOTE   Date of Procedure:                11/13/2019  Preoperative Diagnosis:      Severe Aortic Stenosis   Postoperative Diagnosis:    Same   Procedure:        Transcatheter Aortic Valve Replacement - Percutaneous Right Transfemoral Approach             Edwards Sapien 3 Ultra THV (size 23 mm, model # 9750TFX, serial # [id number])              Co-Surgeons:                        [name], MD and Sherren Mocha, MD   Anesthesiologist:                  Suella Broad, MD  Echocardiographer:              Ena Dawley, MD  Pre-operative Echo Findings: ? Severe aortic stenosis ? Normal left ventricular systolic function  Post-operative Echo Findings: ? No paravalvular leak ? Normal left ventricular systolic function  _____________    Echo 11/14/19: complete but pending formal read at the time of discharge    History of Present Illness     [name] is a 64 y.o. female with a history of HTN, renal cell carcinoma, severe AS who presented to [facility] on 11/12/18 for planned TAVR.  The patient has been followed for aortic stenosis with serial echo studies  over the last several years.  She has been noted to have progressive aortic stenosis over the last few years.  She has a trileaflet aortic valve and her mean gradient in 2018 was 28 mmHg, increasing to 37 mmHg in 2019 and on her most recent echocardiogram she had a marked increase in mean gradient of 77 mmHg and peak gradient of 109 mmHg with a calculated aortic valve area of 0.76 cm. [facility] 08/22/19 showed normal cors and confirmed severe AS.  The patient has been evaluated by the multidisciplinary valve team and felt to have severe, symptomatic aortic stenosis and to be a suitable candidate for TAVR, which was set up for 11/13/19.   Hospital Course     Consultants: none  Severe AS: s/p successful TAVR with a 23 mm Edwards Sapien 3 THV via the TF approach on 11/13/19. Post operative echo pending. Groin sites are stable. ECG with sinus and no high grade heart block. Continue Asprin and plavix. She had some chest pain that resolved with tylenol and atypical for cardiac etiology. ECG with no acute ischemic changes.  HTN:  BP a little elevated. Resume home medications.    GERD: Prilosec was changed to Protonix given potential drug drug interaction with Plavix.  Pulmonary nodules: multiple small pulmonary nodules in the lungs bilaterally, largest of which is a ground-glass attenuation nodule in the superior segment of the left lower lobe measuring 12 x 10 mm. Initial follow-up with CT at 6-12 months is recommended to confirm persistence. If persistent, repeat CT is recommended every 2 years until 5 years of stability has been established. This is due now. I will have this set up next week in the office.   _____________  Discharge Vitals Blood pressure 128/75, pulse 88, temperature 97.9 F (36.6 C), temperature source Oral, resp. rate 15, height 5\' 6"  (1.676 m), weight 65.5 kg, SpO2 95 %.  Filed Weights   11/13/19 1027 11/14/19 0439  Weight: 65.1 kg 65.5 kg    Labs & Radiologic Studies     CBC Recent Labs    11/13/19 1535 11/14/19 0210  WBC  --  8.9  HGB 12.6 12.4  HCT 37.0 38.0  MCV  --  89.2  PLT  --  99991111   Basic Metabolic Panel Recent Labs    11/13/19 1535 11/14/19 0210  NA 139 134*  K 3.7 3.6  CL 100 101  CO2  --  26  GLUCOSE 107* 100*  BUN 17 14  CREATININE 0.40* 0.50  CALCIUM  --  8.7*  MG  --  1.3*   Liver Function Tests No results for input(s): AST, ALT, ALKPHOS, BILITOT, PROT, ALBUMIN in the last 72 hours. No results for input(s): LIPASE, AMYLASE in the last 72 hours. Cardiac Enzymes No results for input(s): CKTOTAL, CKMB, CKMBINDEX, TROPONINI in the last 72 hours. BNP Invalid input(s): POCBNP D-Dimer No results for input(s): DDIMER in the last 72 hours. Hemoglobin A1C No results for input(s): HGBA1C in the last 72 hours. Fasting Lipid Panel No results for input(s): CHOL, HDL, LDLCALC, TRIG, CHOLHDL, LDLDIRECT in the last 72 hours. Thyroid Function Tests No results for input(s): TSH, T4TOTAL, T3FREE, THYROIDAB in the last 72 hours.  Invalid input(s): FREET3 _____________  DG Chest 2 View  Result Date: 11/09/2019 CLINICAL DATA:  Aortic stenosis, preoperative evaluation for TAVR; history renal cell carcinoma post RIGHT nephrectomy, GERD, hypertension, former smoker EXAM: CHEST - 2 VIEW COMPARISON:  02/28/2016 FINDINGS: Upper normal heart size. Mediastinal contours and pulmonary vascularity normal. Emphysematous and bronchitic changes consistent with COPD. No acute infiltrate, pleural effusion, or pneumothorax. Bones demineralized. IMPRESSION: COPD changes without acute abnormality. Electronically Signed   By: Lavonia Dana M.D.   On: 11/09/2019 09:53   DG Chest Port 1 View  Result Date: 11/13/2019 CLINICAL DATA:  64 year female status post TAVR. EXAM: PORTABLE CHEST 1 VIEW COMPARISON:  Chest radiograph dated 11/09/2019. FINDINGS: There is no focal consolidation, pleural effusion, pneumothorax. There is mild cardiomegaly. Aortic valve  replacement noted. No acute osseous pathology. IMPRESSION: Status post aortic valve replacement. No acute cardiopulmonary process. Electronically Signed   By: Anner Crete M.D.   On: 11/13/2019 17:27   VAS US CAROTID  Result Date: 11/09/2019 Carotid Arterial Duplex Study Indications:       TAVR. Risk Factors:      Hypertension. Comparison Study:  No prior studies. Performing Technologist: Oliver Hum RVT  Examination Guidelines: A complete evaluation includes B-mode imaging, spectral Doppler, color Doppler, and power Doppler as needed of all accessible portions of each vessel. Bilateral testing is considered an integral part of a complete examination.  Limited examinations for reoccurring indications may be performed as noted.  Right Carotid Findings: +----------+--------+--------+--------+-----------------------+--------+           PSV cm/sEDV cm/sStenosisPlaque Description     Comments +----------+--------+--------+--------+-----------------------+--------+ CCA Prox  82      22                                              +----------+--------+--------+--------+-----------------------+--------+ CCA Distal81      19              smooth and heterogenous         +----------+--------+--------+--------+-----------------------+--------+ ICA Prox  55      18              smooth and heterogenous         +----------+--------+--------+--------+-----------------------+--------+ ICA Distal77      30                                     tortuous +----------+--------+--------+--------+-----------------------+--------+ ECA       93      17                                              +----------+--------+--------+--------+-----------------------+--------+ +----------+--------+-------+--------+-------------------+           PSV cm/sEDV cmsDescribeArm Pressure (mmHG) +----------+--------+-------+--------+-------------------+ [id number]                                          +----------+--------+-------+--------+-------------------+ +---------+--------+--+--------+--+ VertebralPSV cm/s49EDV cm/s16 +---------+--------+--+--------+--+  Left Carotid Findings: +----------+--------+--------+--------+-----------------------+--------+           PSV cm/sEDV cm/sStenosisPlaque Description     Comments +----------+--------+--------+--------+-----------------------+--------+ CCA Prox  101     19              smooth and heterogenoustortuous +----------+--------+--------+--------+-----------------------+--------+ CCA Distal67      16              smooth and heterogenous         +----------+--------+--------+--------+-----------------------+--------+ ICA Prox  57      18              smooth and heterogenous         +----------+--------+--------+--------+-----------------------+--------+ ICA Distal85      30                                     tortuous +----------+--------+--------+--------+-----------------------+--------+ ECA       64      13                                              +----------+--------+--------+--------+-----------------------+--------+ +----------+--------+--------+--------+-------------------+           PSV cm/sEDV cm/sDescribeArm Pressure (mmHG) +----------+--------+--------+--------+-------------------+ [id number]                                          +----------+--------+--------+--------+-------------------+ +---------+--------+--+--------+-+---------+  VertebralPSV cm/s37EDV cm/s8Antegrade +---------+--------+--+--------+-+---------+  Summary: Right Carotid: Velocities in the right ICA are consistent with a 1-39% stenosis. Left Carotid: Velocities in the left ICA are consistent with a 1-39% stenosis. Vertebrals: Bilateral vertebral arteries demonstrate antegrade flow. *See table(s) above for measurements and observations.  Electronically signed by Monica Martinez MD on 11/09/2019 at 3:59:52  PM.    Final    ECHOCARDIOGRAM LIMITED  Result Date: 11/13/2019   ECHOCARDIOGRAM LIMITED REPORT   Patient Name:   DEJANIRA HANTHORN Date of Exam: 11/13/2019 Medical Rec #:  [id number]        Height:       66.0 in Accession #:    [id number]       Weight:       143.6 lb Date of Birth:  1956/07/03        BSA:          1.74 m Patient Age:    34 years         BP:           148/73 mmHg Patient Gender: F                HR:           88 bpm. Exam Location:  Inpatient  Procedure: Limited Echo, Cardiac Doppler and Color Doppler Indications:     I35.0 Nonrheumatic aortic (valve) stenosis  History:         Patient has prior history of Echocardiogram examinations, most                  recent 08/14/2019. Cancer. Aneurysm. Aortic stenosis.  Sonographer:     Roseanna Rainbow RDCS Referring Phys:  [place] Diagnosing Phys: Ena Dawley MD  Sonographer Comments: TAVR procedure. IMPRESSIONS  1. Left ventricular ejection fraction, by visual estimation, is 65 to 70%. The left ventricle has hyperdynamic function.  2. The mitral valve is degenerative. Mild mitral valve regurgitation.  3. Tricuspid valve regurgitation is mild.  4. Tricuspid valve regurgitation is mild.  5. Aortic valve mean gradient measures 42.0 mmHg.  6. Severe aortic valve stenosis.  7. There is severe calcifcation of the aortic valve.  8. There is severe thickening of the aortic valve.  9. TAVR procedure, conscious sedation, transfemoral access. A 23 mm Edwards-SAPIEN 3 Ultra valve was successfully placed in the aortic position with improvement of transaortic peak/mean gradients from 81/42 mmHg to 20/9 mmHg and no paravalvular leak. FINDINGS  Left Ventricle: Left ventricular ejection fraction, by visual estimation, is 65 to 70%. The left ventricle has hyperdynamic function. Mitral Valve: The mitral valve is degenerative in appearance. Mild mitral valve regurgitation. Tricuspid Valve: Tricuspid valve regurgitation is mild. Aortic Valve: Aortic valve  regurgitation is mild. Aortic regurgitation PHT measures 444 msec. Severe aortic stenosis is present. Aortic valve mean gradient measures 42.0 mmHg. Aortic valve peak gradient measures 31.2 mmHg. Aortic valve area, by VTI measures 1.34 cm. There is severe thickening of the aortic valve. There is severe calcifcation of the aortic valve. Pulmonic Valve: Pulmonic valve regurgitation is trivial by color flow Doppler. Pulmonic regurgitation is trivial by color flow Doppler.  LEFT VENTRICLE          Normals PLAX 2D LVOT diam:     2.10 cm  2.0 cm LVOT Area:     3.46 cm 3.14 cm2  AORTIC VALVE                    Normals AV Area (  Vmax):    1.64 cm AV Area (Vmean):   1.44 cm     3.06 cm2 AV Area (VTI):     1.34 cm AV Vmax:           279.20 cm/s AV Vmean:          230.400 cm/s 77 cm/s AV VTI:            0.797 m      3.15 cm2 AV Peak Grad:      31.2 mmHg AV Mean Grad:      42.0 mmHg    3 mmHg LVOT Vmax:         132.50 cm/s LVOT Vmean:        95.650 cm/s  75 cm/s LVOT VTI:          0.308 m      25.3 cm LVOT/AV VTI ratio: 0.39         1 AI PHT:            444 msec  SHUNTS Systemic VTI:  0.31 m Systemic Diam: 2.10 cm  Ena Dawley MD Electronically signed by Ena Dawley MD Signature Date/Time: 11/13/2019/3:13:58 PMThe mitral valve is degenerative in appearance.    Final    Structural Heart Procedure  Result Date: 11/13/2019 See surgical note for result.  Disposition   Pt is being discharged home today in good condition.  Follow-up Plans & Appointments    Follow-up Information    Eileen Stanford, PA-C. Go on 11/21/2019.   Specialties: Cardiology, Radiology Why: @ 1pm, please arrive at least 10 minutes early.  Contact information: [address] [place] [address] [phone]            Discharge Medications   Allergies as of 11/14/2019      Reactions   Diphenhydramine Hcl    loopy   Ciprofloxacin Itching   Codeine Itching   Morphine And Related Nausea Only    Anxiety and Nausea      Medication List    STOP taking these medications   fluticasone 50 MCG/ACT nasal spray Commonly known as: FLONASE   Lidocaine 4 % Ptch   naproxen sodium 220 MG tablet Commonly known as: ALEVE   omeprazole 40 MG capsule Commonly known as: PRILOSEC Replaced by: pantoprazole 40 MG tablet   tetrahydrozoline-zinc 0.05-0.25 % ophthalmic solution Commonly known as: VISINE-AC     TAKE these medications   amLODipine 5 MG tablet Commonly known as: NORVASC Take 1 tablet (5 mg total) by mouth daily.   clopidogrel 75 MG tablet Commonly known as: PLAVIX Take 1 tablet (75 mg total) by mouth daily with breakfast.   pantoprazole 40 MG tablet Commonly known as: PROTONIX Take 1 tablet (40 mg total) by mouth daily. Replaces: omeprazole 40 MG capsule           Outstanding Labs/Studies   None  Duration of Discharge Encounter   Greater than 30 minutes including physician time.  Mable Fill, PA-C 11/14/2019, 9:00 AM [phone]

## 2019-11-14 NOTE — Progress Notes (Signed)
Discharge instructions given to Mrs and Mr Krampitz.  Discussed medication changes, new medications and side effects.  Discussed follow up appointments and activities.  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  Discussed signs and symptoms to watch for and when to contact the physician.  Verbalized understanding.

## 2019-11-15 ENCOUNTER — Telehealth: Payer: Self-pay | Admitting: Physician Assistant

## 2019-11-15 LAB — ECHOCARDIOGRAM COMPLETE
Height: 66 in
Weight: 2310.4 oz

## 2019-11-15 MED FILL — Cefuroxime Sodium For IV Soln 1.5 GM: INTRAVENOUS | Qty: 1.5 | Status: AC

## 2019-11-15 NOTE — Telephone Encounter (Signed)
  [facility] VALVE TEAM   Patient contacted regarding discharge from [facility] on 11/14/19  Patient understands to follow up with provider Nell Range on 11/21/19 at [place].  Patient understands discharge instructions? yes Patient understands medications and regimen? yes Patient understands to bring all medications to this visit? yes  Angelena Form PA-C  [facility]

## 2019-11-19 NOTE — Progress Notes (Signed)
[facility] [place]                                       Cardiology Office Note    Date:  11/21/2019   ID:  [name], [name] October 16, 1956, MRN [id number]  PCP:  Leighton Ruff, MD  Cardiologist: Jenkins Rouge, MD / Dr. Burt Knack & Dr. Cyndia Bent (TAVR)  CC: [facility] s/p TAVR  History of Present Illness:  [name] is a 64 y.o. female with a history of HTN, renal cell carcinoma s/p resection, and severe AS s/p TAVR (11/13/19) who presents to clinic for follow up.   The patient has been followed for aortic stenosis with serial echo studies over the last several years. She has been noted to have progressive aortic stenosis over the last few years. She has a trileaflet aortic valve and her mean gradient in 2018 was 28 mmHg, increasing to 37 mmHg in 2019 and on her most recent echocardiogram she had a marked increase in mean gradient of 77 mmHg and peak gradient of 109 mmHg with a calculated aortic valve area of 0.76 cm. [facility] 08/22/19 showed normal cors and confirmed severe AS.  She was evaluated by the multidisciplinary valve team and underwent successful TAVR with a 23 mm Edwards Sapien 3 THV via the TF approach on 11/13/19. Post operative echo showed EF 65-70%, normally functioning TAVR with mean gradient of 19 mm hg and no PVL. She was discharged on aspirin and plavix. Prilosec changed to Protonix given potential drug drug interaction with palvix.   Today she presents to clinic for follow up. Has felt okay since surgery. Just doesn't feel herself. Has less energy. No CP or SOB. No LE edema, orthopnea or PND. No dizziness or syncope. No blood in stool or urine. No palpitations. She had n/v with Protonix and would like to try something else.   Past Medical History:  Diagnosis Date  . Diverticulitis, colon   . GERD (gastroesophageal reflux disease)   . Hypertension   . Pulmonary nodules    seen on pre TAVR CT, needs follow up   . Renal  cancer [facility]) 2007   Surgically removed from right kidney  . S/P TAVR (transcatheter aortic valve replacement)    s/p TAVR w/ a Edwards Sapien 3 Ultra THV via the TF approach on 11/13/19 with Dr. Burt Knack and Cyndia Bent.   . Severe aortic stenosis     Past Surgical History:  Procedure Laterality Date  . APPENDECTOMY    . COLON SURGERY    . HERNIA REPAIR    . OVARIAN CYST SURGERY    . RIGHT/LEFT HEART CATH AND CORONARY ANGIOGRAPHY N/A 08/22/2019   Procedure: RIGHT/LEFT HEART CATH AND CORONARY ANGIOGRAPHY;  Surgeon: Belva Crome, MD;  Location: [place] [facility];  Service: Cardiovascular;  Laterality: N/A;  . TEE WITHOUT CARDIOVERSION N/A 11/13/2019   Procedure: TRANSESOPHAGEAL ECHOCARDIOGRAM (TEE);  Surgeon: Sherren Mocha, MD;  Location: [place] [facility];  Service: Open Heart Surgery;  Laterality: N/A;  . TRANSCATHETER AORTIC VALVE REPLACEMENT, TRANSFEMORAL N/A 11/13/2019   Procedure: TRANSCATHETER AORTIC VALVE REPLACEMENT, TRANSFEMORAL;  Surgeon: Sherren Mocha, MD;  Location: [place] [facility];  Service: Open Heart Surgery;  Laterality: N/A;    Current Medications: Outpatient Medications Prior to Visit  Medication Sig Dispense Refill  . amLODipine (NORVASC) 5 MG tablet Take 1 tablet (5  mg total) by mouth daily. 90 tablet 3  . clopidogrel (PLAVIX) 75 MG tablet Take 1 tablet (75 mg total) by mouth daily with breakfast. 90 tablet 1  . pantoprazole (PROTONIX) 40 MG tablet Take 1 tablet (40 mg total) by mouth daily. (Patient not taking: Reported on 11/21/2019) 90 tablet 1   No facility-administered medications prior to visit.     Allergies:   Diphenhydramine hcl, Protonix [pantoprazole], Ciprofloxacin, Codeine, and Morphine and related   Social History   Socioeconomic History  . Marital status: Married    Spouse name: Not on file  . Number of children: Not on file  . Years of education: Not on file  . Highest education level: Not on file  Occupational History  . Not on file    Tobacco Use  . Smoking status: Former Smoker    Packs/day: 1.00    Years: 10.00    Pack years: 10.00    Types: Cigarettes    Quit date: 10/25/2005    Years since quitting: 14.0  . Smokeless tobacco: Never Used  . Tobacco comment: quit around 2007   Substance and Sexual Activity  . Alcohol use: Yes    Comment: occ  . Drug use: No  . Sexual activity: Not on file  Other Topics Concern  . Not on file  Social History Narrative  . Not on file   Social Determinants of Health   Financial Resource Strain:   . Difficulty of Paying Living Expenses: Not on file  Food Insecurity:   . Worried About Charity fundraiser in the Last Year: Not on file  . Ran Out of Food in the Last Year: Not on file  Transportation Needs:   . Lack of Transportation (Medical): Not on file  . Lack of Transportation (Non-Medical): Not on file  Physical Activity:   . Days of Exercise per Week: Not on file  . Minutes of Exercise per Session: Not on file  Stress:   . Feeling of Stress : Not on file  Social Connections:   . Frequency of Communication with Friends and Family: Not on file  . Frequency of Social Gatherings with Friends and Family: Not on file  . Attends Religious Services: Not on file  . Active Member of Clubs or Organizations: Not on file  . Attends Archivist Meetings: Not on file  . Marital Status: Not on file     Family History:  The patient's family history includes Cancer in her father, mother, and sister; Heart attack in her paternal aunt and paternal grandfather; Hypertension in her father.     ROS:   Please see the history of present illness.    ROS All other systems reviewed and are negative.   PHYSICAL EXAM:   VS:  BP 120/76   Pulse 96   Ht 5\' 6"  (1.676 m)   Wt 145 lb 6.4 oz (66 kg)   SpO2 97%   BMI 23.47 kg/m    GEN: Well nourished, well developed, in no acute distress HEENT: normal Neck: no JVD or masses Cardiac: RRR; soft flow murmur. No, rubs, or gallops,no  edema  Respiratory:  clear to auscultation bilaterally, normal work of breathing GI: soft, nontender, nondistended, + BS MS: no deformity or atrophy Skin: warm and dry, no rash Neuro:  Alert and Oriented x 3, Strength and sensation are intact.  Groin sites clear without hematoma or ecchymosis  Psych: euthymic mood, full affect   Wt Readings from Last 3  Encounters:  11/21/19 145 lb 6.4 oz (66 kg)  11/14/19 144 lb 6.4 oz (65.5 kg)  11/09/19 143 lb 9.6 oz (65.1 kg)      Studies/Labs Reviewed:   EKG:  EKG is ordered today.  The ekg ordered today demonstrates sinus HR 96 bpm  Recent Labs: 11/09/2019: ALT 17; B Natriuretic Peptide 28.0 11/14/2019: BUN 14; Creatinine, Ser 0.50; Hemoglobin 12.4; Magnesium 1.3; Platelets 191; Potassium 3.6; Sodium 134   Lipid Panel No results found for: CHOL, TRIG, HDL, CHOLHDL, VLDL, LDLCALC, LDLDIRECT  Additional studies/ records that were reviewed today include:  TAVR OPERATIVE NOTE   Date of Procedure:11/13/2019  Preoperative Diagnosis:Severe Aortic Stenosis   Postoperative Diagnosis:Same   Procedure:   Transcatheter Aortic Valve Replacement - PercutaneousRightTransfemoral Approach Edwards Sapien 3 Ultra THV (size 64mm, model # 9750TFX, serial # [id number])  Co-Surgeons:Bryan Alveria Apley, MDand Sherren Mocha, MD   Anesthesiologist:Kevin Smith Robert, MD  Dala Dock, MD  Pre-operative Echo Findings: ? Severe aortic stenosis ? Normalleft ventricular systolic function  Post-operative Echo Findings: ? Noparavalvular leak ? Normalleft ventricular systolic function  _____________    Echo 11/14/19 IMPRESSIONS  1. Left ventricular ejection fraction, by visual estimation, is 65 to 70%. The left ventricle has normal function. There is mildly increased left ventricular hypertrophy.  2. Left  ventricular diastolic parameters are consistent with Grade I diastolic dysfunction (impaired relaxation).  3. The left ventricle has no regional wall motion abnormalities.  4. Global right ventricle has normal systolic function.The right ventricular size is normal. No increase in right ventricular wall thickness.  5. Left atrial size was normal.  6. Right atrial size was normal.  7. The mitral valve is normal in structure. Trivial mitral valve regurgitation. No evidence of mitral stenosis.  8. The tricuspid valve is normal in structure.  9. The tricuspid valve is normal in structure. Tricuspid valve regurgitation is trivial. 10. Aortic valve regurgitation is not visualized. No evidence of aortic valve sclerosis or stenosis. 11. The pulmonic valve was normal in structure. Pulmonic valve regurgitation is trivial. 12. The inferior vena cava is normal in size with greater than 50% respiratory variability, suggesting right atrial pressure of 3 mmHg. 13. Day 1 post TAVR, a 23 Edwards-SAPIEN 3 Ultra valve sits well in the aortic position with borderline transaortic gradients with peak/mean gradients 36/19 mmHg, no paravalvular leak.  ASSESSMENT & PLAN:   Severe AS s/p TAVR:feeling okay. Energy not back to normal. Groin sites are clear. ECG with no HAVB. Continue on aspirin and plavix. I will see her back next month for echo and follow up.   HTN: BP well controlled. Continue current regimen   GERD: Prilosec was changed to Protonix given potential drug drug interaction with Plavix. Protonix did not agree with her and so we will trail Dexiant  Pulmonary nodules: these are followed closely by Dr. Brock Ra. He recommended another CT scan in 2 years. (see phone note from 04/17/19).   Medication Adjustments/Labs and Tests Ordered: Current medicines are reviewed at length with the patient today.  Concerns regarding medicines are outlined above.  Medication changes, Labs and Tests ordered today are listed  in the Patient Instructions below. Patient Instructions  Medication Instructions:  Start dexilant 30 mg daily for GERD  *If you need a refill on your cardiac medications before your next appointment, please call your pharmacy*  Lab Work: none  Testing/Procedures: Echo as scheduled  Follow-Up: As scheduled.  See below.  Other Instructions      Signed, Angelena Form, PA-C  11/21/2019 4:05 PM    [facility] [place], [place], [place]  [address] Phone: [phone]; Fax: [phone]

## 2019-11-21 ENCOUNTER — Other Ambulatory Visit: Payer: Self-pay

## 2019-11-21 ENCOUNTER — Encounter: Payer: Self-pay | Admitting: Physician Assistant

## 2019-11-21 ENCOUNTER — Other Ambulatory Visit: Payer: Self-pay | Admitting: Physician Assistant

## 2019-11-21 ENCOUNTER — Ambulatory Visit: Payer: Federal, State, Local not specified - PPO | Admitting: Physician Assistant

## 2019-11-21 VITALS — BP 120/76 | HR 96 | Ht 66.0 in | Wt 145.4 lb

## 2019-11-21 DIAGNOSIS — I10 Essential (primary) hypertension: Secondary | ICD-10-CM

## 2019-11-21 DIAGNOSIS — R918 Other nonspecific abnormal finding of lung field: Secondary | ICD-10-CM

## 2019-11-21 DIAGNOSIS — K219 Gastro-esophageal reflux disease without esophagitis: Secondary | ICD-10-CM

## 2019-11-21 DIAGNOSIS — Z952 Presence of prosthetic heart valve: Secondary | ICD-10-CM | POA: Diagnosis not present

## 2019-11-21 MED ORDER — DEXLANSOPRAZOLE 30 MG PO CPDR: 30.0000 mg | DELAYED_RELEASE_CAPSULE | Freq: Every day | ORAL | 6 refills | Status: DC

## 2019-11-21 MED ORDER — ASPIRIN EC 81 MG PO TBEC: 81.0000 mg | DELAYED_RELEASE_TABLET | Freq: Every day | ORAL | 2 refills | Status: DC

## 2019-11-21 MED ORDER — FLUTICASONE PROPIONATE 50 MCG/ACT NA SUSP: 1.0000 | Freq: Every day | NASAL | 0 refills | Status: DC

## 2019-11-21 NOTE — Patient Instructions (Signed)
Medication Instructions:  Start dexilant 30 mg daily for GERD  *If you need a refill on your cardiac medications before your next appointment, please call your pharmacy*  Lab Work: none  Testing/Procedures: Echo as scheduled  Follow-Up: As scheduled.  See below.  Other Instructions

## 2019-11-23 ENCOUNTER — Telehealth: Payer: Self-pay | Admitting: Physician Assistant

## 2019-11-23 NOTE — Telephone Encounter (Signed)
New Message:    Pt needs Prior Authorization for Dexilant 30 mg. Please call to [phone] for this authorization.

## 2019-11-23 NOTE — Telephone Encounter (Signed)
I have started a Dexilant PA through covermymeds: Key: [id number]  Following message received from covermymeds: Jori Moll Key: [facility] - PA Case [id number]  Outcome  Approved today  Your PA request has been approved. Additional information will be provided in the approval communication. Drug Dexilant 30MG  dr capsules   I have notified [facility] of this approval.

## 2019-11-27 NOTE — Telephone Encounter (Signed)
**Note De-Identified [name] Obfuscation** Letter received from [facility] stating that they have approved the pts Dexilant PA until 11/22/2020.

## 2019-12-05 NOTE — Progress Notes (Addendum)
[facility] [place]                                       Cardiology Office Note    Date:  12/20/2019   ID:  Tiffany, [name] July 27, 1956, MRN [id number]  PCP:  Leighton Ruff, MD  Cardiologist: Jenkins Rouge, MD / Dr. Burt Knack & Dr. Cyndia Bent (TAVR)  CC: 1 month s/p TAVR  History of Present Illness:  [name] is a 64 y.o. female with a history of HTN, renal cell carcinoma s/p resection, and severe AS s/p TAVR (11/13/19) who presents to clinic for follow up.   The patient has been followed for aortic stenosis with serial echo studies over the last several years. She has been noted to have progressive aortic stenosis over the last few years. She has a trileaflet aortic valve and her mean gradient in 2018 was 28 mmHg, increasing to 37 mmHg in 2019 and on her most recent echocardiogram she had a marked increase in mean gradient of 77 mmHg and peak gradient of 109 mmHg with a calculated aortic valve area of 0.76 cm. [name] 08/22/19 showed normal cors and confirmed severe AS.  She was evaluated by the multidisciplinary valve team and underwent successful TAVR with a 23 mm Edwards Sapien 3 THV via the TF approach on 11/13/19. Post operative echo showed EF 65-70%, normally functioning TAVR with mean gradient of 19 mm hg and no PVL. She was discharged on aspirin and plavix. Prilosec changed to Protonix given potential drug drug interaction with palvix. At follow up she did not think Prilosec helped and so we switched her to [place].   Today she presents to clinic for follow up. No CP or SOB. No LE edema, orthopnea or PND. No dizziness or syncope. No blood in stool or urine. No palpitations.  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 She has been on Dexilant which is working much better than Protonix but it was prohibitively expensive. Nothing else works for her. She also has been noticing that her pulse has been elevated and it  gives her a headache.    Past Medical History:  Diagnosis Date  . Diverticulitis, colon   . GERD (gastroesophageal reflux disease)   . Hypertension   . Pulmonary nodules    seen on pre TAVR CT, needs follow up   . Renal cancer [facility]) 2007   Surgically removed from right kidney  . S/P TAVR (transcatheter aortic valve replacement)    s/p TAVR w/ a Edwards Sapien 3 Ultra THV via the TF approach on 11/13/19 with Dr. Burt Knack and Cyndia Bent.   . Severe aortic stenosis     Past Surgical History:  Procedure Laterality Date  . APPENDECTOMY    . COLON SURGERY    . HERNIA REPAIR    . OVARIAN CYST SURGERY    . RIGHT/LEFT HEART CATH AND CORONARY ANGIOGRAPHY N/A 08/22/2019   Procedure: RIGHT/LEFT HEART CATH AND CORONARY ANGIOGRAPHY;  Surgeon: Belva Crome, MD;  Location: [place] [facility];  Service: Cardiovascular;  Laterality: N/A;  . TEE WITHOUT CARDIOVERSION N/A 11/13/2019   Procedure: TRANSESOPHAGEAL ECHOCARDIOGRAM (TEE);  Surgeon: Sherren Mocha, MD;  Location: [place] [facility];  Service: Open Heart Surgery;  Laterality: N/A;  . TRANSCATHETER AORTIC VALVE REPLACEMENT, TRANSFEMORAL N/A 11/13/2019   Procedure: TRANSCATHETER AORTIC VALVE REPLACEMENT,  TRANSFEMORAL;  Surgeon: Sherren Mocha, MD;  Location: [place] [facility];  Service: Open Heart Surgery;  Laterality: N/A;    Current Medications: Outpatient Medications Prior to Visit  Medication Sig Dispense Refill  . amLODipine (NORVASC) 5 MG tablet Take 1 tablet (5 mg total) by mouth daily. 90 tablet 3  . aspirin EC 81 MG tablet Take 1 tablet (81 mg total) by mouth daily. 150 tablet 2  . clopidogrel (PLAVIX) 75 MG tablet Take 1 tablet (75 mg total) by mouth daily with breakfast. 90 tablet 1  . fluticasone (FLONASE) 50 MCG/ACT nasal spray Place 1 spray into both nostrils daily. 1 g 0  . Dexlansoprazole 30 MG capsule Take 1 capsule (30 mg total) by mouth daily. 30 capsule 6   No facility-administered medications prior to visit.      Allergies:   Diphenhydramine hcl, Protonix [pantoprazole], Ciprofloxacin, Codeine, and Morphine and related   Social History   Socioeconomic History  . Marital status: Married    Spouse name: Not on file  . Number of children: Not on file  . Years of education: Not on file  . Highest education level: Not on file  Occupational History  . Not on file  Tobacco Use  . Smoking status: Former Smoker    Packs/day: 1.00    Years: 10.00    Pack years: 10.00    Types: Cigarettes    Quit date: 10/25/2005    Years since quitting: 14.1  . Smokeless tobacco: Never Used  . Tobacco comment: quit around 2007   Substance and Sexual Activity  . Alcohol use: Yes    Comment: occ  . Drug use: No  . Sexual activity: Not on file  Other Topics Concern  . Not on file  Social History Narrative  . Not on file   Social Determinants of Health   Financial Resource Strain:   . Difficulty of Paying Living Expenses: Not on file  Food Insecurity:   . Worried About Charity fundraiser in the Last Year: Not on file  . Ran Out of Food in the Last Year: Not on file  Transportation Needs:   . Lack of Transportation (Medical): Not on file  . Lack of Transportation (Non-Medical): Not on file  Physical Activity:   . Days of Exercise per Week: Not on file  . Minutes of Exercise per Session: Not on file  Stress:   . Feeling of Stress : Not on file  Social Connections:   . Frequency of Communication with Friends and Family: Not on file  . Frequency of Social Gatherings with Friends and Family: Not on file  . Attends Religious Services: Not on file  . Active Member of Clubs or Organizations: Not on file  . Attends Archivist Meetings: Not on file  . Marital Status: Not on file     Family History:  The patient's family history includes Cancer in her father, mother, and sister; Heart attack in her paternal aunt and paternal grandfather; Hypertension in her father.     ROS:   Please see the  history of present illness.    ROS All other systems reviewed and are negative.   PHYSICAL EXAM:   VS:  BP 136/72   Pulse (!) 103   Ht 5\' 6"  (1.676 m)   Wt 148 lb (67.1 kg)   SpO2 96%   BMI 23.89 kg/m    GEN: Well nourished, well developed, in no acute distress HEENT: normal Neck: no  JVD or masses Cardiac: RRR; soft flow murmur. No, rubs, or gallops,no edema  Respiratory:  clear to auscultation bilaterally, normal work of breathing GI: soft, nontender, nondistended, + BS MS: no deformity or atrophy Skin: warm and dry, no rash Neuro:  Alert and Oriented x 3, Strength and sensation are intact.  Groin sites clear without hematoma or ecchymosis  Psych: euthymic mood, full affect   Wt Readings from Last 3 Encounters:  12/19/19 148 lb (67.1 kg)  11/21/19 145 lb 6.4 oz (66 kg)  11/14/19 144 lb 6.4 oz (65.5 kg)      Studies/Labs Reviewed:   EKG:  EKG is NOT ordered today.   Recent Labs: 11/09/2019: ALT 17; B Natriuretic Peptide 28.0 11/14/2019: BUN 14; Creatinine, Ser 0.50; Hemoglobin 12.4; Magnesium 1.3; Platelets 191; Potassium 3.6; Sodium 134   Lipid Panel No results found for: CHOL, TRIG, HDL, CHOLHDL, VLDL, LDLCALC, LDLDIRECT  Additional studies/ records that were reviewed today include:  TAVR OPERATIVE NOTE   Date of Procedure:11/13/2019  Preoperative Diagnosis:Severe Aortic Stenosis   Postoperative Diagnosis:Same   Procedure:   Transcatheter Aortic Valve Replacement - PercutaneousRightTransfemoral Approach Edwards Sapien 3 Ultra THV (size 77mm, model # 9750TFX, serial # [id number])  Co-Surgeons:Bryan Alveria Apley, MDand Sherren Mocha, MD   Anesthesiologist:Kevin Smith Robert, MD  Dala Dock, MD  Pre-operative Echo Findings: ? Severe aortic stenosis ? Normalleft ventricular systolic function  Post-operative Echo  Findings: ? Noparavalvular leak ? Normalleft ventricular systolic function  _____________    Echo 11/14/19 IMPRESSIONS  1. Left ventricular ejection fraction, by visual estimation, is 65 to 70%. The left ventricle has normal function. There is mildly increased left ventricular hypertrophy.  2. Left ventricular diastolic parameters are consistent with Grade I diastolic dysfunction (impaired relaxation).  3. The left ventricle has no regional wall motion abnormalities.  4. Global right ventricle has normal systolic function.The right ventricular size is normal. No increase in right ventricular wall thickness.  5. Left atrial size was normal.  6. Right atrial size was normal.  7. The mitral valve is normal in structure. Trivial mitral valve regurgitation. No evidence of mitral stenosis.  8. The tricuspid valve is normal in structure.  9. The tricuspid valve is normal in structure. Tricuspid valve regurgitation is trivial. 10. Aortic valve regurgitation is not visualized. No evidence of aortic valve sclerosis or stenosis. 11. The pulmonic valve was normal in structure. Pulmonic valve regurgitation is trivial. 12. The inferior vena cava is normal in size with greater than 50% respiratory variability, suggesting right atrial pressure of 3 mmHg. 13. Day 1 post TAVR, a 23 Edwards-SAPIEN 3 Ultra valve sits well in the aortic position with borderline transaortic gradients with peak/mean gradients 36/19 mmHg, no paravalvular leak.  _____________    Echo 12/19/19 IMPRESSIONS  1. Left ventricular ejection fraction, by estimation, is 60 to 65%. The  left ventricle has normal function. The left ventricle has no regional  wall motion abnormalities. There is mild concentric left ventricular  hypertrophy. Left ventricular diastolic  parameters are consistent with Grade II diastolic dysfunction  (pseudonormalization).  2. Right ventricular systolic function is normal. The right ventricular  size  is normal. There is mildly elevated pulmonary artery systolic  pressure.  3. The mitral valve is normal in structure and function. Trivial mitral  valve regurgitation. No evidence of mitral stenosis.  4. Gradients compared to POD 1 are stable (peak 36 -> 34, mean 19 -> 20  mmHg). Trivial paravalvular leak.. The aortic valve has been  repaired/replaced. Aortic  valve regurgitation is trivial. No aortic  stenosis is present. There is a 23 mm Edwards Sapien  prosthetic (TAVR) valve present in the aortic position. Procedure Date:  11/13/2019. Echo findings are consistent with normal structure and  function of the aortic valve prosthesis.  5. The inferior vena cava is normal in size with greater than 50%  respiratory variability, suggesting right atrial pressure of 3 mmHg.   ASSESSMENT & PLAN:   Severe AS s/p TAVR: echo today shows EF 60%, normally functioning TAVR with a mean 20 mm hg and trivial PVL. She has NYHA class I symptoms. SBE prophylaxis discussed; she has dentures and does not visit the dentist. Plavix can be discontinued after 6 months of therapy (04/2020). Will see her back in 1 year for follow up and an echo  HTN: BP well controlled. Continue same regimen   GERD: Prilosec was changed to Protonix given potential drug drug interaction with Plavix. Protonix did not work well so she was switched to [employer]. This was prohibitively expensive. She would prefer to go back on Prilosec. There is a small chance this combination could decrease the efficacy of plavix. There has been new data that aspirin alone may be sufficient s/p TAVR, so I think she should be fine on Plavix/prilosec combo.   Pulmonary nodules: these are followed closely by Dr. Brock Ra. He recommended another CT scan in 2 years. (see phone note from 04/17/19).  Inappropriate sinus tachycardia: she has noticed that her pulse has been elevated and it gives her a headache. I have reviewed her notes back to December and it  appears to have been elevated up to 108 bpm since then. She is not hypoxic, CTs for pre TAVR work up did not show PE, she does not have any evidence of infection, echo with structurally normal heart. I will start Toprol XL 25mg  daily to see if this helps her symptoms.    Medication Adjustments/Labs and Tests Ordered: Current medicines are reviewed at length with the patient today.  Concerns regarding medicines are outlined above.  Medication changes, Labs and Tests ordered today are listed in the Patient Instructions below. Patient Instructions  Medication Instructions:  Your physician has recommended you make the following change in your medication:  1. STOP Dexlansoprazole 2. START Prilosec 40 mg once daily 3. STOP Plavix in 6 months  *If you need a refill on your cardiac medications before your next appointment, please call your pharmacy*  Lab Work: None ordered If you have labs (blood work) drawn today and your tests are completely normal, you will receive your results only by: Marland Kitchen MyChart Message (if you have MyChart) OR . A paper copy in the mail If you have any lab test that is abnormal or we need to change your treatment, we will call you to review the results.  Testing/Procedures: None ordered  Follow-Up: At [facility], you and your health needs are our priority.  As part of our continuing mission to provide you with exceptional heart care, we have created designated Provider Care Teams.  These Care Teams include your primary Cardiologist (physician) and Advanced Practice Providers (APPs -  Physician Assistants and Nurse Practitioners) who all work together to provide you with the care you need, when you need it.  Your next appointment:   May/June  The format for your next appointment:   In Person  Provider:   Jenkins Rouge, MD       Signed, Angelena Form, PA-C  12/20/2019 1:32 PM  [facility] [place], [place], [place]   [address] Phone: [phone]; Fax: [phone]

## 2019-12-19 ENCOUNTER — Other Ambulatory Visit: Payer: Self-pay

## 2019-12-19 ENCOUNTER — Ambulatory Visit: Payer: Federal, State, Local not specified - PPO | Admitting: Physician Assistant

## 2019-12-19 ENCOUNTER — Ambulatory Visit (HOSPITAL_COMMUNITY): Payer: Federal, State, Local not specified - PPO | Attending: Physician Assistant

## 2019-12-19 ENCOUNTER — Encounter: Payer: Self-pay | Admitting: Physician Assistant

## 2019-12-19 VITALS — BP 136/72 | HR 103 | Ht 66.0 in | Wt 148.0 lb

## 2019-12-19 DIAGNOSIS — Z952 Presence of prosthetic heart valve: Secondary | ICD-10-CM | POA: Diagnosis not present

## 2019-12-19 DIAGNOSIS — K219 Gastro-esophageal reflux disease without esophagitis: Secondary | ICD-10-CM | POA: Diagnosis not present

## 2019-12-19 DIAGNOSIS — R918 Other nonspecific abnormal finding of lung field: Secondary | ICD-10-CM

## 2019-12-19 DIAGNOSIS — I10 Essential (primary) hypertension: Secondary | ICD-10-CM

## 2019-12-19 LAB — ECHOCARDIOGRAM COMPLETE
Height: 66 in
Weight: 2368 oz

## 2019-12-19 MED ORDER — OMEPRAZOLE 40 MG PO CPDR: 40.0000 mg | DELAYED_RELEASE_CAPSULE | Freq: Every day | ORAL | 3 refills | Status: DC

## 2019-12-19 NOTE — Patient Instructions (Addendum)
Medication Instructions:  Your physician has recommended you make the following change in your medication:  1. STOP Dexlansoprazole 2. START Prilosec 40 mg once daily 3. STOP Plavix in 6 months  *If you need a refill on your cardiac medications before your next appointment, please call your pharmacy*  Lab Work: None ordered If you have labs (blood work) drawn today and your tests are completely normal, you will receive your results only by: Marland Kitchen MyChart Message (if you have MyChart) OR . A paper copy in the mail If you have any lab test that is abnormal or we need to change your treatment, we will call you to review the results.  Testing/Procedures: None ordered  Follow-Up: At [facility], you and your health needs are our priority.  As part of our continuing mission to provide you with exceptional heart care, we have created designated Provider Care Teams.  These Care Teams include your primary Cardiologist (physician) and Advanced Practice Providers (APPs -  Physician Assistants and Nurse Practitioners) who all work together to provide you with the care you need, when you need it.  Your next appointment:   May/June  The format for your next appointment:   In Person  Provider:   Jenkins Rouge, MD

## 2019-12-20 MED ORDER — METOPROLOL SUCCINATE ER 25 MG PO TB24: 25.0000 mg | ORAL_TABLET | Freq: Every day | ORAL | 1 refills | Status: DC

## 2019-12-20 NOTE — Addendum Note (Signed)
Addended by: Eileen Stanford on: 12/20/2019 01:32 PM   Modules accepted: Orders

## 2020-01-04 ENCOUNTER — Other Ambulatory Visit: Payer: Self-pay | Admitting: Physician Assistant

## 2020-03-06 NOTE — Progress Notes (Signed)
Cardiology Office Note    Date:  03/14/2020   ID:  [name], Tiffany [birth year], MRN [id number]  PCP:  Leighton Ruff, MD  Cardiologist: Jenkins Rouge, MD / Dr. Burt Knack & Dr. Cyndia Bent (TAVR)  CC: Post TAVR   History of Present Illness:  [name] is a 64 y.o. female with a history of HTN, renal cell carcinoma s/p resection, and severe AS s/p TAVR (11/13/19) who presents to clinic for follow up.   TTE 07/2019  marked increase in mean gradient of 77 mmHg and peak gradient of 109 mmHg with a calculated aortic valve area of 0.76 cm. [facility] 08/22/19 showed normal cors and confirmed severe AS.  Underwent successful TAVR with a 23 mm Edwards Sapien 3 THV via the TF approach on 11/13/19. Post operative echo showed EF 65-70%, normally functioning TAVR with mean gradient of 19 mm hg and no PVL. She was discharged on aspirin and plavix. 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 She has been on Dexilant which is working much better than Protonix but it was prohibitively expensive. And put back on prilosec. February seen by PA and HR high started on Toprol   She did not take it as her pulse was better when she stopped working som much extra   Past Medical History:  Diagnosis Date  . Diverticulitis, colon   . GERD (gastroesophageal reflux disease)   . Hypertension   . Pulmonary nodules    seen on pre TAVR CT, needs follow up   . Renal cancer [facility]) 2007   Surgically removed from right kidney  . S/P TAVR (transcatheter aortic valve replacement)    s/p TAVR w/ a Edwards Sapien 3 Ultra THV via the TF approach on 11/13/19 with Dr. Burt Knack and Cyndia Bent.   . Severe aortic stenosis     Past Surgical History:  Procedure Laterality Date  . APPENDECTOMY    . COLON SURGERY    . HERNIA REPAIR    .  OVARIAN CYST SURGERY    . RIGHT/LEFT HEART CATH AND CORONARY ANGIOGRAPHY N/A 08/22/2019   Procedure: RIGHT/LEFT HEART CATH AND CORONARY ANGIOGRAPHY;  Surgeon: Belva Crome, MD;  Location: [place] [facility];  Service: Cardiovascular;  Laterality: N/A;  . TEE WITHOUT CARDIOVERSION N/A 11/13/2019   Procedure: TRANSESOPHAGEAL ECHOCARDIOGRAM (TEE);  Surgeon: Sherren Mocha, MD;  Location: [place] [facility];  Service: Open Heart Surgery;  Laterality: N/A;  . TRANSCATHETER AORTIC VALVE REPLACEMENT, TRANSFEMORAL N/A 11/13/2019   Procedure: TRANSCATHETER AORTIC VALVE REPLACEMENT, TRANSFEMORAL;  Surgeon: Sherren Mocha, MD;  Location: [place] [facility];  Service: Open Heart Surgery;  Laterality: N/A;    Current Medications: Outpatient Medications Prior to Visit  Medication Sig Dispense Refill  . amLODipine (NORVASC) 5 MG tablet Take 1 tablet (5 mg total) by mouth daily. 90 tablet 3  . aspirin EC 81 MG tablet Take 1 tablet (81 mg total) by mouth daily. 150 tablet 2  . clopidogrel (PLAVIX) 75 MG tablet Take 1 tablet (75 mg total) by mouth daily with breakfast. 90 tablet 1  . fluticasone (FLONASE) 50 MCG/ACT nasal  spray Place 1 spray into both nostrils daily. 1 g 0  . metoprolol succinate (TOPROL-XL) 25 MG 24 hr tablet TAKE 1 TABLET BY MOUTH EVERY DAY 90 tablet 3  . omeprazole (PRILOSEC) 40 MG capsule TAKE 1 CAPSULE BY MOUTH EVERY DAY 90 capsule 2   No facility-administered medications prior to visit.     Allergies:   Diphenhydramine hcl, Protonix [pantoprazole], Ciprofloxacin, Codeine, and Morphine and related   Social History   Socioeconomic History  . Marital status: Married    Spouse name: Not on file  . Number of children: Not on file  . Years of education: Not on file  . Highest education level: Not on file  Occupational History  . Not on file  Tobacco Use  . Smoking status: Former Smoker    Packs/day: 1.00    Years: 10.00    Pack years: 10.00    Types: Cigarettes    Quit date:  10/25/2005    Years since quitting: 14.3  . Smokeless tobacco: Never Used  . Tobacco comment: quit around 2007   Substance and Sexual Activity  . Alcohol use: Yes    Comment: occ  . Drug use: No  . Sexual activity: Not on file  Other Topics Concern  . Not on file  Social History Narrative  . Not on file   Social Determinants of Health   Financial Resource Strain:   . Difficulty of Paying Living Expenses:   Food Insecurity:   . Worried About Charity fundraiser in the Last Year:   . Arboriculturist in the Last Year:   Transportation Needs:   . Film/video editor (Medical):   Marland Kitchen Lack of Transportation (Non-Medical):   Physical Activity:   . Days of Exercise per Week:   . Minutes of Exercise per Session:   Stress:   . Feeling of Stress :   Social Connections:   . Frequency of Communication with Friends and Family:   . Frequency of Social Gatherings with Friends and Family:   . Attends Religious Services:   . Active Member of Clubs or Organizations:   . Attends Archivist Meetings:   Marland Kitchen Marital Status:      Family History:  The patient's family history includes Cancer in her father, mother, and sister; Heart attack in her paternal aunt and paternal grandfather; Hypertension in her father.     ROS:   Please see the history of present illness.    ROS All other systems reviewed and are negative.   PHYSICAL EXAM:   VS:  BP 128/76   Pulse 98   Ht 5\' 6"  (1.676 m)   Wt 144 lb (65.3 kg)   SpO2 98%   BMI 23.24 kg/m     Affect appropriate Healthy:  appears stated age 75: normal Neck supple with no adenopathy JVP normal no bruits no thyromegaly Lungs clear with no wheezing and good diaphragmatic motion Heart:  S1/S2 SEM through TAVR valve no AR  murmur, no rub, gallop or click PMI normal Abdomen: benighn, BS positve, no tenderness, no AAA no bruit.  No HSM or HJR Distal pulses intact with no bruits No edema Neuro non-focal Skin warm and dry No  muscular weakness    Wt Readings from Last 3 Encounters:  03/14/20 144 lb (65.3 kg)  12/19/19 148 lb (67.1 kg)  11/21/19 145 lb 6.4 oz (66 kg)      Studies/Labs Reviewed:   EKG:  SR rate 96 LAD  otherwise normal   Recent Labs: 11/09/2019: ALT 17; B Natriuretic Peptide 28.0 11/14/2019: BUN 14; Creatinine, Ser 0.50; Hemoglobin 12.4; Magnesium 1.3; Platelets 191; Potassium 3.6; Sodium 134   Lipid Panel No results found for: CHOL, TRIG, HDL, CHOLHDL, VLDL, LDLCALC, LDLDIRECT  Additional studies/ records that were reviewed today include:  TAVR OPERATIVE NOTE   Date of Procedure:11/13/2019  Preoperative Diagnosis:Severe Aortic Stenosis   Postoperative Diagnosis:Same   Procedure:   Transcatheter Aortic Valve Replacement - PercutaneousRightTransfemoral Approach Edwards Sapien 3 Ultra THV (size 10mm, model # 9750TFX, serial # [id number])  Co-Surgeons:Bryan Alveria Apley, MDand Sherren Mocha, MD   Anesthesiologist:Kevin Smith Robert, MD  Dala Dock, MD  Pre-operative Echo Findings: ? Severe aortic stenosis ? Normalleft ventricular systolic function  Post-operative Echo Findings: ? Noparavalvular leak ? Normalleft ventricular systolic function  _____________    Echo 12/19/19 IMPRESSIONS  1. Left ventricular ejection fraction, by estimation, is 60 to 65%. The  left ventricle has normal function. The left ventricle has no regional  wall motion abnormalities. There is mild concentric left ventricular  hypertrophy. Left ventricular diastolic  parameters are consistent with Grade II diastolic dysfunction  (pseudonormalization).  2. Right ventricular systolic function is normal. The right ventricular  size is normal. There is mildly elevated pulmonary artery systolic  pressure.  3. The mitral valve is normal in structure and  function. Trivial mitral  valve regurgitation. No evidence of mitral stenosis.  4. Gradients compared to POD 1 are stable (peak 36 -> 34, mean 19 -> 20  mmHg). Trivial paravalvular leak.. The aortic valve has been  repaired/replaced. Aortic valve regurgitation is trivial. No aortic  stenosis is present. There is a 23 mm Edwards Sapien  prosthetic (TAVR) valve present in the aortic position. Procedure Date:  11/13/2019. Echo findings are consistent with normal structure and  function of the aortic valve prosthesis.  5. The inferior vena cava is normal in size with greater than 50%  respiratory variability, suggesting right atrial pressure of 3 mmHg.   ASSESSMENT & PLAN:   Severe AS s/p TAVR: D/c plavix July 2021. SBE prophylaxis Echo 12/19/19 With stable mean gradient 20 mmHg peak 34 mmHg DVI 0.43 and AVR 1.3 cm2 With no PVL f/u echo February 2022   HTN: Well controlled.  Continue current medications and low sodium Dash type diet.    GERD: back on prolosec discussed low carb diet and meal portions   Pulmonary nodules: these are followed closely by Dr. Brock Ra. He recommended another CT scan in 2 years. July 2022   Inappropriate sinus tachycardia:  Started on Toprol 12/19/19 did not take - improved on own check Hb/Hct TSH today   Medication Adjustments/Labs and Tests Ordered: Current medicines are reviewed at length with the patient today.  Concerns regarding medicines are outlined above.  Medication changes, Labs and Tests ordered today are listed in the Patient Instructions below. There are no Patient Instructions on file for this visit.   Signed, Jenkins Rouge, MD  03/14/2020 4:04 PM    [facility] [place], [place], [place]  [address] Phone: [phone]; Fax: [phone]

## 2020-03-13 ENCOUNTER — Other Ambulatory Visit: Payer: Self-pay | Admitting: Physician Assistant

## 2020-03-14 ENCOUNTER — Encounter: Payer: Self-pay | Admitting: Cardiovascular Disease

## 2020-03-14 ENCOUNTER — Other Ambulatory Visit: Payer: Self-pay

## 2020-03-14 ENCOUNTER — Ambulatory Visit: Payer: Federal, State, Local not specified - PPO | Admitting: Cardiovascular Disease

## 2020-03-14 VITALS — BP 128/76 | HR 98 | Ht 66.0 in | Wt 144.0 lb

## 2020-03-14 DIAGNOSIS — R000 Tachycardia, unspecified: Secondary | ICD-10-CM

## 2020-03-14 DIAGNOSIS — Z952 Presence of prosthetic heart valve: Secondary | ICD-10-CM | POA: Diagnosis not present

## 2020-03-14 NOTE — Patient Instructions (Addendum)
Medication Instructions:  Your physician has recommended you make the following change in your medication:  1-STOP Plavix at the end of July 2021  *If you need a refill on your cardiac medications before your next appointment, please call your pharmacy*  Lab Work: Your physician recommends that you have lab work today- TSH, Hemoglobin and Hematocrit.   If you have labs (blood work) drawn today and your tests are completely normal, you will receive your results only by: Marland Kitchen MyChart Message (if you have MyChart) OR . A paper copy in the mail If you have any lab test that is abnormal or we need to change your treatment, we will call you to review the results.  Testing/Procedures: None ordered today.   Follow-Up: At [facility], you and your health needs are our priority.  As part of our continuing mission to provide you with exceptional heart care, we have created designated Provider Care Teams.  These Care Teams include your primary Cardiologist (physician) and Advanced Practice Providers (APPs -  Physician Assistants and Nurse Practitioners) who all work together to provide you with the care you need, when you need it.  We recommend signing up for the patient portal called "MyChart".  Sign up information is provided on this After Visit Summary.  MyChart is used to connect with patients for Virtual Visits (Telemedicine).  Patients are able to view lab/test results, encounter notes, upcoming appointments, etc.  Non-urgent messages can be sent to your provider as well.   To learn more about what you can do with MyChart, go to [email or web address].    Your next appointment:   6 month(s)  The format for your next appointment:   In Person  Provider:   You may see Jenkins Rouge, MD or one of the following Advanced Practice Providers on your designated Care Team:    Truitt Merle, NP  Cecilie Kicks, NP  Kathyrn Drown, NP

## 2020-03-15 LAB — TSH: TSH: 0.702 u[IU]/mL (ref 0.450–4.500)

## 2020-03-15 LAB — HEMATOCRIT: Hematocrit: 35.8 % (ref 34.0–46.6)

## 2020-03-15 LAB — HEMOGLOBIN: Hemoglobin: 12 g/dL (ref 11.1–15.9)

## 2020-05-08 ENCOUNTER — Other Ambulatory Visit: Payer: Self-pay | Admitting: Physician Assistant

## 2020-05-15 MED ORDER — AMLODIPINE BESYLATE 5 MG PO TABS: 5.0000 mg | ORAL_TABLET | Freq: Every day | ORAL | 3 refills | Status: DC

## 2020-07-07 DIAGNOSIS — B349 Viral infection, unspecified: Secondary | ICD-10-CM | POA: Diagnosis not present

## 2020-08-21 DIAGNOSIS — M545 Low back pain, unspecified: Secondary | ICD-10-CM | POA: Diagnosis not present

## 2020-08-21 DIAGNOSIS — L299 Pruritus, unspecified: Secondary | ICD-10-CM | POA: Diagnosis not present

## 2020-09-08 ENCOUNTER — Other Ambulatory Visit: Payer: Self-pay | Admitting: Physician Assistant

## 2020-09-08 DIAGNOSIS — Z952 Presence of prosthetic heart valve: Secondary | ICD-10-CM

## 2020-09-10 ENCOUNTER — Encounter: Payer: Self-pay | Admitting: Cardiovascular Disease

## 2020-09-10 NOTE — Telephone Encounter (Signed)
No note needed 

## 2020-09-11 ENCOUNTER — Other Ambulatory Visit (HOSPITAL_COMMUNITY): Payer: Federal, State, Local not specified - PPO

## 2020-09-18 IMAGING — CR DG CHEST 2V
2 series · 2 of 2 positions shown · non-contrast
Comparison: 02/28/2016

CLINICAL DATA: Aortic stenosis, preoperative evaluation for TAVR;
history renal cell carcinoma post RIGHT nephrectomy, GERD,
hypertension, former smoker

EXAM:
CHEST - 2 VIEW

[w chest pa]
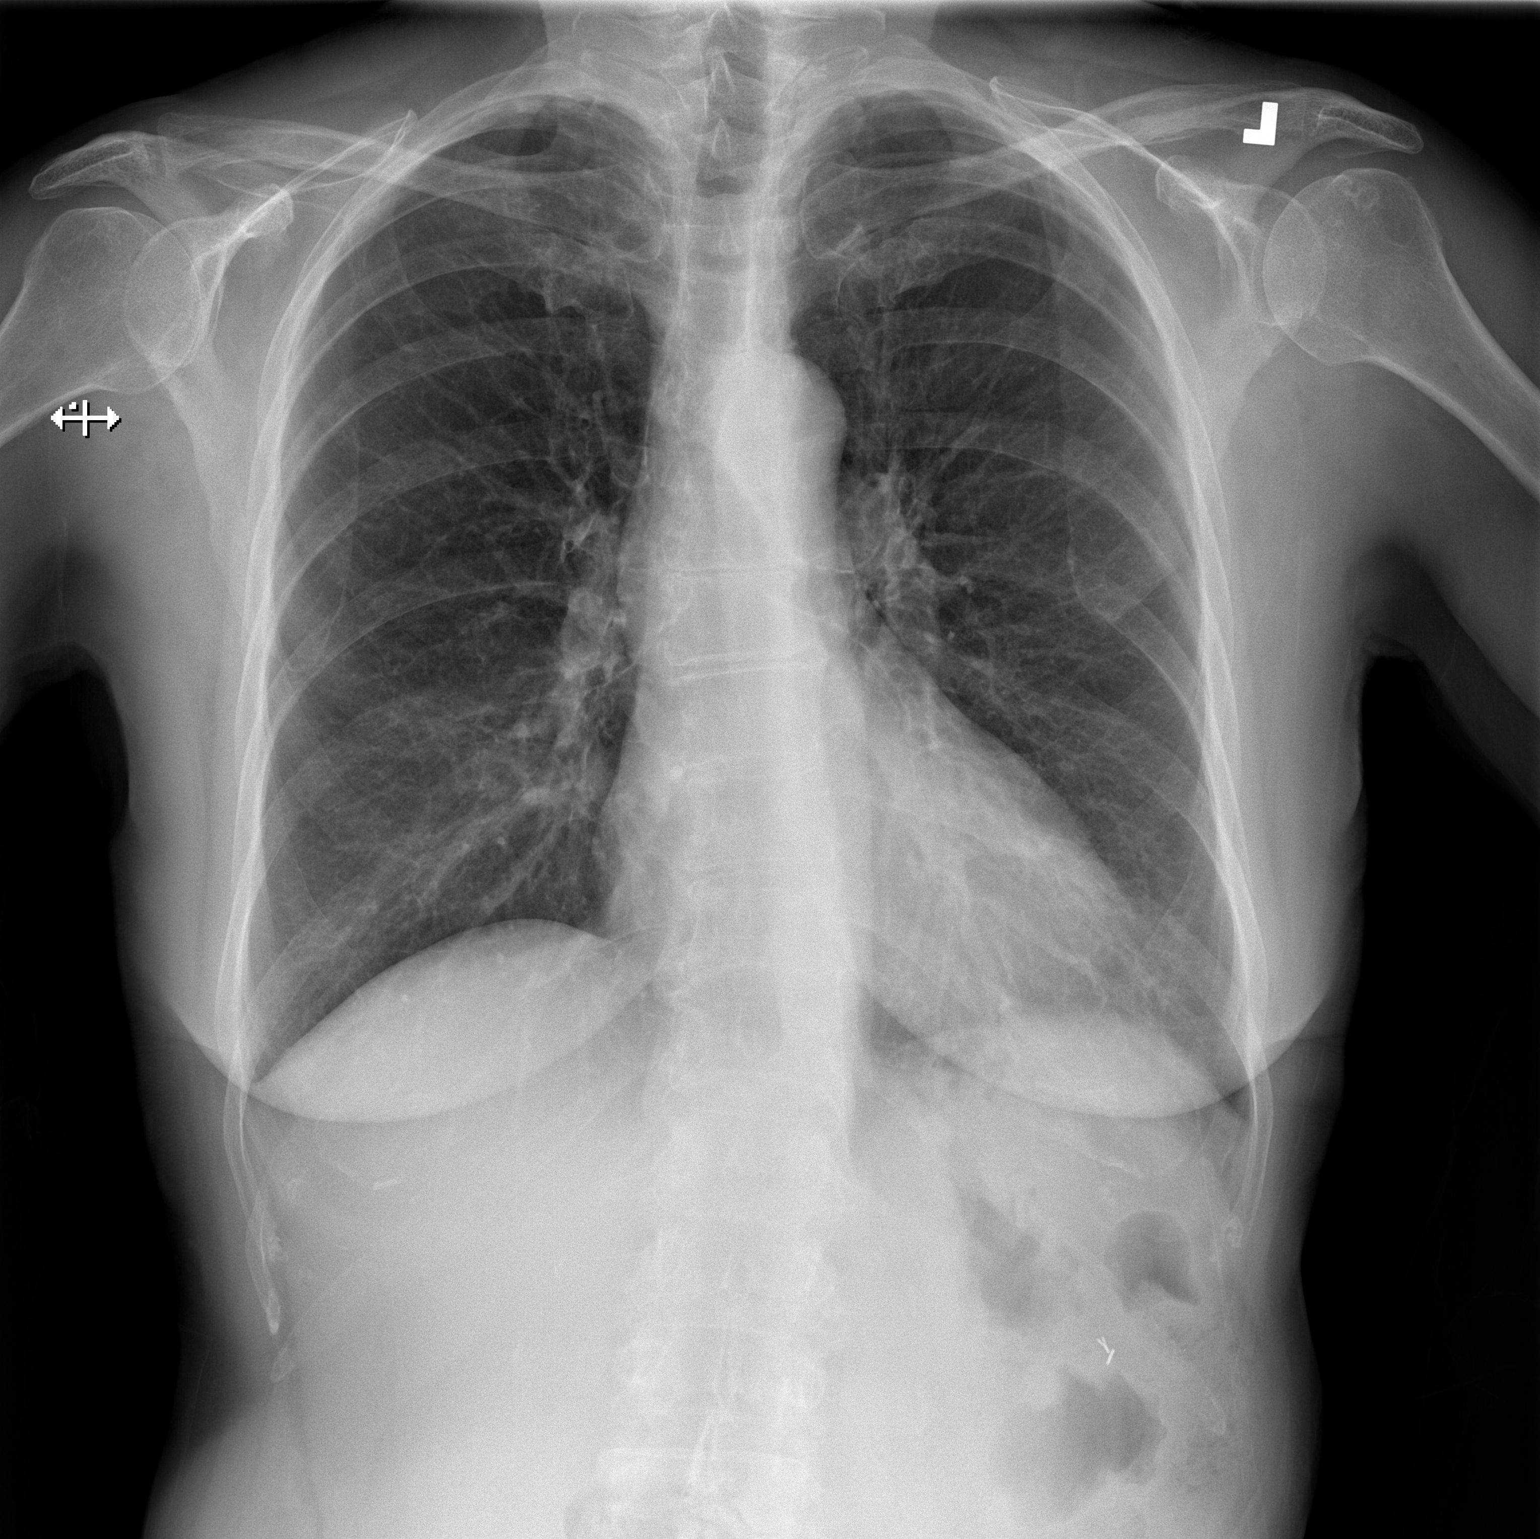

[w chest lat]
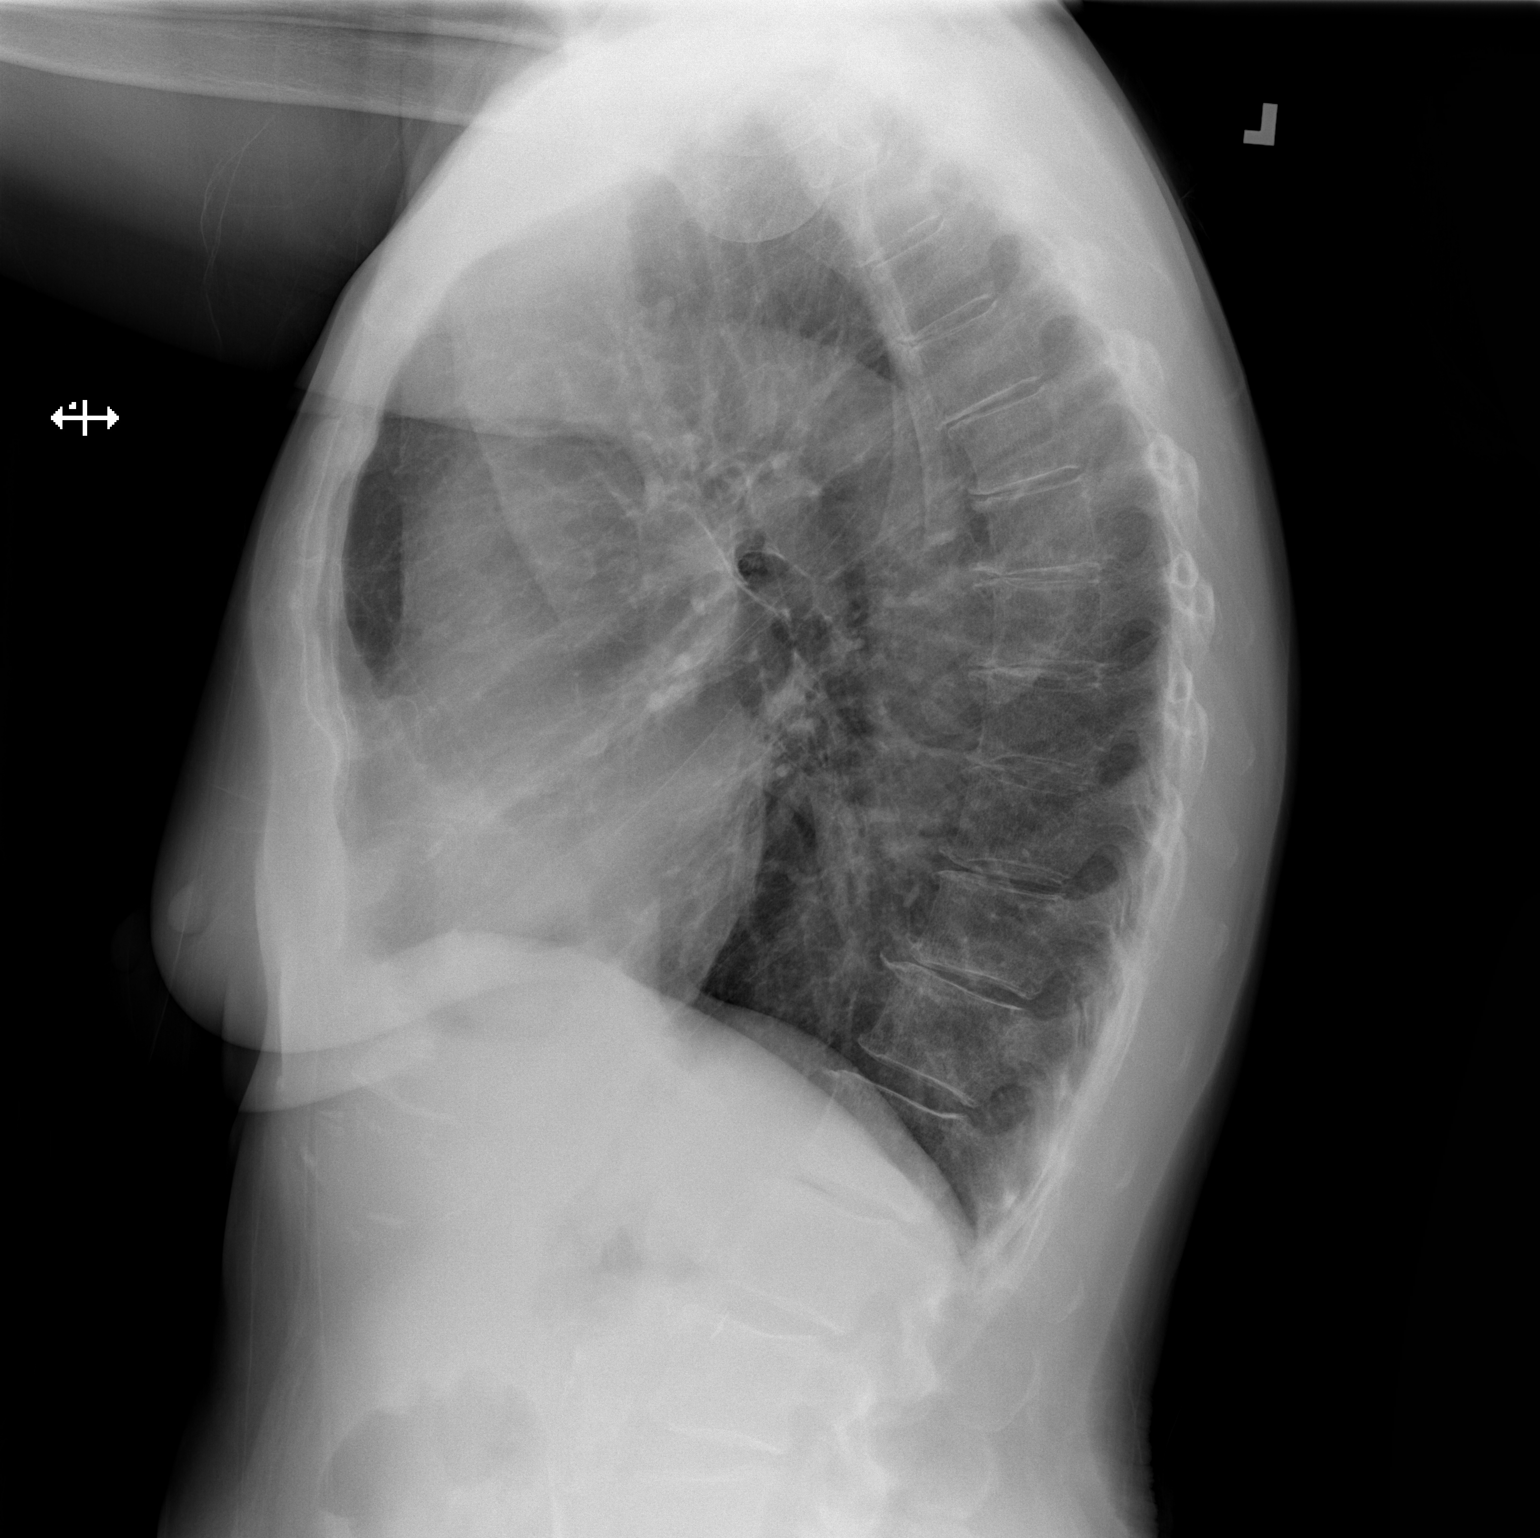

[2 of 2 positions shown; findings below may reference images not displayed]

FINDINGS: Upper normal heart size.

Mediastinal contours and pulmonary vascularity normal.

Emphysematous and bronchitic changes consistent with COPD.

No acute infiltrate, pleural effusion, or pneumothorax.

Bones demineralized.
IMPRESSION: COPD changes without acute abnormality.

## 2020-09-29 ENCOUNTER — Ambulatory Visit: Payer: Federal, State, Local not specified - PPO | Admitting: Cardiovascular Disease

## 2020-10-03 ENCOUNTER — Telehealth: Payer: Self-pay | Admitting: Cardiovascular Disease

## 2020-10-03 ENCOUNTER — Ambulatory Visit (HOSPITAL_COMMUNITY): Payer: Federal, State, Local not specified - PPO | Attending: Cardiology

## 2020-10-03 ENCOUNTER — Other Ambulatory Visit: Payer: Self-pay

## 2020-10-03 DIAGNOSIS — Z952 Presence of prosthetic heart valve: Secondary | ICD-10-CM | POA: Insufficient documentation

## 2020-10-03 LAB — ECHOCARDIOGRAM COMPLETE
AR max vel: 1.27 cm2
AV Area VTI: 1.25 cm2
AV Area mean vel: 1.23 cm2
AV Mean grad: 18.5 mmHg
AV Peak grad: 32.6 mmHg
Ao pk vel: 2.86 m/s
Area-P 1/2: 2.76 cm2
S' Lateral: 2.5 cm

## 2020-10-03 NOTE — Telephone Encounter (Signed)
EF 65% normally functioning TAVR with a mean gradient of 18.5 mmHg (improved from 23mm hg) and no PVL. There is mild dilatation of the aortic root, measuring 38 mm. Stable post tavr study. She will keep her apt with dr Johnsie Cancel in Jan

## 2020-10-03 NOTE — Telephone Encounter (Signed)
Pt aware of echo results and wanted to let Valetta Fuller know that she walked 72 miles last week ./cy

## 2020-10-03 NOTE — Telephone Encounter (Signed)
Pt called in and was returning about there Echo results   Best number  [phone]

## 2020-10-27 NOTE — Progress Notes (Addendum)
Cardiology Office Note    Date:  10/30/2020   ID:  [name] May 14, 1956, MRN [id number]  PCP:  Juluis Rainier, MD  Cardiologist: Charlton Haws, MD / Dr. Excell Seltzer & Dr. Laneta Simmers (TAVR)  CC: Post TAVR   History of Present Illness:  [name] is a 65 y.o. female with a history of HTN, renal cell carcinoma s/p resection, and severe AS s/p TAVR (11/13/19) who presents to clinic for follow up.   TTE 07/2019  marked increase in mean gradient of 77 mmHg and peak gradient of 109 mmHg with a calculated aortic valve area of 0.76 cm. [facility] 08/22/19 showed normal cors and confirmed severe AS.  Underwent successful TAVR with a 23 mm Edwards Sapien 3 THV via the TF approach on 11/13/19. Post operative echo showed EF 65-70%, normally functioning TAVR with mean gradient of 19 mm hg and no PVL. She was discharged on aspirin and plavix.  TTE 12/19/19 stable mean gradient 20 mmHg in setting of tachycardia   Has not taken beta blocker as pulse better with work  TTE 10/03/20 reviewed EF 65% stable mean gradient 18.5 mmHg no PVL DVI 0.36 AVA 1.3 cm2  Fell last week and still has pain in left side of chest and sternum Breathing ok   Walked 72 miles last week    Past Medical History:  Diagnosis Date  . Diverticulitis, colon   . GERD (gastroesophageal reflux disease)   . Hypertension   . Pulmonary nodules    seen on pre TAVR CT, needs follow up   . Renal cancer [facility]) 2007   Surgically removed from right kidney  . S/P TAVR (transcatheter aortic valve replacement)    s/p TAVR w/ a Edwards Sapien 3 Ultra THV via the TF approach on 11/13/19 with Dr. Excell Seltzer and Laneta Simmers.   . Severe aortic stenosis     Past Surgical History:  Procedure Laterality Date  . APPENDECTOMY    . COLON SURGERY    . HERNIA REPAIR    . OVARIAN CYST SURGERY    . RIGHT/LEFT HEART CATH AND CORONARY ANGIOGRAPHY N/A 08/22/2019   Procedure: RIGHT/LEFT HEART CATH AND CORONARY  ANGIOGRAPHY;  Surgeon: Lyn Records, MD;  Location: [facility] INVASIVE CV LAB;  Service: Cardiovascular;  Laterality: N/A;  . TEE WITHOUT CARDIOVERSION N/A 11/13/2019   Procedure: TRANSESOPHAGEAL ECHOCARDIOGRAM (TEE);  Surgeon: Tonny Bollman, MD;  Location: [facility] INVASIVE CV LAB;  Service: Open Heart Surgery;  Laterality: N/A;  . TRANSCATHETER AORTIC VALVE REPLACEMENT, TRANSFEMORAL N/A 11/13/2019   Procedure: TRANSCATHETER AORTIC VALVE REPLACEMENT, TRANSFEMORAL;  Surgeon: Tonny Bollman, MD;  Location: [facility] INVASIVE CV LAB;  Service: Open Heart Surgery;  Laterality: N/A;    Current Medications: Outpatient Medications Prior to Visit  Medication Sig Dispense Refill  . amLODipine (NORVASC) 5 MG tablet Take 1 tablet (5 mg total) by mouth daily. 90 tablet 3  . fluticasone (FLONASE) 50 MCG/ACT nasal spray Place 1 spray into both nostrils daily. 1 g 0  . omeprazole (PRILOSEC) 40 MG capsule TAKE 1 CAPSULE BY MOUTH EVERY DAY 90 capsule 2  . aspirin EC 81 MG tablet Take 1 tablet (81 mg total) by mouth daily. 150 tablet 2  . clopidogrel (PLAVIX) 75 MG tablet TAKE 1 TABLET (75 MG TOTAL) BY  MOUTH DAILY WITH BREAKFAST. 90 tablet 3   No facility-administered medications prior to visit.     Allergies:   Diphenhydramine hcl, Protonix [pantoprazole], Ciprofloxacin, Codeine, Diphenhydramine, and Morphine and related   Social History   Socioeconomic History  . Marital status: Married    Spouse name: Not on file  . Number of children: Not on file  . Years of education: Not on file  . Highest education level: Not on file  Occupational History  . Not on file  Tobacco Use  . Smoking status: Former Smoker    Packs/day: 1.00    Years: 10.00    Pack years: 10.00    Types: Cigarettes    Quit date: 10/25/2005    Years since quitting: 15.0  . Smokeless tobacco: Never Used  . Tobacco comment: quit around 2007   Vaping Use  . Vaping Use: Never used  Substance and Sexual Activity  . Alcohol use: Yes    Comment: occ   . Drug use: No  . Sexual activity: Not on file  Other Topics Concern  . Not on file  Social History Narrative  . Not on file   Social Determinants of Health   Financial Resource Strain: Not on file  Food Insecurity: Not on file  Transportation Needs: Not on file  Physical Activity: Not on file  Stress: Not on file  Social Connections: Not on file     Family History:  The patient's family history includes Cancer in her father, mother, and sister; Heart attack in her paternal aunt and paternal grandfather; Hypertension in her father.     ROS:   Please see the history of present illness.    ROS All other systems reviewed and are negative.   PHYSICAL EXAM:   VS:  BP (!) 144/82   Pulse 94   Ht 5\' 6"  (1.676 m)   Wt 66.1 kg   SpO2 97%   BMI 23.53 kg/m     Affect appropriate Healthy:  appears stated age HEENT: normal Neck supple with no adenopathy JVP normal no bruits no thyromegaly Lungs clear with no wheezing and good diaphragmatic motion Heart:  S1/S2 SEM through TAVR valve no AR  murmur, no rub, gallop or click PMI normal Abdomen: benighn, BS positve, no tenderness, no AAA no bruit.  No HSM or HJR Distal pulses intact with no bruits No edema Neuro non-focal Skin warm and dry No muscular weakness    Wt Readings from Last 3 Encounters:  10/30/20 66.1 kg  03/14/20 65.3 kg  12/19/19 67.1 kg      Studies/Labs Reviewed:   EKG:  SR rate 84 bpm  LAD otherwise normal 10/30/20  Recent Labs: 11/09/2019: ALT 17; B Natriuretic Peptide 28.0 11/14/2019: BUN 14; Creatinine, Ser 0.50; Magnesium 1.3; Platelets 191; Potassium 3.6; Sodium 134 03/14/2020: Hemoglobin 12.0; TSH 0.702   Lipid Panel No results found for: CHOL, TRIG, HDL, CHOLHDL, VLDL, LDLCALC, LDLDIRECT  Additional studies/ records that were reviewed today include:  TAVR OPERATIVE NOTE   Date of Procedure:11/13/2019  Preoperative Diagnosis:Severe Aortic Stenosis   Postoperative  Diagnosis:Same   Procedure:   Transcatheter Aortic Valve Replacement - PercutaneousRightTransfemoral Approach Edwards Sapien 3 Ultra THV (size 41mm, model # 9750TFX, serial # [id number])  Co-Surgeons:Bryan Alveria Apley, MDand Sherren Mocha, MD   Anesthesiologist:Kevin Smith Robert, MD  Dala Dock, MD  Pre-operative Echo Findings: ? Severe aortic stenosis ? Normalleft ventricular systolic function  Post-operative Echo Findings: ? Noparavalvular leak ? Normalleft ventricular systolic function  _____________  Echo 10/03/20   IMPRESSIONS    1. Left ventricular ejection fraction, by estimation, is 65 to 70%. Left  ventricular ejection fraction by 3D volume is 69 %. The left ventricle has  normal function. The left ventricle has no regional wall motion  abnormalities. There is mild concentric  left ventricular hypertrophy. Left ventricular diastolic parameters are  consistent with Grade II diastolic dysfunction (pseudonormalization).  Elevated left ventricular end-diastolic pressure.  2. Right ventricular systolic function is normal. The right ventricular  size is normal. There is normal pulmonary artery systolic pressure.  3. Left atrial size was mildly dilated.  4. Right atrial size was mildly dilated.  5. The mitral valve is normal in structure. Mild mitral valve  regurgitation. No evidence of mitral stenosis.  6. There is a 23 mm Sapien prosthetic, stented (TAVR) valve present in  the aortic position. Aortic valve regurgitation is not visualized. No  aortic stenosis is present. Aortic valve area, by VTI measures 1.25 cm.  Aortic valve mean gradient measures  18.5 mmHg. Aortic valve Vmax measures 2.86 m/s. DI 0.36.  7. Aortic dilatation noted. There is mild dilatation of the aortic root,  measuring 38 mm.  8. The inferior vena cava is normal  in size with greater than 50%  respiratory variability, suggesting right atrial pressure of 3 mmHg.  9. Compared to echo 11/2019, gradients are stable (mean AVG 20>33mmHg). No  perivalvular AI is noted.    ASSESSMENT & PLAN:   Severe AS s/p TAVR: SBE, plavix DC stable mean gradient 18.5 mm TTE 09/2020 no PVL good. She has NYHA class I symptoms. Exercise tolerance   HTN: Well controlled.  Continue current medications and low sodium Dash type diet.    GERD: back on prolosec discussed low carb diet and meal portions   Pulmonary nodules: these are followed closely by Dr. Brock Ra. He recommended another CT scan in 2 years. July 2022   Inappropriate sinus tachycardia:  Improved no longer taking beta blocker TSH/Hct fine in May   Fall:  With rib/chest pain will screen with PA/Lateral CXR to r/o fracture if pain persists for more than another week or two can consider CT  F/U in a year     Signed, Jenkins Rouge, MD  10/30/2020 9:49 AM    [facility] [place], [place], [place]  [address] Phone: [phone]; Fax: [phone]

## 2020-10-30 ENCOUNTER — Encounter: Payer: Self-pay | Admitting: Cardiovascular Disease

## 2020-10-30 ENCOUNTER — Other Ambulatory Visit: Payer: Self-pay

## 2020-10-30 ENCOUNTER — Ambulatory Visit: Payer: Federal, State, Local not specified - PPO | Admitting: Cardiovascular Disease

## 2020-10-30 ENCOUNTER — Ambulatory Visit
Admission: RE | Admit: 2020-10-30 | Discharge: 2020-10-30 | Disposition: A | Discharge status: Home / Self Care | Payer: Federal, State, Local not specified - PPO | Source: Ambulatory Visit | Attending: Cardiovascular Disease | Admitting: Cardiovascular Disease

## 2020-10-30 VITALS — BP 144/82 | HR 94 | Ht 66.0 in | Wt 145.8 lb

## 2020-10-30 DIAGNOSIS — W108XXA Fall (on) (from) other stairs and steps, initial encounter: Secondary | ICD-10-CM

## 2020-10-30 DIAGNOSIS — Z952 Presence of prosthetic heart valve: Secondary | ICD-10-CM

## 2020-10-30 DIAGNOSIS — R002 Palpitations: Secondary | ICD-10-CM

## 2020-10-30 DIAGNOSIS — Z043 Encounter for examination and observation following other accident: Secondary | ICD-10-CM | POA: Diagnosis not present

## 2020-10-30 NOTE — Patient Instructions (Signed)
Medication Instructions:  *If you need a refill on your cardiac medications before your next appointment, please call your pharmacy*   Lab Work: If you have labs (blood work) drawn today and your tests are completely normal, you will receive your results only by: Marland Kitchen MyChart Message (if you have MyChart) OR . A paper copy in the mail If you have any lab test that is abnormal or we need to change your treatment, we will call you to review the results.   Testing/Procedures: A chest x-ray takes a picture of the organs and structures inside the chest, including the heart, lungs, and blood vessels. This test can show several things, including, whether the heart is enlarges; whether fluid is building up in the lungs; and whether pacemaker / defibrillator leads are still in place. Please go to [address] at [facility].  Follow-Up: At [facility], you and your health needs are our priority.  As part of our continuing mission to provide you with exceptional heart care, we have created designated Provider Care Teams.  These Care Teams include your primary Cardiologist (physician) and Advanced Practice Providers (APPs -  Physician Assistants and Nurse Practitioners) who all work together to provide you with the care you need, when you need it.  We recommend signing up for the patient portal called "MyChart".  Sign up information is provided on this After Visit Summary.  MyChart is used to connect with patients for Virtual Visits (Telemedicine).  Patients are able to view lab/test results, encounter notes, upcoming appointments, etc.  Non-urgent messages can be sent to your provider as well.   To learn more about what you can do with MyChart, go to [email or web address].    Your next appointment:   6 month(s)  The format for your next appointment:   In Person  Provider:   You may see Charlton Haws, MD or one of the following Advanced Practice Providers on your designated Care  Team:    Norma Fredrickson, NP  Nada Boozer, NP  Georgie Chard, NP

## 2020-11-12 DIAGNOSIS — R0981 Nasal congestion: Secondary | ICD-10-CM | POA: Diagnosis not present

## 2020-11-12 DIAGNOSIS — Z20822 Contact with and (suspected) exposure to covid-19: Secondary | ICD-10-CM | POA: Diagnosis not present

## 2020-11-13 DIAGNOSIS — R0981 Nasal congestion: Secondary | ICD-10-CM | POA: Diagnosis not present

## 2020-11-18 DIAGNOSIS — J029 Acute pharyngitis, unspecified: Secondary | ICD-10-CM | POA: Diagnosis not present

## 2020-12-21 ENCOUNTER — Other Ambulatory Visit: Payer: Self-pay | Admitting: Physician Assistant

## 2021-04-05 ENCOUNTER — Other Ambulatory Visit: Payer: Self-pay | Admitting: Cardiovascular Disease

## 2021-05-22 ENCOUNTER — Telehealth: Payer: Self-pay

## 2021-05-22 MED ORDER — AMLODIPINE BESYLATE 5 MG PO TABS: 5.0000 mg | ORAL_TABLET | Freq: Every day | ORAL | 1 refills | Status: DC

## 2021-05-22 NOTE — Telephone Encounter (Signed)
Received a voicemail from patient requesting a refill on Norvasc. Patient states she would like for the new rx script to be sent to [facility].   Chart reviewed.   Rx(s) sent to pharmacy electronically. Left note for Cane Savannah to contact [facility] to cancel previous Norvasc.     Left message informing patient that we have sent a new rx to her new pharmacy.

## 2021-06-08 NOTE — Progress Notes (Signed)
Cardiology Office Note    Date:  06/08/2021   ID:  Summah, [place] [birth year], MRN [id number]  PCP:  Leighton Ruff, MD  Cardiologist: Jenkins Rouge, MD / Dr. Burt Knack & Dr. Cyndia Bent (TAVR)  CC: Post TAVR   History of Present Illness:  [name] is a 65 y.o. female with a history of HTN, renal cell carcinoma s/p resection, and severe AS s/p TAVR (11/13/19) who presents to clinic for follow up.   TTE 07/2019  marked increase in mean gradient of 77 mmHg and peak gradient of 109 mmHg with a calculated aortic valve area of 0.76 cm. [facility] 08/22/19 showed normal cors and confirmed severe AS.   Underwent successful TAVR with a 23 mm Edwards Sapien 3 THV via the TF approach on 11/13/19. Post operative echo showed EF 65-70%, normally functioning TAVR with mean gradient of 19 mm hg and no PVL. She was discharged on aspirin and plavix.  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 Burt Knack and Cyndia Bent.    Severe aortic stenosis     Past Surgical History:  Procedure Laterality Date   APPENDECTOMY     COLON SURGERY     HERNIA REPAIR     OVARIAN CYST SURGERY     RIGHT/LEFT HEART CATH AND CORONARY ANGIOGRAPHY N/A 08/22/2019   Procedure: RIGHT/LEFT HEART CATH AND CORONARY  ANGIOGRAPHY;  Surgeon: Belva Crome, MD;  Location: [place] [facility];  Service: Cardiovascular;  Laterality: N/A;   TEE WITHOUT CARDIOVERSION N/A 11/13/2019   Procedure: TRANSESOPHAGEAL ECHOCARDIOGRAM (TEE);  Surgeon: Sherren Mocha, MD;  Location: [place] [facility];  Service: Open Heart Surgery;  Laterality: N/A;   TRANSCATHETER AORTIC VALVE REPLACEMENT, TRANSFEMORAL N/A 11/13/2019   Procedure: TRANSCATHETER AORTIC VALVE REPLACEMENT, TRANSFEMORAL;  Surgeon: Sherren Mocha, MD;  Location: [place] [facility];  Service: Open Heart Surgery;  Laterality: N/A;    Current Medications: Outpatient Medications Prior to Visit  Medication Sig Dispense Refill   amLODipine (NORVASC) 5 MG tablet Take 1 tablet (5 mg total) by mouth daily. 90 tablet 1   fluticasone (FLONASE) 50 MCG/ACT nasal spray Place 1 spray into both nostrils daily. 1 g 0   omeprazole (PRILOSEC) 40 MG capsule TAKE 1 CAPSULE BY MOUTH EVERY DAY 90 capsule 3   No facility-administered medications prior to visit.     Allergies:   Diphenhydramine hcl, Protonix [pantoprazole], Ciprofloxacin, Codeine, Diphenhydramine, and Morphine and related   Social History  Socioeconomic History   Marital status: Married    Spouse name: Not on file   Number of children: Not on file   Years of education: Not on file   Highest education level: Not on file  Occupational History   Not on file  Tobacco Use   Smoking status: Former    Packs/day: 1.00    Years: 10.00    Pack years: 10.00    Types: Cigarettes    Quit date: 10/25/2005    Years since quitting: 15.6   Smokeless tobacco: Never   Tobacco comments:    quit around 2007   Vaping Use   Vaping Use: Never used  Substance and Sexual Activity   Alcohol use: Yes    Comment: occ   Drug use: No   Sexual activity: Not on file  Other Topics Concern   Not on file  Social History Narrative   Not on file   Social Determinants of Health   Financial Resource Strain: Not on file  Food  Insecurity: Not on file  Transportation Needs: Not on file  Physical Activity: Not on file  Stress: Not on file  Social Connections: Not on file     Family History:  The patient's family history includes Cancer in her father, mother, and sister; Heart attack in her paternal aunt and paternal grandfather; Hypertension in her father.     ROS:   Please see the history of present illness.    ROS All other systems reviewed and are negative.   PHYSICAL EXAM:   VS:  There were no vitals taken for this visit.    Affect appropriate Healthy:  appears stated age 85: normal Neck supple with no adenopathy JVP normal no bruits no thyromegaly Lungs clear with no wheezing and good diaphragmatic motion Heart:  S1/S2 SEM through TAVR valve no AR  murmur, no rub, gallop or click PMI normal Abdomen: benighn, BS positve, no tenderness, no AAA no bruit.  No HSM or HJR Distal pulses intact with no bruits No edema Neuro non-focal Skin warm and dry No muscular weakness    Wt Readings from Last 3 Encounters:  10/30/20 66.1 kg  03/14/20 65.3 kg  12/19/19 67.1 kg      Studies/Labs Reviewed:   EKG:  SR rate 84 bpm  LAD otherwise normal 10/30/20  Recent Labs: No results found for requested labs within last 8760 hours.   Lipid Panel No results found for: CHOL, TRIG, HDL, CHOLHDL, VLDL, LDLCALC, LDLDIRECT  Additional studies/ records that were reviewed today include:  TAVR OPERATIVE NOTE     Date of Procedure:                11/13/2019   Preoperative Diagnosis:      Severe Aortic Stenosis    Postoperative Diagnosis:    Same    Procedure:        Transcatheter Aortic Valve Replacement - Percutaneous Right Transfemoral Approach             Edwards Sapien 3 Ultra THV (size 23 mm, model # 9750TFX, serial # [id number])              Co-Surgeons:                        [name], MD and Sherren Mocha, MD     Anesthesiologist:                  Suella Broad, MD  Echocardiographer:               Ena Dawley, MD   Pre-operative Echo Findings: Severe aortic stenosis Normal left ventricular systolic function   Post-operative Echo Findings: No paravalvular leak Normal left ventricular systolic function   _____________     Echo 10/03/20   IMPRESSIONS     1. Left ventricular ejection fraction, by estimation, is 65 to 70%. Left  ventricular ejection fraction by 3D volume is 69 %. The left ventricle has  normal function. The left ventricle has no regional wall motion  abnormalities. There is mild concentric  left ventricular hypertrophy. Left ventricular diastolic parameters are  consistent with Grade II diastolic dysfunction (pseudonormalization).  Elevated left ventricular end-diastolic pressure.   2. Right ventricular systolic function is normal. The right ventricular  size is normal. There is normal pulmonary artery systolic pressure.   3. Left atrial size was mildly dilated.   4. Right atrial size was mildly dilated.   5. The mitral valve is normal in structure. Mild mitral valve  regurgitation. No evidence of mitral stenosis.   6. There is a 23 mm Sapien prosthetic, stented (TAVR) valve present in  the aortic position. Aortic valve regurgitation is not visualized. No  aortic stenosis is present. Aortic valve area, by VTI measures 1.25 cm.  Aortic valve mean gradient measures  18.5 mmHg. Aortic valve Vmax measures 2.86 m/s. DI 0.36.   7. Aortic dilatation noted. There is mild dilatation of the aortic root,  measuring 38 mm.   8. The inferior vena cava is normal in size with greater than 50%  respiratory variability, suggesting right atrial pressure of 3 mmHg.   9. Compared to echo 11/2019, gradients are stable (mean AVG 20>33mHg). No  perivalvular AI is noted.    ASSESSMENT & PLAN:   Severe AS s/p TAVR: SBE, plavix DC stable mean gradient 18.5 mm TTE 09/2020 no PVL good. She has NYHA class I symptoms.with excellent exercise tolerance f/u echo  09/2021    HTN: Well controlled.  Continue current medications and low sodium Dash type diet.     GERD: back on prolosec discussed low carb diet and meal portions    Pulmonary nodules: these are followed closely by Dr. BBrock Ra He recommended another CT scan in 2 years. July 2022   Inappropriate sinus tachycardia:  Improved no longer taking beta blocker TSH/Hct fine in May   Echo :  post TAVR 09/2021   F/U in a year     Signed, PJenkins Rouge MD  06/08/2021 12:50 PM    [facility] [address] [place]  [id number]: [phone] Fax: [phone]

## 2021-06-10 ENCOUNTER — Ambulatory Visit: Payer: Medicare Other | Admitting: Cardiovascular Disease

## 2021-06-10 ENCOUNTER — Encounter: Payer: Self-pay | Admitting: Cardiovascular Disease

## 2021-06-10 ENCOUNTER — Other Ambulatory Visit: Payer: Self-pay

## 2021-06-10 VITALS — BP 152/80 | HR 92 | Ht 66.0 in | Wt 170.0 lb

## 2021-06-10 DIAGNOSIS — R000 Tachycardia, unspecified: Secondary | ICD-10-CM | POA: Diagnosis not present

## 2021-06-10 DIAGNOSIS — Z952 Presence of prosthetic heart valve: Secondary | ICD-10-CM | POA: Diagnosis not present

## 2021-06-10 DIAGNOSIS — I10 Essential (primary) hypertension: Secondary | ICD-10-CM | POA: Diagnosis not present

## 2021-06-10 NOTE — Patient Instructions (Signed)
Medication Instructions:  *If you need a refill on your cardiac medications before your next appointment, please call your pharmacy*  Lab Work: If you have labs (blood work) drawn today and your tests are completely normal, you will receive your results only by: [place] (if you have MyChart) OR A paper copy in the mail If you have any lab test that is abnormal or we need to change your treatment, we will call you to review the results.  Testing/Procedures: Your physician has requested that you have an echocardiogram in December. Echocardiography is a painless test that uses sound waves to create images of your heart. It provides your doctor with information about the size and shape of your heart and how well your heart's chambers and valves are working. This procedure takes approximately one hour. There are no restrictions for this procedure.  Follow-Up: At [facility], you and your health needs are our priority.  As part of our continuing mission to provide you with exceptional heart care, we have created designated Provider Care Teams.  These Care Teams include your primary Cardiologist (physician) and Advanced Practice Providers (APPs -  Physician Assistants and Nurse Practitioners) who all work together to provide you with the care you need, when you need it.  We recommend signing up for the patient portal called "MyChart".  Sign up information is provided on this After Visit Summary.  MyChart is used to connect with patients for Virtual Visits (Telemedicine).  Patients are able to view lab/test results, encounter notes, upcoming appointments, etc.  Non-urgent messages can be sent to your provider as well.   To learn more about what you can do with MyChart, go to [email or web address].    Your next appointment:   6 month(s)  The format for your next appointment:   In Person  Provider:   You may see Jenkins Rouge, MD or one of the following Advanced Practice Providers on your  designated Care Team:   Cecilie Kicks, NP

## 2021-07-06 MED ORDER — OMEPRAZOLE 40 MG PO CPDR: DELAYED_RELEASE_CAPSULE | ORAL | 3 refills | Status: DC

## 2021-10-08 ENCOUNTER — Ambulatory Visit (HOSPITAL_COMMUNITY): Payer: Medicare Other | Attending: Cardiology

## 2021-10-08 ENCOUNTER — Other Ambulatory Visit: Payer: Self-pay

## 2021-10-08 DIAGNOSIS — Z952 Presence of prosthetic heart valve: Secondary | ICD-10-CM

## 2021-10-08 LAB — ECHOCARDIOGRAM COMPLETE
AR max vel: 1.12 cm2
AV Area VTI: 1.2 cm2
AV Area mean vel: 1.09 cm2
AV Mean grad: 19 mmHg
AV Peak grad: 34.6 mmHg
Ao pk vel: 2.94 m/s
Area-P 1/2: 3.99 cm2
S' Lateral: 2.3 cm

## 2021-11-18 ENCOUNTER — Telehealth: Payer: Self-pay | Admitting: Cardiovascular Disease

## 2021-11-18 DIAGNOSIS — B0229 Other postherpetic nervous system involvement: Secondary | ICD-10-CM | POA: Diagnosis not present

## 2021-11-18 DIAGNOSIS — R110 Nausea: Secondary | ICD-10-CM | POA: Diagnosis not present

## 2021-11-18 DIAGNOSIS — R200 Anesthesia of skin: Secondary | ICD-10-CM | POA: Diagnosis not present

## 2021-11-18 DIAGNOSIS — R000 Tachycardia, unspecified: Secondary | ICD-10-CM | POA: Diagnosis not present

## 2021-11-18 NOTE — Telephone Encounter (Signed)
PCP is calling to speak with the dr/Dod of the day. Patient is in their office now

## 2021-11-18 NOTE — Telephone Encounter (Signed)
DOD call from [facility],  RN placed on hold,  call was disconnected.  RN called back placed spoke directly with Olegario Messier.  She expressed that pt is currently in office EKG shows incomplete RBBB.  Pt is asymptomatic.  HR in 120's.  Dr. Gasper Sells spoke with Loma Sousa advised her that will look out for EKG.  Pt has a history of inappropriate sinus tachycardia.  Suggested that pt restart metoprolol 12.5 mg PO BID.  Courtney to order medication.  MD reviewed ekg.

## 2021-11-18 NOTE — Telephone Encounter (Signed)
See phone note below.  Asymptomatic with return to inappropriate sinus tachycardia.  Has TSH and CBC pending today per PCP.  EKG has been faxed over: Sinus tachycardia with left atrial enlargment, iRBBB (QRS duration 102)  Patient has previously been on low dose BB for inapproriate sinus tachycardia.  Will start low dose BB again, has follow up in February.  Rudean Haskell, MD [place] [address] [place], [place] [address] [phone]  12:32 PM

## 2021-11-19 DIAGNOSIS — R200 Anesthesia of skin: Secondary | ICD-10-CM | POA: Diagnosis not present

## 2021-11-29 ENCOUNTER — Other Ambulatory Visit: Payer: Self-pay | Admitting: Cardiovascular Disease

## 2021-12-15 NOTE — Progress Notes (Signed)
Cardiology Office Note    Date:  12/15/2021   ID:  Miski, Feldpausch [birth year], MRN [id number]  PCP:  Leighton Ruff, MD (Inactive)  Cardiologist: Jenkins Rouge, MD / Dr. Burt Knack & Dr. Cyndia Bent (TAVR)  CC: Post TAVR   History of Present Illness:  [name] is a 66 y.o. female with a history of HTN, renal cell carcinoma s/p resection, and severe AS s/p TAVR (11/13/19) who presents to clinic for follow up.   TTE 07/2019  marked increase in mean gradient of 77 mmHg and peak gradient of 109 mmHg with a calculated aortic valve area of 0.76 cm. [facility] 08/22/19 showed normal cors and confirmed severe AS.   Underwent successful TAVR with a 23 mm Edwards Sapien 3 THV via the TF approach on 11/13/19. Post operative echo showed EF 65-70%, normally functioning TAVR with mean gradient of 19 mm hg and no PVL. She was discharged on aspirin and plavix.  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 Watched a nice YouTube tribute to her that was on [employer] news  Started back on beta blocker 11/16/21 as HR started to climb ECG at primary [place] 120 reviewed by Dr Francia Greaves Lopressor 12.5 bid started   She had a bad URI not COVID feels better no heart symptoms Indicates Dr Drema Dallas PA checked labs and they were ok    Past Medical History:  Diagnosis Date   Diverticulitis, colon    GERD (gastroesophageal reflux disease)    Hypertension    Pulmonary nodules    seen on pre TAVR CT, needs follow up    Renal cancer [facility]) 2007   Surgically removed from right kidney   S/P TAVR (transcatheter aortic valve replacement)    s/p TAVR w/ a Edwards Sapien 3 Ultra THV via the TF approach on 11/13/19 with Dr. Burt Knack  and Cyndia Bent.    Severe aortic stenosis     Past Surgical History:  Procedure Laterality Date   APPENDECTOMY     COLON SURGERY     HERNIA REPAIR     OVARIAN CYST SURGERY     RIGHT/LEFT HEART CATH AND CORONARY ANGIOGRAPHY N/A 08/22/2019   Procedure: RIGHT/LEFT HEART CATH AND CORONARY ANGIOGRAPHY;  Surgeon: Belva Crome, MD;  Location: [place] [facility];  Service: Cardiovascular;  Laterality: N/A;   TEE WITHOUT CARDIOVERSION N/A 11/13/2019   Procedure: TRANSESOPHAGEAL ECHOCARDIOGRAM (TEE);  Surgeon: Sherren Mocha, MD;  Location: [place] [facility];  Service: Open Heart Surgery;  Laterality: N/A;   TRANSCATHETER AORTIC VALVE REPLACEMENT, TRANSFEMORAL N/A 11/13/2019   Procedure: TRANSCATHETER AORTIC VALVE REPLACEMENT, TRANSFEMORAL;  Surgeon: Sherren Mocha, MD;  Location: [place] [facility];  Service: Open Heart Surgery;  Laterality: N/A;    Current Medications: Outpatient Medications Prior to Visit  Medication Sig Dispense Refill   amLODipine (NORVASC) 5 MG tablet TAKE ONE TABLET BY MOUTH DAILY 90 tablet 1   fluticasone (FLONASE) 50 MCG/ACT nasal spray Place 1 spray into  both nostrils daily. 1 g 0   omeprazole (PRILOSEC) 40 MG capsule TAKE 1 CAPSULE BY MOUTH EVERY DAY 90 capsule 3   No facility-administered medications prior to visit.     Allergies:   Diphenhydramine hcl, Protonix [pantoprazole], Ciprofloxacin, Codeine, Diphenhydramine, and Morphine and related   Social History   Socioeconomic History   Marital status: Married    Spouse name: Not on file   Number of children: Not on file   Years of education: Not on file   Highest education level: Not on file  Occupational History   Not on file  Tobacco Use   Smoking status: Former    Packs/day: 1.00    Years: 10.00    Pack years: 10.00    Types: Cigarettes    Quit date: 10/25/2005    Years since quitting: 16.1   Smokeless tobacco: Never   Tobacco comments:    quit around 2007   Vaping Use   Vaping Use: Never used   Substance and Sexual Activity   Alcohol use: Yes    Comment: occ   Drug use: No   Sexual activity: Not on file  Other Topics Concern   Not on file  Social History Narrative   Not on file   Social Determinants of Health   Financial Resource Strain: Not on file  Food Insecurity: Not on file  Transportation Needs: Not on file  Physical Activity: Not on file  Stress: Not on file  Social Connections: Not on file     Family History:  The patient's family history includes Cancer in her father, mother, and sister; Heart attack in her paternal aunt and paternal grandfather; Hypertension in her father.     ROS:   Please see the history of present illness.    ROS All other systems reviewed and are negative.   PHYSICAL EXAM:   VS:  There were no vitals taken for this visit.    Affect appropriate Healthy:  appears stated age 25: normal Neck supple with no adenopathy JVP normal no bruits no thyromegaly Lungs clear with no wheezing and good diaphragmatic motion Heart:  S1/S2 SEM through TAVR valve no AR  murmur, no rub, gallop or click PMI normal Abdomen: benighn, BS positve, no tenderness, no AAA no bruit.  No HSM or HJR Distal pulses intact with no bruits No edema Neuro non-focal Skin warm and dry No muscular weakness    Wt Readings from Last 3 Encounters:  06/10/21 170 lb (77.1 kg)  10/30/20 145 lb 12.8 oz (66.1 kg)  03/14/20 144 lb (65.3 kg)      Studies/Labs Reviewed:   EKG:  SR rate 84 bpm  LAD otherwise normal 10/30/20  Recent Labs: No results found for requested labs within last 8760 hours.   Lipid Panel No results found for: CHOL, TRIG, HDL, CHOLHDL, VLDL, LDLCALC, LDLDIRECT  Additional studies/ records that were reviewed today include:  TAVR OPERATIVE NOTE     Date of Procedure:                11/13/2019   Preoperative Diagnosis:      Severe Aortic Stenosis    Postoperative Diagnosis:    Same    Procedure:        Transcatheter Aortic Valve  Replacement - Percutaneous Right Transfemoral Approach             Edwards Sapien 3 Ultra THV (size 23 mm, model # 9750TFX, serial # [id number])  Co-Surgeons:                        [name], MD and Sherren Mocha, MD     Anesthesiologist:                  Suella Broad, MD   Echocardiographer:              Ena Dawley, MD   Pre-operative Echo Findings: Severe aortic stenosis Normal left ventricular systolic function   Post-operative Echo Findings: No paravalvular leak Normal left ventricular systolic function   _____________     Echo 12/115/22  IMPRESSIONS     1. S/P TAVR with mean gradient 19 mmHg, DI 0.38 and trivial AI. Finding  are similar compared to previous (AI is new but trivial).   2. Left ventricular ejection fraction, by estimation, is 70 to 75%. The  left ventricle has hyperdynamic function. The left ventricle has no  regional wall motion abnormalities. There is mild left ventricular  hypertrophy. Left ventricular diastolic  parameters are consistent with Grade I diastolic dysfunction (impaired  relaxation).   3. Right ventricular systolic function is normal. The right ventricular  size is normal.   4. The mitral valve is normal in structure. Trivial mitral valve  regurgitation. No evidence of mitral stenosis.   5. The aortic valve has been repaired/replaced. Aortic valve  regurgitation is trivial. No aortic stenosis is present. There is a 23 mm  Edwards Sapien 3 Ultra THV valve present in the aortic position. Procedure  Date: 11/13/19.   6. Aortic dilatation noted. There is mild dilatation of the ascending  aorta, measuring 39 mm.   7. The inferior vena cava is normal in size with greater than 50%  respiratory variability, suggesting right atrial pressure of 3 mmHg.   ASSESSMENT & PLAN:   Severe AS s/p TAVR: SBE, plavix DC stable mean gradient 6mm TTE 09/2021 no PVL good. She has NYHA class I symptoms.with excellent exercise tolerance  f/u echo 09/2022    HTN: Well controlled.  Continue current medications and low sodium Dash type diet.     GERD: back on prolosec discussed low carb diet and meal portions    Pulmonary nodules: these are followed closely by Dr. Brock Ra. He recommended another CT scan in 2 years. July 2022   Inappropriate sinus tachycardia:  TSH Hct ok with primary back on beta blocker improved will get copy of lab work and continue lopressor   Echo :  post TAVR 09/2022  F/U in a year     Signed, Jenkins Rouge, MD  12/15/2021 10:58 AM    [address], [place], [place]  [address] Phone: [phone]; Fax: [phone]

## 2021-12-18 ENCOUNTER — Other Ambulatory Visit: Payer: Self-pay

## 2021-12-18 ENCOUNTER — Ambulatory Visit: Payer: Medicare Other | Admitting: Cardiovascular Disease

## 2021-12-18 ENCOUNTER — Encounter: Payer: Self-pay | Admitting: Cardiovascular Disease

## 2021-12-18 VITALS — BP 136/72 | HR 94 | Ht 66.0 in | Wt 178.0 lb

## 2021-12-18 DIAGNOSIS — Z952 Presence of prosthetic heart valve: Secondary | ICD-10-CM

## 2021-12-18 DIAGNOSIS — R000 Tachycardia, unspecified: Secondary | ICD-10-CM

## 2021-12-18 NOTE — Patient Instructions (Addendum)
Medication Instructions:  °Your physician recommends that you continue on your current medications as directed. Please refer to the Current Medication list given to you today. ° °*If you need a refill on your cardiac medications before your next appointment, please call your pharmacy* ° °Lab Work: °If you have labs (blood work) drawn today and your tests are completely normal, you will receive your results only by: °MyChart Message (if you have MyChart) OR °A paper copy in the mail °If you have any lab test that is abnormal or we need to change your treatment, we will call you to review the results. ° °Testing/Procedures: °None ordered today. ° °Follow-Up: °At [facility], you and your health needs are our priority.  As part of our continuing mission to provide you with exceptional heart care, we have created designated Provider Care Teams.  These Care Teams include your primary Cardiologist (physician) and Advanced Practice Providers (APPs -  Physician Assistants and Nurse Practitioners) who all work together to provide you with the care you need, when you need it. ° °We recommend signing up for the patient portal called "MyChart".  Sign up information is provided on this After Visit Summary.  MyChart is used to connect with patients for Virtual Visits (Telemedicine).  Patients are able to view lab/test results, encounter notes, upcoming appointments, etc.  Non-urgent messages can be sent to your provider as well.   °To learn more about what you can do with MyChart, go to [email or web address].   ° °Your next appointment:   °6 month(s) ° °The format for your next appointment:   °In Person ° °Provider:   °Peter Nishan, MD { ° ° °

## 2021-12-24 ENCOUNTER — Other Ambulatory Visit: Payer: Self-pay

## 2021-12-24 ENCOUNTER — Emergency Department (HOSPITAL_COMMUNITY)
Admission: EM | Admit: 2021-12-24 | Discharge: 2021-12-25 | Disposition: A | Discharge status: Home / Self Care | Payer: Medicare Other | Attending: Emergency Medicine | Admitting: Emergency Medicine

## 2021-12-24 ENCOUNTER — Emergency Department (HOSPITAL_COMMUNITY): Payer: Medicare Other

## 2021-12-24 ENCOUNTER — Encounter: Payer: Self-pay | Admitting: Cardiovascular Disease

## 2021-12-24 DIAGNOSIS — I10 Essential (primary) hypertension: Secondary | ICD-10-CM | POA: Diagnosis not present

## 2021-12-24 DIAGNOSIS — Z85528 Personal history of other malignant neoplasm of kidney: Secondary | ICD-10-CM | POA: Insufficient documentation

## 2021-12-24 DIAGNOSIS — Z79899 Other long term (current) drug therapy: Secondary | ICD-10-CM | POA: Insufficient documentation

## 2021-12-24 DIAGNOSIS — R232 Flushing: Secondary | ICD-10-CM | POA: Insufficient documentation

## 2021-12-24 DIAGNOSIS — I158 Other secondary hypertension: Secondary | ICD-10-CM | POA: Diagnosis not present

## 2021-12-24 DIAGNOSIS — R42 Dizziness and giddiness: Secondary | ICD-10-CM | POA: Diagnosis not present

## 2021-12-24 DIAGNOSIS — R29818 Other symptoms and signs involving the nervous system: Secondary | ICD-10-CM | POA: Diagnosis not present

## 2021-12-24 LAB — CBC
HCT: 41 % (ref 36.0–46.0)
Hemoglobin: 13.3 g/dL (ref 12.0–15.0)
MCH: 29.1 pg (ref 26.0–34.0)
MCHC: 32.4 g/dL (ref 30.0–36.0)
MCV: 89.7 fL (ref 80.0–100.0)
Platelets: 275 10*3/uL (ref 150–400)
RBC: 4.57 MIL/uL (ref 3.87–5.11)
RDW: 13.6 % (ref 11.5–15.5)
WBC: 8.3 10*3/uL (ref 4.0–10.5)
nRBC: 0 % (ref 0.0–0.2)

## 2021-12-24 LAB — BASIC METABOLIC PANEL
Anion gap: 9 (ref 5–15)
BUN: 12 mg/dL (ref 8–23)
CO2: 26 mmol/L (ref 22–32)
Calcium: 9.4 mg/dL (ref 8.9–10.3)
Chloride: 101 mmol/L (ref 98–111)
Creatinine, Ser: 0.6 mg/dL (ref 0.44–1.00)
GFR, Estimated: >60 mL/min (ref >60)
Glucose, Bld: 115 mg/dL — ABNORMAL HIGH (ref 70–99)
Potassium: 3.9 mmol/L (ref 3.5–5.1)
Sodium: 136 mmol/L (ref 135–145)

## 2021-12-24 LAB — TROPONIN I (HIGH SENSITIVITY): Troponin I (High Sensitivity): 4 ng/L (ref <18)

## 2021-12-24 MED ORDER — METOPROLOL SUCCINATE ER 50 MG PO TB24: 50.0000 mg | ORAL_TABLET | Freq: Every day | ORAL | 3 refills | Status: DC

## 2021-12-24 MED ORDER — AMLODIPINE BESYLATE 10 MG PO TABS: 10.0000 mg | ORAL_TABLET | Freq: Every day | ORAL | 3 refills | Status: DC

## 2021-12-24 NOTE — ED Notes (Signed)
X2 no response °

## 2021-12-24 NOTE — Telephone Encounter (Signed)
Called patient about her mychart message. Patient had not taken her daily dose of amlodipine at that time of elevated BP. Patient stated she took her amlodipine at 9:30 am. Now her BP is 178/102 and HR 104. Patient is also taking Metoprolol Succinate 12.5 mg BID which she took earlier this morning. Patient complaining of headache, denies chest pain or SOB. Encouraged patient to take her other half of her metoprolol and will send a message to Dr. Johnsie Cancel for further advisement. ?

## 2021-12-24 NOTE — ED Provider Triage Note (Signed)
Emergency Medicine Provider Triage Evaluation Note ? ?[name] , a 66 y.o. female  was evaluated in triage.  Pt complains of hypertension, lightheadedness since she woke up this morning.  Highest blood pressure was 171/100.  Denies vision changes, shortness of breath, chest pain.  States face felt flushed.  Currently takes amlodipine.  Was started on metoprolol a few weeks ago, dose was increased today by cardiologist when she told them about her symptoms.  She was informed to come to the ED ? ?Review of Systems  ?Positive: Lightheadedness ?Negative: SOB, chest pain ? ?Physical Exam  ?BP (!) 165/86   Pulse (!) 108   Temp 98.7 ?F (37.1 ?C) (Oral)   Resp 16   Ht 5\' 6"  (1.676 m)   Wt 74.8 kg   SpO2 96%   BMI 26.63 kg/m?  ?Gen:   Awake, no distress   ?Resp:  Normal effort, CTAB ?MSK:   Moves extremities without difficulty  ?Other:  RRR without M/R/G ? ?Medical Decision Making  ?Medically screening exam initiated at 9:58 PM.  Appropriate orders placed.  [name] was informed that the remainder of the evaluation will be completed by another provider, this initial triage assessment does not replace that evaluation, and the importance of remaining in the ED until their evaluation is complete. ? ?Labs, imaging, EKG ?  ?Prince Rome, PA-C ?50/56/97 2217 ? ?

## 2021-12-24 NOTE — ED Triage Notes (Signed)
Pt reported to ED with c/o elevated blood pressure since awakening this AM. Pt states her initial home BP reading was 150/100. Endorses facial redness and feeling "flushed". States she has called cardiologist and changes to medication and blood work ordered for tomorrow AM, but since then pt has experienced emesis. Denies any chest pain, headache or shortness of breath. Gait appears steady and pt does not present with any neurologic deficits at time of triage.  ?

## 2021-12-25 ENCOUNTER — Other Ambulatory Visit: Payer: Medicare Other

## 2021-12-25 ENCOUNTER — Emergency Department (HOSPITAL_COMMUNITY): Payer: Medicare Other

## 2021-12-25 DIAGNOSIS — I10 Essential (primary) hypertension: Secondary | ICD-10-CM | POA: Diagnosis not present

## 2021-12-25 DIAGNOSIS — R29818 Other symptoms and signs involving the nervous system: Secondary | ICD-10-CM | POA: Diagnosis not present

## 2021-12-25 LAB — TROPONIN I (HIGH SENSITIVITY): Troponin I (High Sensitivity): 5 ng/L (ref <18)

## 2021-12-25 MED ORDER — LORAZEPAM 1 MG PO TABS
1.0000 mg | ORAL_TABLET | Freq: Once | ORAL | Status: AC
  Administered 2021-12-25: 1 mg via ORAL
  Filled 2021-12-25: qty 1

## 2021-12-25 NOTE — ED Notes (Signed)
Repeat EKG was done and given to Dr.Miller. ?

## 2021-12-25 NOTE — ED Notes (Signed)
Pt verbalized understanding of discharge instruction; opportunity for questions provided ?

## 2021-12-25 NOTE — Discharge Instructions (Signed)
Thankfully your testing here does not show any acute problems, it does show that you have probably had a very small stroke in the past however what we are seeing on your CT scan is not altogether different than what we have seen on any CT scan or MRI on someone who is older than 68 years old.  If you should develop increasing symptoms such as weakness, numbness, changes in vision or speech or balance I would like for you to return immediately to the emergency department. ? ?Please see your doctor within 2 to 3 days for recheck ?

## 2021-12-25 NOTE — ED Provider Notes (Signed)
[facility] EMERGENCY DEPARTMENT Provider Note  CSN: [id number] Arrival date & time: 12/24/21 2132  Chief Complaint(s) Hypertension and Emesis  HPI [name] is a 66 y.o. female with a past medical history listed below severe aortic stenosis status post TAVR, hypertension here for episode of lightheadedness and flushing noted when she awoke around 7 in the morning yesterday on March 2.  Patient noted that her blood pressure was significantly higher than usual around 170/100s.  She denies recent fevers or infections.  No coughing or congestion.  No visual disturbance, no focal deficits.  No associated chest pain or shortness of breath.   Patient was recently evaluated by her primary care provider due to elevated heart rate about 2 weeks ago and started on low-dose metoprolol.  She saw her cardiologist last week for the sinus tachycardia which had improved on the 25 mg of metoprolol.  They plan to get blood work and follow-up.  Patient called cardiologist office after having these episodes and they instructed her to increase her amlodipine from 5 to 10 mg and to increase her metoprolol from 25 to 50 mg.  This evening after having dinner patient had an episode of nausea and vomiting.  She decided to come in for evaluation. Reports he began feeling a bit better shortly thereafter.  Currently she reports that her flushing has improved, but still feels a bit lightheaded.  Wife and husband deny any other changes in medication.  Patient does take a couple over-the-counter supplements which she has been taken for several years without complication. She denies any alcohol use or illicit drug use.  The history is provided by the patient.   Past Medical History Past Medical History:  Diagnosis Date   Diverticulitis, colon    GERD (gastroesophageal reflux disease)    Hypertension    Pulmonary nodules    seen on pre TAVR CT, needs follow up    Renal cancer [facility]) 2007    Surgically removed from right kidney   S/P TAVR (transcatheter aortic valve replacement)    s/p TAVR w/ a Edwards Sapien 3 Ultra THV via the TF approach on 11/13/19 with Dr. Burt Knack and Cyndia Bent.    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 Authorizing Provider  cetirizine (ZYRTEC) 10 MG tablet Take 10 mg by mouth daily.   Yes [provider]  fluticasone (FLONASE) 50 MCG/ACT nasal spray Place 1 spray into both nostrils daily. Patient taking differently: Place 1 spray into both nostrils daily as needed for allergies. 11/21/19 12/25/21 Yes Eileen Stanford, PA-C  omeprazole (PRILOSEC) 40 MG capsule TAKE 1 CAPSULE BY MOUTH EVERY DAY Patient taking differently: Take 40 mg by mouth daily. 07/06/21  Yes Josue Hector, MD  OVER THE COUNTER MEDICATION Take 1 capsule by mouth daily. Tru niagen   Yes [provider]  OVER THE COUNTER MEDICATION Take 1 tablet by mouth daily at 6 (six) AM. Trans pterostilbene   Yes [provider]  amLODipine (NORVASC) 10 MG tablet Take 1 tablet (10 mg total) by mouth daily. 12/24/21  Josue Hector, MD  amLODipine (NORVASC) 5 MG tablet Take 5 mg by mouth daily. Patient not taking: Reported on 12/25/2021    [provider]  metoprolol succinate (TOPROL-XL) 25 MG 24 hr tablet Take 25 mg by mouth daily. Patient not taking: Reported on 12/25/2021    [provider]  metoprolol succinate (TOPROL-XL) 50 MG 24 hr tablet Take 1 tablet (50 mg total) by mouth daily. Take with or immediately following a meal.  12/24/21   Josue Hector, MD                                                                                                                                    Allergies Diphenhydramine hcl, Protonix [pantoprazole], Ciprofloxacin, Codeine, Diphenhydramine, and Morphine and related  Review of Systems Review of Systems As noted in HPI  Physical Exam Vital Signs  I have reviewed the triage vital signs BP 125/65    Pulse 84    Temp 97.9 F (36.6 C) (Oral)    Resp 16    Ht 5\' 6"  (1.676 m)    Wt 74.8 kg    SpO2 97%    BMI 26.63 kg/m   Physical Exam Vitals reviewed.  Constitutional:      General: She is not in acute distress.    Appearance: She is well-developed. She is not diaphoretic.  HENT:     Head: Normocephalic and atraumatic.     Nose: Nose normal.  Eyes:     General: No scleral icterus.       Right eye: No discharge.        Left eye: No discharge.     Conjunctiva/sclera: Conjunctivae normal.     Pupils: Pupils are equal, round, and reactive to light.  Cardiovascular:     Rate and Rhythm: Normal rate and regular rhythm.     Heart sounds: Murmur heard.  Systolic murmur is present with a grade of 4/6.  Diastolic murmur is present with a grade of 3/4.    No friction rub. No gallop.  Pulmonary:     Effort: Pulmonary effort is normal. No respiratory distress.     Breath sounds: Normal breath sounds. No stridor. No rales.  Abdominal:     General: There is no distension.     Palpations: Abdomen is soft.     Tenderness: There is no abdominal tenderness.  Musculoskeletal:        General: No tenderness.     Cervical back: Normal range of motion and neck supple.  Skin:    General: Skin is warm and dry.     Findings: No erythema or rash.     Comments: Flushing to cheeks (improved), but mostly earlobes.  Neurological:     Mental Status: She is alert and oriented to person, place, and time.     Comments: Mental Status:  Alert and oriented to person, place, and time.  Attention  and concentration normal.  Speech clear.  Recent memory is intact  Cranial Nerves:  II Visual Fields: Intact to confrontation. Visual fields intact. III, IV, VI: Pupils equal and reactive to light and near. Full eye movement without nystagmus  V Facial Sensation: Normal. No weakness of masticatory muscles  VII: No facial weakness or asymmetry  VIII Auditory Acuity: Grossly normal  IX/X: The uvula is midline; the palate elevates symmetrically  XI: Normal sternocleidomastoid and trapezius strength  XII: The tongue is midline. No atrophy or fasciculations.   Motor System: Muscle Strength: 5/5 and symmetric in the upper and lower extremities.   Muscle Tone: Tone and muscle bulk are normal in the upper and lower extremities.  Coordination: slight dysmetria with FtN on left.  Sensation: Intact to light touch. Negative Romberg test.  Gait: Routine gait at baseline per pt and husband, but tandem gait unstable     ED Results and Treatments Labs (all labs ordered are listed, but only abnormal results are displayed) Labs Reviewed  BASIC METABOLIC PANEL - Abnormal; Notable for the following components:      Result Value   Glucose, Bld 115 (*)    All other components within normal limits  CBC  TROPONIN I (HIGH SENSITIVITY)  TROPONIN I (HIGH SENSITIVITY)                                                                                                                         EKG  EKG Interpretation  Date/Time:  Thursday December 24 2021 22:02:22 EST Ventricular Rate:  104 PR Interval:  150 QRS Duration: 82 QT Interval:  334 QTC Calculation: 439 R Axis:   -40 Text Interpretation: Sinus tachycardia Possible Left atrial enlargement Left axis deviation Cannot rule out Anterior infarct , age undetermined Abnormal ECG When compared with ECG of 14-Nov-2019 07:36, PREVIOUS ECG IS PRESENT Confirmed by Addison Lank [phone]) on 12/25/2021 5:24:33 AM       Radiology DG Chest 2 View  Result Date:  12/24/2021 CLINICAL DATA:  Dizziness EXAM: CHEST - 2 VIEW COMPARISON:  10/30/2020 FINDINGS: The heart size and mediastinal contours are within normal limits. Both lungs are clear. The visualized skeletal structures are unremarkable. Status post TAVR. IMPRESSION: No active cardiopulmonary disease. Electronically Signed   By: Ulyses Jarred M.D.   On: 12/24/2021 23:06   CT Head Wo Contrast  Result Date: 12/25/2021 CLINICAL DATA:  Neuro deficit.  Stroke suspected. EXAM: CT HEAD WITHOUT CONTRAST TECHNIQUE: Contiguous axial images were obtained from the base of the skull through the vertex without intravenous contrast. RADIATION DOSE REDUCTION: This exam was performed according to the departmental dose-optimization program which includes automated exposure control, adjustment of the mA and/or kV according to patient size and/or use of iterative reconstruction technique. COMPARISON:  04/14/2004 FINDINGS: Brain: There is no evidence for acute hemorrhage, hydrocephalus, mass lesion, or abnormal extra-axial fluid collection. No definite CT evidence for acute infarction. Vascular: No hyperdense vessel or unexpected calcification. Skull: No evidence for  fracture. No worrisome lytic or sclerotic lesion. Sinuses/Orbits: Chronic mucosal thickening noted right maxillary sinus. Remaining visualized paranasal sinuses are clear. Visualized portions of the globes and intraorbital fat are unremarkable. Other: None. IMPRESSION: 1. No acute intracranial abnormality. 2. Chronic right maxillary sinus disease. Electronically Signed   By: Misty Stanley M.D.   On: 12/25/2021 07:41    Pertinent labs & imaging results that were available during my care of the patient were reviewed by me and considered in my medical decision making (see MDM for details).  Medications Ordered in ED Medications  LORazepam (ATIVAN) tablet 1 mg (1 mg Oral Given 12/25/21 0756)                                                                                                                                      Procedures Procedures  (including critical care time)  Medical Decision Making / ED Course    Complexity of Problem:  Co-morbidities/SDOH that complicate the patient evaluation/care: History of renal cancer status post resection. Pulmonary nodules  Additional history obtained: Cardiology note on 2/24 detailed above  Patient's presenting problem/concern and DDX listed below: Lightheadedness/flushing In setting of HTN HTN disequilibrium, stroke, intracranial mass, atypical ACS, metabolic or electrolyte derangements. Possible drug interaction, but less likely.     Complexity of Data:   Cardiac Monitoring: no  Laboratory Tests ordered listed below with my independent interpretation: CBC without leukocytosis or anemia.   No significant electrolyte derangements or renal sufficiency.  EKG w/o acute ischemic changes, dysrhythmias or blocks. Serial trops negative   Imaging Studies ordered listed below with my independent interpretation: On my read of the chest x-ray, there was no evidence suggestive of pneumonia, pneumothorax, pneumomediastinum, pulmonary edema concerning for new or exacerbation of heart failure, abnormal contour of the mediastinum to suggest dissection, and no evidence of acute injuries. CT head w/o obvious mass effect or stroke Will get MRI to rule out stroke     ED Course:    Hospitalization Considered:  yes  Assessment, Intervention, and Reassessment: Lightheadedness/flushing In the setting of HTN Not consistent with ACS No obvious mass effect or stroke on my read of the CT but radiology read still pending Regardless we will get MRI to rule out stroke. If negative, patient would be stable for discharge home. She has close follow up with cardiology. Patient care turned over to oncoming provider. Patient case and results discussed in detail; please see their note for further ED managment.   Final Clinical  Impression(s) / ED Diagnoses Final diagnoses:  Other secondary hypertension  Lightheadedness  Flushing           This chart was dictated using voice recognition software.  Despite best efforts to proofread,  errors can occur which can change the documentation meaning.    Fatima Blank, MD 12/25/21 [phone]

## 2021-12-25 NOTE — ED Provider Notes (Signed)
I have spoken with this patient at the bedside, she has gone over her symptoms with me and states that she is completely asymptomatic and in fact on my exam she has absolutely no symptoms either subjectively, objectively or on her neurologic exam.  I have reviewed the MRI findings with her including the small area of prior stroke, this does not seem to be acute.  She wants to go home and I think this is reasonable, she states she is going to follow-up in the outpatient setting which is also reasonable.  She understands indications for return.  Stable for discharge ?  ?Noemi Chapel, MD ?12/25/21 1010 ? ?

## 2021-12-31 NOTE — Progress Notes (Signed)
Office Visit    Patient Name: JESSIE COWHER Date of Encounter: 01/01/2022  PCP:  Leighton Ruff, MD (Inactive)   [place]  Cardiologist:  Jenkins Rouge, MD  Advanced Practice Provider:  No care team member to display Electrophysiologist:  None   Chief Complaint    BOBBYE PETTI is a 66 y.o. female with a hx of hypertension, renal cell carcinoma status postresection, and severe aortic stenosis status post TAVR (11/13/2019) presents today for 1 week follow-up appointment.  TTE October 2020 with marked increase in mean gradient of 77 mmHg and peak gradient of 109 mmHg with a calculated aortic valve area of 0.76 cm.  [facility] 07/2019 showed normal coronaries and confirm severe AS.  Underwent successful TAVR on 11/13/2019 (TF approach).  Postoperative echocardiogram showed EF 65 to 70%, normally functioning TAVR with mean gradient 19 mmHg with no PVL.  Discharged on Plavix/aspirin.  TTE 12/19/2019 stable mean gradient 20 mmHg in the setting of tachycardia.  Follow-up echocardiogram 09/2020 with EF 65%, stable mean gradient 18.5 mmHg, no PVL, AVA 1.3 cm.  More recent echocardiogram 10/08/2021 with EF 70%, mean gradient 19, peak 34 mmHg, AVA 1.1 cm  She was last seen by Dr. Johnsie Cancel 12/18/2021.  She was started back on a beta-blocker January 2023 as heart rate started to climb.  She had had a history of a bad URI but not COVID.  No heart related symptoms.  She called in the office a few days ago and stated that she was lightheaded and a little bit nauseous.  Blood pressures were 143/91 and then 149/93.  She began taking an increased dose of metoprolol and amlodipine.  She was recently in the ED on March 2 with blood pressure 173/101. With medication changes it seems like this has improved.   Today, she states that she is still having some lightheadedness especially with changing positions and first thing in the morning.  She showed me her blood pressure and heart rate  log today which showed systolic blood pressure in the 120s to 130s.  Heart rate in the 70s to 80s.  These are all good numbers and should not be causing her symptoms.  She does tell me that she takes her blood pressure about midday and I suggested taking it first thing in the morning before any medication when she is having the most symptoms and send these values to me via MyChart.  We also reviewed orthostatic hypotension possibility and I asked her to increase her hydration and watch her sodium content.  She has not had any lower extremity edema.  She just endorses not feeling like herself and having any energy.  Otherwise, she is not having any other cardiac symptoms.  Reports no shortness of breath nor dyspnea on exertion. Reports no chest pain, pressure, or tightness. No edema, orthopnea, PND. Reports no palpitations.    Past Medical History    Past Medical History:  Diagnosis Date   Diverticulitis, colon    GERD (gastroesophageal reflux disease)    Hypertension    Pulmonary nodules    seen on pre TAVR CT, needs follow up    Renal cancer [facility]) 2007   Surgically removed from right kidney   S/P TAVR (transcatheter aortic valve replacement)    s/p TAVR w/ a Edwards Sapien 3 Ultra THV via the TF approach on 11/13/19 with Dr. Burt Knack and Cyndia Bent.    Severe aortic stenosis    Past Surgical History:  Procedure Laterality Date  APPENDECTOMY     COLON SURGERY     HERNIA REPAIR     OVARIAN CYST SURGERY     RIGHT/LEFT HEART CATH AND CORONARY ANGIOGRAPHY N/A 08/22/2019   Procedure: RIGHT/LEFT HEART CATH AND CORONARY ANGIOGRAPHY;  Surgeon: Belva Crome, MD;  Location: [place] [facility];  Service: Cardiovascular;  Laterality: N/A;   TEE WITHOUT CARDIOVERSION N/A 11/13/2019   Procedure: TRANSESOPHAGEAL ECHOCARDIOGRAM (TEE);  Surgeon: Sherren Mocha, MD;  Location: [place] CV LAB;  Service: Open Heart Surgery;  Laterality: N/A;   TRANSCATHETER AORTIC VALVE REPLACEMENT, TRANSFEMORAL N/A  11/13/2019   Procedure: TRANSCATHETER AORTIC VALVE REPLACEMENT, TRANSFEMORAL;  Surgeon: Sherren Mocha, MD;  Location: [place] CV LAB;  Service: Open Heart Surgery;  Laterality: N/A;    Allergies  Allergies  Allergen Reactions   Diphenhydramine Hcl     loopy   Protonix [Pantoprazole] Nausea And Vomiting    Nausea, headache, heightened senses.    Ciprofloxacin Itching   Codeine Itching   Diphenhydramine Other (See Comments)   Morphine And Related Nausea Only    Anxiety and Nausea      EKGs/Labs/Other Studies Reviewed:   The following studies were reviewed today:  Echocardiogram 10/08/2021  IMPRESSIONS     1. S/P TAVR with mean gradient 19 mmHg, DI 0.38 and trivial AI. Finding  are similar compared to previous (AI is new but trivial).   2. Left ventricular ejection fraction, by estimation, is 70 to 75%. The  left ventricle has hyperdynamic function. The left ventricle has no  regional wall motion abnormalities. There is mild left ventricular  hypertrophy. Left ventricular diastolic  parameters are consistent with Grade I diastolic dysfunction (impaired  relaxation).   3. Right ventricular systolic function is normal. The right ventricular  size is normal.   4. The mitral valve is normal in structure. Trivial mitral valve  regurgitation. No evidence of mitral stenosis.   5. The aortic valve has been repaired/replaced. Aortic valve  regurgitation is trivial. No aortic stenosis is present. There is a 23 mm  Edwards Sapien 3 Ultra THV valve present in the aortic position. Procedure  Date: 11/13/19.   6. Aortic dilatation noted. There is mild dilatation of the ascending  aorta, measuring 39 mm.   7. The inferior vena cava is normal in size with greater than 50%  respiratory variability, suggesting right atrial pressure of 3 mmHg.   EKG:  EKG is not ordered today.  EKG from 11/18/2021 reviewed.  Sinus tachycardia.  Recent Labs: 12/24/2021: BUN 12; Creatinine, Ser 0.60;  Hemoglobin 13.3; Platelets 275; Potassium 3.9; Sodium 136  Recent Lipid Panel No results found for: CHOL, TRIG, HDL, CHOLHDL, VLDL, LDLCALC, LDLDIRECT   Home Medications   Current Meds  Medication Sig   amLODipine (NORVASC) 10 MG tablet Take 1 tablet (10 mg total) by mouth daily.   cetirizine (ZYRTEC) 10 MG tablet Take 10 mg by mouth daily.   fluticasone (FLONASE) 50 MCG/ACT nasal spray Place 1 spray into both nostrils as needed for allergies or rhinitis.   metoprolol succinate (TOPROL-XL) 50 MG 24 hr tablet Take 1 tablet (50 mg total) by mouth daily. Take with or immediately following a meal.   omeprazole (PRILOSEC) 40 MG capsule TAKE 1 CAPSULE BY MOUTH EVERY DAY   OVER THE COUNTER MEDICATION Take 1 capsule by mouth daily. Tru niagen   OVER THE COUNTER MEDICATION Take 1 tablet by mouth daily at 6 (six) AM. Trans pterostilbene     Review of Systems  All other systems reviewed and are otherwise negative except as noted above.  Physical Exam    VS:  BP 130/70 (BP Location: Left Arm, Patient Position: Sitting, Cuff Size: Normal)    Pulse 90    Ht '5\' 6"'$  (1.676 m)    Wt 178 lb 6.4 oz (80.9 kg)    SpO2 96%    BMI 28.79 kg/m  , BMI Body mass index is 28.79 kg/m.  Wt Readings from Last 3 Encounters:  01/01/22 178 lb 6.4 oz (80.9 kg)  12/24/21 165 lb (74.8 kg)  12/18/21 178 lb (80.7 kg)     GEN: Well nourished, well developed, in no acute distress. HEENT: normal. Neck: Supple, no JVD, carotid bruits, or masses. Cardiac: RRR, no murmurs, rubs, or gallops. No clubbing, cyanosis, edema.  Radials/PT 2+ and equal bilaterally.  Respiratory:  Respirations regular and unlabored, clear to auscultation bilaterally. GI: Soft, nontender, nondistended. MS: No deformity or atrophy. Skin: Warm and dry, no rash. Neuro:  Strength and sensation are intact. Psych: Normal affect.  Assessment & Plan    Severe AS status post TAVR -echocardiogram 10/08/2021 with EF 70%, mean gradient 19, peak 34  mmHg, AVA 1.1 cm -Recommend Echo 09/2022  Hypertension -Much better controlled with recent medication adjustment of increasing Norvasc to 10 mg and metoprolol succinate to 50 mg. -Still having some nausea and lightheadedness after recent blood pressure medication adjustment -Full work-up done in the ED including MRI, CT scan of the head, troponin, and labs. -TSH and Mag ordered today -Blood pressure log today reveals SBP 120s to 130s with heart rate 70s to 80s which is in the normal range -Encouraged patient to increase hydration and take her blood pressure first thing in the morning when she is feeling the worst and send these values to me via MyChart  Pulmonary nodules -Followed by Dr. Lamonte Sakai  Sinus tachycardia -HR 90 today -At home 70s-80s  Disposition: Follow up 2-3 weeks with Jenkins Rouge, MD or APP.  Signed, Elgie Collard, PA-C 01/01/2022, 9:35 AM [facility]

## 2022-01-01 ENCOUNTER — Encounter: Payer: Self-pay | Admitting: Physician Assistant

## 2022-01-01 ENCOUNTER — Other Ambulatory Visit: Payer: Self-pay

## 2022-01-01 ENCOUNTER — Ambulatory Visit: Payer: Medicare Other | Admitting: Physician Assistant

## 2022-01-01 VITALS — BP 130/70 | HR 90 | Ht 66.0 in | Wt 178.4 lb

## 2022-01-01 DIAGNOSIS — I350 Nonrheumatic aortic (valve) stenosis: Secondary | ICD-10-CM | POA: Diagnosis not present

## 2022-01-01 DIAGNOSIS — K219 Gastro-esophageal reflux disease without esophagitis: Secondary | ICD-10-CM

## 2022-01-01 DIAGNOSIS — I10 Essential (primary) hypertension: Secondary | ICD-10-CM | POA: Diagnosis not present

## 2022-01-01 DIAGNOSIS — R918 Other nonspecific abnormal finding of lung field: Secondary | ICD-10-CM | POA: Diagnosis not present

## 2022-01-01 DIAGNOSIS — R000 Tachycardia, unspecified: Secondary | ICD-10-CM

## 2022-01-01 LAB — TSH: TSH: 1.68 u[IU]/mL (ref 0.450–4.500)

## 2022-01-01 LAB — MAGNESIUM: Magnesium: 1.4 mg/dL — ABNORMAL LOW (ref 1.6–2.3)

## 2022-01-01 NOTE — Patient Instructions (Addendum)
Medication Instructions:  ?Your physician recommends that you continue on your current medications as directed. Please refer to the Current Medication list given to you today.  ?*If you need a refill on your cardiac medications before your next appointment, please call your pharmacy* ? ? ?Lab Work: ?Magnesium and TSH today ?If you have labs (blood work) drawn today and your tests are completely normal, you will receive your results only by: ?MyChart Message (if you have MyChart) OR ?A paper copy in the mail ?If you have any lab test that is abnormal or we need to change your treatment, we will call you to review the results. ? ? ?Follow-Up: ?At [facility], you and your health needs are our priority.  As part of our continuing mission to provide you with exceptional heart care, we have created designated Provider Care Teams.  These Care Teams include your primary Cardiologist (physician) and Advanced Practice Providers (APPs -  Physician Assistants and Nurse Practitioners) who all work together to provide you with the care you need, when you need it. ? ? ?Your next appointment:   ?2-3 week(s) ? ?The format for your next appointment:   ?In Person ? ?Provider:   ?Jenkins Rouge, MD or APP ? ?Other Instructions ?1.Try to stay hydrated, drink at least 64 oz of water a day ?2.Check you blood pressure daily first thing in the morning for one week and send the readings through your MyChart ? ? ?Orthostatic Hypotension ?Blood pressure is a measurement of how strongly, or weakly, your circulating blood is pressing against the walls of your arteries. Orthostatic hypotension is a drop in blood pressure that can happen when you change positions, such as when you go from lying down to standing. ?Arteries are blood vessels that carry blood from your heart throughout your body. When blood pressure is too low, you may not get enough blood to your brain or to the rest of your organs. Orthostatic hypotension can cause  light-headedness, sweating, rapid heartbeat, blurred vision, and fainting. These symptoms require further investigation into the cause. ?What are the causes? ?Orthostatic hypotension can be caused by many things, including: ?Sudden changes in posture, such as standing up quickly after you have been sitting or lying down. ?Loss of blood (anemia) or loss of body fluids (dehydration). ?Heart problems, neurologic problems, or hormone problems. ?Pregnancy. ?Aging. The risk for this condition increases as you get older. ?Severe infection (sepsis). ?Certain medicines, such as medicines for high blood pressure or medicines that make the body lose excess fluids (diuretics). ?What are the signs or symptoms? ?Symptoms of this condition may include: ?Weakness, light-headedness, or dizziness. ?Sweating. ?Blurred vision. ?Tiredness (fatigue). ?Rapid heartbeat. ?Fainting, in severe cases. ?How is this diagnosed? ?This condition is diagnosed based on: ?Your symptoms and medical history. ?Your blood pressure measurements. Your health care provider will check your blood pressure when you are: ?Lying down. ?Sitting. ?Standing. ?A blood pressure reading is recorded as two numbers, such as "120 over 80" (or 120/80). The first ("top") number is called the systolic pressure. It is a measure of the pressure in your arteries as your heart beats. The second ("bottom") number is called the diastolic pressure. It is a measure of the pressure in your arteries when your heart relaxes between beats. Blood pressure is measured in a unit called mmHg. Healthy blood pressure for most adults is 120/80 mmHg. Orthostatic hypotension is defined as a 20 mmHg drop in systolic pressure or a 10 mmHg drop in diastolic pressure within 3  minutes of standing. ?Other information or tests that may be used to diagnose orthostatic hypotension include: ?Your other vital signs, such as your heart rate and temperature. ?Blood tests. ?An electrocardiogram (ECG) or  echocardiogram. ?A Holter monitor. This is a device you wear that records your heart rhythm continuously, usually for 24-48 hours. ?Tilt table test. For this test, you will be safely secured to a table that moves you from a lying position to an upright position. Your heart rhythm and blood pressure will be monitored during the test. ?How is this treated? ?This condition may be treated by: ?Changing your diet. This may involve eating more salt (sodium) or drinking more water. ?Changing the dosage of certain medicines you are taking that might be lowering your blood pressure. ?Correcting the underlying reason for the orthostatic hypotension. ?Wearing compression stockings. ?Taking medicines to raise your blood pressure. ?Avoiding actions that trigger symptoms. ?Follow these instructions at home: ?Medicines ?Take over-the-counter and prescription medicines only as told by your health care provider. ?Follow instructions from your health care provider about changing the dosage of your current medicines, if this applies. ?Do not stop or adjust any of your medicines on your own. ?Eating and drinking ? ?Drink enough fluid to keep your urine pale yellow. ?Eat extra salt only as directed. Do not add extra salt to your diet unless advised by your health care provider. ?Eat frequent, small meals. ?Avoid standing up suddenly after eating. ?General instructions ? ?Get up slowly from lying down or sitting positions. This gives your blood pressure a chance to adjust. ?Avoid hot showers and excessive heat as directed by your health care provider. ?Engage in regular physical activity as directed by your health care provider. ?If you have compression stockings, wear them as told. ?Keep all follow-up visits. This is important. ?Contact a health care provider if: ?You have a fever for more than 2-3 days. ?You feel more thirsty than usual. ?You feel dizzy or weak. ?Get help right away if: ?You have chest pain. ?You have a fast or  irregular heartbeat. ?You become sweaty or feel light-headed. ?You feel short of breath. ?You faint. ?You have any symptoms of a stroke. "BE FAST" is an easy way to remember the main warning signs of a stroke: ?B - Balance. Signs are dizziness, sudden trouble walking, or loss of balance. ?E - Eyes. Signs are trouble seeing or a sudden change in vision. ?F - Face. Signs are sudden weakness or numbness of the face, or the face or eyelid drooping on one side. ?A - Arms. Signs are weakness or numbness in an arm. This happens suddenly and usually on one side of the body. ?S - Speech. Signs are sudden trouble speaking, slurred speech, or trouble understanding what people say. ?T - Time. Time to call emergency services. Write down what time symptoms started. ?You have other signs of a stroke, such as: ?A sudden, severe headache with no known cause. ?Nausea or vomiting. ?Seizure. ?These symptoms may represent a serious problem that is an emergency. Do not wait to see if the symptoms will go away. Get medical help right away. Call your local emergency services (911 in the [place] Do not drive yourself to the hospital. ?Summary ?Orthostatic hypotension is a sudden drop in blood pressure. ?It can cause light-headedness, sweating, rapid heartbeat, blurred vision, and fainting. ?Orthostatic hypotension can be diagnosed by having your blood pressure taken while lying down, sitting, and then standing. ?Treatment may involve changing your diet, wearing compression stockings, sitting up  slowly, adjusting your medicines, or correcting the underlying reason for the orthostatic hypotension. ?Get help right away if you have chest pain, a fast or irregular heartbeat, or symptoms of a stroke. ?This information is not intended to replace advice given to you by your health care provider. Make sure you discuss any questions you have with your health care provider. ?Document Revised: 12/25/2020 Document Reviewed: 12/25/2020 ?Elsevier Patient  Education ? 2022 [place]. ? ?  ?

## 2022-01-03 ENCOUNTER — Other Ambulatory Visit: Payer: Self-pay | Admitting: Physician Assistant

## 2022-01-04 ENCOUNTER — Other Ambulatory Visit: Payer: Self-pay | Admitting: *Deleted

## 2022-01-04 MED ORDER — MAGNESIUM OXIDE 400 MG PO TABS: 400.0000 mg | ORAL_TABLET | Freq: Every day | ORAL | 11 refills | Status: AC

## 2022-01-08 DIAGNOSIS — R000 Tachycardia, unspecified: Secondary | ICD-10-CM | POA: Diagnosis not present

## 2022-01-08 DIAGNOSIS — J0100 Acute maxillary sinusitis, unspecified: Secondary | ICD-10-CM | POA: Diagnosis not present

## 2022-01-13 ENCOUNTER — Encounter: Payer: Self-pay | Admitting: Physician Assistant

## 2022-01-14 NOTE — Progress Notes (Addendum)
Office Visit    Patient Name: [name] Date of Encounter: 01/15/2022  Primary Care Provider:  Leighton Ruff, MD (Inactive) Primary Cardiologist:  Jenkins Rouge, MD Primary Electrophysiologist: None Chief Complaint    [name] is a 66 y.o. female presents today for 2-week follow-up of B/P  Patient Profile: Severe aortic stenosis s/p TAVR 10/2019 HTN Renal cancer s/p resection 2007 Mild dilation of ascending aorta Mild carotid artery disease Occult stroke  Pulmonary nodules GERD Inappropriate Sinus Tach 2021  Prior CV Studies: 9/19 Echo-EF 60-65%, moderate concentric hypertrophy grade 2 DD moderate to severe aortic stenosis 10/19 Lexiscan-normal Myoview study with no evidence of ischemia 07/2019 echo-moderate aortic regurg, EF 60-65%, dilated LA, normal RA, aortic mean gradient 77.0 mmHg 6/22 Coronary CT-pre-TAVR  incidental findings of multiple small pulmonary nodules in the lungs 10/20 R/LHC-pre-TAVR 1/21 carotid duplex-pre-TAVR with antegrade flow and 1-39% bilateral stenosis 10/08/21 echo-TAVR mean gradient 19 mmHg, EF 70-75%, grade 1 DD  History of Present Illness    [name] is a 66 y.o. female with the above problem list with prior TAVR. She was started back on low-dose beta-blocker 10/2021 due to sinus tach in 120s.  She was seen in the ED on 3/2 with complaint of lightheadedness, nausea and vomiting.  Her blood pressure was 173/101.  Full work-up was completed with negative troponins, normal CT.  However, brain MRI showed possible subacute versus chronic white matter lacunar infarct. EDP thought this was chronic and she was dc'd home. On 3/10 at follow-up appointment blood pressures were improved with ranges of 120s to 130s over 70s to 80s.  She was encouraged to increase hydration and abstaining from salt.  She was also encouraged to check her blood pressures in the mornings prior to medication and to keep a log.  TSH and mag were ordered with  normal TSH and magnesium slightly abnormal at 1.4, prompting repletion.  Since last being seen in our clinic 2 weeks ago she reports that her blood pressure is a little better but still elevated.  Her heart rate today is 88-96 and this is consistent with numbers at home. She currently denies chest pain, palpitations, dyspnea, PND, orthopnea, nausea, vomiting, dizziness, syncope, edema, weight gain, or early satiety.  She does endorse facial flushing in the morning.  She currently denies any stroke symptoms and states that she was unaware of the significance of the finding. Today on examination she reports significant ankle edema over the last several days with underlying significant varicose veins.  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  Weight is up 3lb from prior (stated weight of 165 in ER was not measured weight - baseline around 178). She does admit to some dietary discretions with salt and is currently having home renovation that is limiting her ability to cook home meals.  She has been limited exercising due to her recent renovations and also the swelling that has occurred.   Past Medical History    Past Medical History:  Diagnosis Date   Diverticulitis, colon    GERD (gastroesophageal reflux disease)    Hypertension    Mild dilation of ascending aorta (HCC)    Pulmonary nodules    seen on pre TAVR CT, needs follow up    Renal cancer [facility]) 2007   Surgically removed from right kidney   S/P TAVR (transcatheter aortic valve replacement)    s/p TAVR w/ a Edwards Sapien 3 Ultra THV via  the TF approach on 11/13/19 with Dr. Burt Knack and Cyndia Bent.    Severe aortic stenosis    Stroke (cerebrum) ([place])    a. MRI of brain 12/2021 RI showed punctate subacute versus chronic white matter lacunar infarct in the posterior left corona radiata/centrum semiovale.   Past Surgical History:  Procedure Laterality Date   APPENDECTOMY     COLON SURGERY     HERNIA REPAIR     OVARIAN CYST  SURGERY     RIGHT/LEFT HEART CATH AND CORONARY ANGIOGRAPHY N/A 08/22/2019   Procedure: RIGHT/LEFT HEART CATH AND CORONARY ANGIOGRAPHY;  Surgeon: Belva Crome, MD;  Location: [place] [facility];  Service: Cardiovascular;  Laterality: N/A;   TEE WITHOUT CARDIOVERSION N/A 11/13/2019   Procedure: TRANSESOPHAGEAL ECHOCARDIOGRAM (TEE);  Surgeon: Sherren Mocha, MD;  Location: [place] [facility];  Service: Open Heart Surgery;  Laterality: N/A;   TRANSCATHETER AORTIC VALVE REPLACEMENT, TRANSFEMORAL N/A 11/13/2019   Procedure: TRANSCATHETER AORTIC VALVE REPLACEMENT, TRANSFEMORAL;  Surgeon: Sherren Mocha, MD;  Location: [place] [facility];  Service: Open Heart Surgery;  Laterality: N/A;    Allergies  Allergies  Allergen Reactions   Diphenhydramine Hcl     loopy   Protonix [Pantoprazole] Nausea And Vomiting    Nausea, headache, heightened senses.    Ciprofloxacin Itching   Codeine Itching   Diphenhydramine Other (See Comments)   Morphine And Related Nausea Only    Anxiety and Nausea     Home Medications    Current Outpatient Medications  Medication Sig Dispense Refill   amLODipine (NORVASC) 10 MG tablet Take 1 tablet (10 mg total) by mouth daily. 90 tablet 3   amoxicillin-clavulanate (AUGMENTIN) 875-125 MG tablet Take 1 tablet by mouth 2 (two) times daily.     cetirizine (ZYRTEC) 10 MG tablet Take 10 mg by mouth daily.     fluticasone (FLONASE) 50 MCG/ACT nasal spray Place 1 spray into both nostrils as needed for allergies or rhinitis.     magnesium oxide (MAG-OX) 400 MG tablet Take 1 tablet (400 mg total) by mouth daily. 30 tablet 11   metoprolol succinate (TOPROL-XL) 50 MG 24 hr tablet Take 1 tablet (50 mg total) by mouth daily. Take with or immediately following a meal. 90 tablet 3   omeprazole (PRILOSEC) 40 MG capsule TAKE 1 CAPSULE BY MOUTH EVERY DAY 90 capsule 3   OVER THE COUNTER MEDICATION Take 1 capsule by mouth daily. Tru niagen (Patient not taking: Reported on 01/15/2022)      OVER THE COUNTER MEDICATION Take 1 tablet by mouth daily at 6 (six) AM. Trans pterostilbene (Patient not taking: Reported on 01/15/2022)     No current facility-administered medications for this visit.     Review of Systems  Please see the history of present illness.    (+) Lower extremity edema  All other systems reviewed and are otherwise negative except as noted above.  Physical Exam    Wt Readings from Last 3 Encounters:  01/15/22 181 lb (82.1 kg)  01/01/22 178 lb 6.4 oz (80.9 kg)  12/24/21 165 lb (74.8 kg) (stated weight)   VS: Vitals:   01/15/22 1427  BP: 130/82  Pulse: 96  ,Body mass index is 29.21 kg/m.  GEN: Well nourished, well developed, in no acute distress. Neck: Supple, no JVD, carotid bruits, or masses. Cardiac: S1,S2,RRR, no murmurs, rubs, or gallops. No clubbing, cyanosis. Bilateral lower extremity edema primarily focalized bilateral ankles with extensive varicose veins. No pitting edema or erythema in the calves.  Radials/PT 2+ and equal bilaterally.  Respiratory: Respirations regular and unlabored, clear to auscultation bilaterally. MS: no deformity or atrophy. Skin: warm and dry, no rash. Neuro:  Strength and sensation are intact. Psych: Normal affect.  EKG/LABS/Other Studies Reviewed    ECG personally reviewed by me today -sinus rhythm with rate of 88- no acute changes.  Lab Results  Component Value Date   ALT 17 11/09/2019   AST 21 11/09/2019   ALKPHOS 69 11/09/2019   BILITOT 0.6 11/09/2019   No results found for: CHOL, HDL, LDLCALC, LDLDIRECT, TRIG, CHOLHDL  Lab Results  Component Value Date   HGBA1C 5.5 11/09/2019    Assessment & Plan    1.  Hypertension: -Blood pressure today is 130/82 she has tolerated the Norvasc 10 mg and Toprol 50 mg without any further complaints of dizziness or nausea.  She has endorsed indiscretions with sodium recently.  We discussed her sodium options to decrease her hypertension.  2.  Sinus  tachycardia: -Heart rate today is 88-96 with no complaints of palpitations since last appointment. She will continue Toprol XL 50 mg daily and we will continue to report any tachycardic symptoms. -Recent labs as reviewed above -If flushing continues, consider referral to endocrine to evaluate additional causes (I.e. carcinoid) - patient continues to follow with PCP at this time  3.  Lacunar stroke: -No focal stroke symptoms, unclear relationship to recent symptoms - Referral to neurology to neurology. -Carotid duplexes to rule out ischemic cause of old infarct. -2D echo to exclude any structural change - 30-day event monitor to be placed to evaluate for possible arrhythmia or atrial fibrillation. - start ASA '81mg'$  daily - Encouraged continue f/u with primary care  4. Lower extremity edema -Visually suggestive of venous insufficiency (? Amlodipine + hot weather today + excess sodium intake recently) -Check BNP, d-dimer with labs today - feel less likely DVT but will order, no symptoms of PE at this time -Based on labs, consider short course of Lasix  5.  Pulmonary nodules: -Incidental finding on TAVR CT, pulmonologist is Dr. Lamonte Sakai and recommended next CT scan to evaluate in 2022  6.  Aortic stenosis: -s/p TAVR in 1/19 with surveillance echo completed on 12/22 with mean aortic gradient of 19 mmHg -repeating echo as above given recent stroke on imaging - will remind of need for SBE prophylaxis when calling with lab results, not discussed acutely in clinic  7. Hypomagnesemia -Repeat Mg today     Disposition: Follow-up with Jenkins Rouge, MD or APP in 8 Weeks after completion of monitor     Medication Adjustments/Labs and Tests Ordered: Current medicines are reviewed at length with the patient today.  Concerns regarding medicines are outlined above.   Signed, Mable Fill, Marissa Nestle, NP 01/15/2022, 3:08 PM [place]

## 2022-01-15 ENCOUNTER — Other Ambulatory Visit: Payer: Self-pay

## 2022-01-15 ENCOUNTER — Ambulatory Visit: Payer: Medicare Other | Admitting: Nurse Practitioner

## 2022-01-15 ENCOUNTER — Encounter: Payer: Self-pay | Admitting: Physician Assistant

## 2022-01-15 VITALS — BP 130/82 | HR 96 | Ht 66.0 in | Wt 181.0 lb

## 2022-01-15 DIAGNOSIS — I6381 Other cerebral infarction due to occlusion or stenosis of small artery: Secondary | ICD-10-CM | POA: Diagnosis not present

## 2022-01-15 DIAGNOSIS — I10 Essential (primary) hypertension: Secondary | ICD-10-CM

## 2022-01-15 DIAGNOSIS — R000 Tachycardia, unspecified: Secondary | ICD-10-CM | POA: Diagnosis not present

## 2022-01-15 DIAGNOSIS — R918 Other nonspecific abnormal finding of lung field: Secondary | ICD-10-CM | POA: Diagnosis not present

## 2022-01-15 DIAGNOSIS — I350 Nonrheumatic aortic (valve) stenosis: Secondary | ICD-10-CM | POA: Diagnosis not present

## 2022-01-15 DIAGNOSIS — R600 Localized edema: Secondary | ICD-10-CM

## 2022-01-15 DIAGNOSIS — I6930 Unspecified sequelae of cerebral infarction: Secondary | ICD-10-CM | POA: Diagnosis not present

## 2022-01-15 MED ORDER — ASPIRIN EC 81 MG PO TBEC: 81.0000 mg | DELAYED_RELEASE_TABLET | Freq: Every day | ORAL | 3 refills | Status: AC

## 2022-01-15 NOTE — Patient Instructions (Addendum)
Medication Instructions:  ?START Aspirin '81mg'$  Take 1 table once a day. This medication can be bought over the counter. ?*If you need a refill on your cardiac medications before your next appointment, please call your pharmacy* ? ? ?Lab Work: ?Your physician recommends that you return for lab work in: TODAY-MAG, BNP, D-DIMER ?If you have labs (blood work) drawn today and your tests are completely normal, you will receive your results only by: ?MyChart Message (if you have MyChart) OR ?A paper copy in the mail ?If you have any lab test that is abnormal or we need to change your treatment, we will call you to review the results. ? ? ?Testing/Procedures: ?Your physician has requested that you have an echocardiogram. Echocardiography is a painless test that uses sound waves to create images of your heart. It provides your doctor with information about the size and shape of your heart and how well your heart?s chambers and valves are working. This procedure takes approximately one hour. There are no restrictions for this procedure. ? ?Your physician has requested that you have a carotid duplex. This test is an ultrasound of the carotid arteries in your neck. It looks at blood flow through these arteries that supply the brain with blood. Allow one hour for this exam. There are no restrictions or special instructions. ?THIS TEST WILL BE COMPLETED AT [facility] [address] OFFICE ? ?Your physician has recommended that you wear an 30 DAY event monitor. Event monitors are medical devices that record the heart?s electrical activity. Doctors most often [place] these monitors to diagnose arrhythmias. Arrhythmias are problems with the speed or rhythm of the heartbeat. The monitor is a small, portable device. You can wear one while you do your normal daily activities. This is usually used to diagnose what is causing palpitations/syncope (passing out). ?WE DO NOT HAVE ANY MORE MONITORS IN THE OFFICE. ONCE MORE COME IN WE WILL MAIL  YOU ONE. THERE IS A 1800 NUMBER FOR YOU TO CALL WITH QUESTIONS. IF YOU CAN NOT GE THE MONITOR ON FOR SOME REASON PLEASE GIVE [place] A CALL AN WE WILL MAKE AN APPOINTMENT FOR YOU TO HAVE IT PUT ON.  ? ?Follow-Up: ?At [facility], you and your health needs are our priority.  As part of our continuing mission to provide you with exceptional heart care, we have created designated Provider Care Teams.  These Care Teams include your primary Cardiologist (physician) and Advanced Practice Providers (APPs -  Physician Assistants and Nurse Practitioners) who all work together to provide you with the care you need, when you need it. ? ?We recommend signing up for the patient portal called "MyChart".  Sign up information is provided on this After Visit Summary.  MyChart is used to connect with patients for Virtual Visits (Telemedicine).  Patients are able to view lab/test results, encounter notes, upcoming appointments, etc.  Non-urgent messages can be sent to your provider as well.   ?To learn more about what you can do with MyChart, go to [email or web address].   ? ?Your next appointment:   ?8 week(s) ? ?The format for your next appointment:   ?In Person ? ?Provider:   ?Melina Copa, PA or Ambrose Pancoast, NP ? ? ?Other Instructions ?You have been referred to neurology. ? THEY WILL BE IN TOUCH WITH YOU. THEIR PHONE NUMBER IS [phone]. ?

## 2022-01-16 LAB — D-DIMER, QUANTITATIVE: D-DIMER: 0.48 mg/L FEU (ref 0.00–0.49)

## 2022-01-16 LAB — MAGNESIUM: Magnesium: 1.6 mg/dL (ref 1.6–2.3)

## 2022-01-16 LAB — PRO B NATRIURETIC PEPTIDE: NT-Pro BNP: 102 pg/mL (ref 0–301)

## 2022-01-18 ENCOUNTER — Encounter: Payer: Self-pay | Admitting: Neurology

## 2022-01-18 ENCOUNTER — Telehealth: Payer: Self-pay

## 2022-01-18 ENCOUNTER — Other Ambulatory Visit: Payer: Self-pay | Admitting: *Deleted

## 2022-01-18 DIAGNOSIS — I350 Nonrheumatic aortic (valve) stenosis: Secondary | ICD-10-CM

## 2022-01-18 DIAGNOSIS — I10 Essential (primary) hypertension: Secondary | ICD-10-CM

## 2022-01-18 MED ORDER — FUROSEMIDE 20 MG PO TABS: 20.0000 mg | ORAL_TABLET | Freq: Every day | ORAL | 0 refills | Status: DC

## 2022-01-18 MED ORDER — POTASSIUM CHLORIDE CRYS ER 20 MEQ PO TBCR: 20.0000 meq | EXTENDED_RELEASE_TABLET | Freq: Every day | ORAL | 0 refills | Status: DC

## 2022-01-18 NOTE — Telephone Encounter (Signed)
The patient has been notified of the result and verbalized understanding.  All questions (if any) were answered. ?Antonieta Iba, RN 01/18/2022 9:31 AM  ? ?

## 2022-01-18 NOTE — Telephone Encounter (Signed)
-----   Message from Tempie Donning, NP sent at 01/18/2022  8:19 AM EDT ----- ?Correction, ?Please have Ms. Phineas Real increase her magnesium supplement to twice daily, we will also have her take Lasix 20 mg x 3 days and 20 mEq of potassium x3 days.  We will have BMET drawn in 1 week, and remind her to contact PCP in regard to stop and probably set due to magnesium.  Please let me know if you have any questions. ? ? ?Thanks, ? ?Ambrose Pancoast, NP ? ?

## 2022-01-18 NOTE — Telephone Encounter (Signed)
See phone encounter.

## 2022-01-19 DIAGNOSIS — R200 Anesthesia of skin: Secondary | ICD-10-CM | POA: Diagnosis not present

## 2022-01-26 ENCOUNTER — Other Ambulatory Visit: Payer: Medicare Other | Admitting: *Deleted

## 2022-01-26 DIAGNOSIS — I350 Nonrheumatic aortic (valve) stenosis: Secondary | ICD-10-CM | POA: Diagnosis not present

## 2022-01-26 DIAGNOSIS — I10 Essential (primary) hypertension: Secondary | ICD-10-CM

## 2022-01-26 LAB — BASIC METABOLIC PANEL
BUN/Creatinine Ratio: 20 (ref 12–28)
BUN: 12 mg/dL (ref 8–27)
CO2: 27 mmol/L (ref 20–29)
Calcium: 9.8 mg/dL (ref 8.7–10.3)
Chloride: 99 mmol/L (ref 96–106)
Creatinine, Ser: 0.61 mg/dL (ref 0.57–1.00)
Glucose: 112 mg/dL — ABNORMAL HIGH (ref 70–99)
Potassium: 4.3 mmol/L (ref 3.5–5.2)
Sodium: 138 mmol/L (ref 134–144)
eGFR: 98 mL/min/{1.73_m2} (ref >59)

## 2022-01-27 ENCOUNTER — Ambulatory Visit (HOSPITAL_COMMUNITY): Payer: Medicare Other | Attending: Cardiovascular Disease

## 2022-01-27 ENCOUNTER — Ambulatory Visit (INDEPENDENT_AMBULATORY_CARE_PROVIDER_SITE_OTHER): Payer: Medicare Other

## 2022-01-27 DIAGNOSIS — R000 Tachycardia, unspecified: Secondary | ICD-10-CM

## 2022-01-27 DIAGNOSIS — I350 Nonrheumatic aortic (valve) stenosis: Secondary | ICD-10-CM | POA: Diagnosis not present

## 2022-01-27 DIAGNOSIS — I4891 Unspecified atrial fibrillation: Secondary | ICD-10-CM

## 2022-01-27 DIAGNOSIS — I6381 Other cerebral infarction due to occlusion or stenosis of small artery: Secondary | ICD-10-CM | POA: Diagnosis not present

## 2022-01-27 DIAGNOSIS — I10 Essential (primary) hypertension: Secondary | ICD-10-CM

## 2022-01-27 DIAGNOSIS — R918 Other nonspecific abnormal finding of lung field: Secondary | ICD-10-CM

## 2022-01-27 LAB — ECHOCARDIOGRAM COMPLETE
AR max vel: 1.13 cm2
AV Area VTI: 1.23 cm2
AV Area mean vel: 1.2 cm2
AV Mean grad: 20 mmHg
AV Peak grad: 38.9 mmHg
Ao pk vel: 3.12 m/s
Area-P 1/2: 3.27 cm2
S' Lateral: 2.9 cm

## 2022-02-01 ENCOUNTER — Ambulatory Visit (HOSPITAL_COMMUNITY)
Admission: RE | Admit: 2022-02-01 | Discharge: 2022-02-01 | Disposition: A | Discharge status: Home / Self Care | Payer: Medicare Other | Source: Ambulatory Visit | Attending: Cardiology | Admitting: Cardiology

## 2022-02-01 ENCOUNTER — Other Ambulatory Visit: Payer: Self-pay | Admitting: Nurse Practitioner

## 2022-02-01 DIAGNOSIS — I6523 Occlusion and stenosis of bilateral carotid arteries: Secondary | ICD-10-CM | POA: Diagnosis not present

## 2022-02-18 DIAGNOSIS — W540XXA Bitten by dog, initial encounter: Secondary | ICD-10-CM | POA: Diagnosis not present

## 2022-02-18 DIAGNOSIS — S91352A Open bite, left foot, initial encounter: Secondary | ICD-10-CM | POA: Diagnosis not present

## 2022-02-22 ENCOUNTER — Other Ambulatory Visit: Payer: Self-pay | Admitting: Nurse Practitioner

## 2022-02-22 DIAGNOSIS — I4891 Unspecified atrial fibrillation: Secondary | ICD-10-CM

## 2022-02-22 DIAGNOSIS — W540XXD Bitten by dog, subsequent encounter: Secondary | ICD-10-CM | POA: Diagnosis not present

## 2022-02-22 DIAGNOSIS — R918 Other nonspecific abnormal finding of lung field: Secondary | ICD-10-CM

## 2022-02-22 DIAGNOSIS — I350 Nonrheumatic aortic (valve) stenosis: Secondary | ICD-10-CM

## 2022-02-22 DIAGNOSIS — I6381 Other cerebral infarction due to occlusion or stenosis of small artery: Secondary | ICD-10-CM

## 2022-02-22 DIAGNOSIS — R000 Tachycardia, unspecified: Secondary | ICD-10-CM

## 2022-02-22 DIAGNOSIS — S91302A Unspecified open wound, left foot, initial encounter: Secondary | ICD-10-CM | POA: Diagnosis not present

## 2022-02-22 DIAGNOSIS — I10 Essential (primary) hypertension: Secondary | ICD-10-CM

## 2022-02-26 ENCOUNTER — Telehealth: Payer: Self-pay | Admitting: Cardiovascular Disease

## 2022-02-26 NOTE — Telephone Encounter (Signed)
Good morning, ?  ?Please let Ms.Grism know that her event monitor revealed NSR, with no significant arrhythmias or AV block. Please let me know if ypu have any questions. ?  ?Thank you, ?  ?Ambrose Pancoast, NP ?

## 2022-02-26 NOTE — Telephone Encounter (Signed)
Left message for patient to call back  

## 2022-02-26 NOTE — Telephone Encounter (Signed)
Called patient with results. Patient verbalized understanding. 

## 2022-02-26 NOTE — Telephone Encounter (Signed)
Patient returned call for her monitor results.  

## 2022-03-16 NOTE — Progress Notes (Unsigned)
Office Visit    Patient Name: [name] Date of Encounter: 03/16/2022  Primary Care Provider:  Marda Stalker, PA-C Primary Cardiologist:  Jenkins Rouge, MD Primary Electrophysiologist: None  Chief Complaint    [name] is a 66 y.o. female with PMH of HTN, GERD, mild dilation of ascending aorta, severe aortic stenosis s/p TAVR 10/2019, renal cancer s/p resection 2007, mild carotid artery disease, occult stroke, pulmonary nodules, inappropriate sinus tach.  Past Medical History    Past Medical History:  Diagnosis Date   Diverticulitis, colon    GERD (gastroesophageal reflux disease)    Hypertension    Mild dilation of ascending aorta (HCC)    Pulmonary nodules    seen on pre TAVR CT, needs follow up    Renal cancer [facility]) 2007   Surgically removed from right kidney   S/P TAVR (transcatheter aortic valve replacement)    s/p TAVR w/ a Edwards Sapien 3 Ultra THV via the TF approach on 11/13/19 with Dr. Burt Knack and Cyndia Bent.    Severe aortic stenosis    Stroke (cerebrum) ([place])    a. MRI of brain 12/2021 [place] showed punctate subacute versus chronic white matter lacunar infarct in the posterior left corona radiata/centrum semiovale.   Past Surgical History:  Procedure Laterality Date   APPENDECTOMY     COLON SURGERY     HERNIA REPAIR     OVARIAN CYST SURGERY     RIGHT/LEFT HEART CATH AND CORONARY ANGIOGRAPHY N/A 08/22/2019   Procedure: RIGHT/LEFT HEART CATH AND CORONARY ANGIOGRAPHY;  Surgeon: Belva Crome, MD;  Location: [facility];  Service: Cardiovascular;  Laterality: N/A;   TEE WITHOUT CARDIOVERSION N/A 11/13/2019   Procedure: TRANSESOPHAGEAL ECHOCARDIOGRAM (TEE);  Surgeon: Sherren Mocha, MD;  Location: [facility];  Service: Open Heart Surgery;  Laterality: N/A;   TRANSCATHETER AORTIC VALVE REPLACEMENT, TRANSFEMORAL N/A 11/13/2019   Procedure: TRANSCATHETER AORTIC VALVE REPLACEMENT, TRANSFEMORAL;  Surgeon: Sherren Mocha, MD;  Location: [facility];  Service: Open Heart Surgery;  Laterality: N/A;    Allergies  Allergies  Allergen Reactions   Diphenhydramine Hcl     loopy   Protonix [Pantoprazole] Nausea And Vomiting    Nausea, headache, heightened senses.    Ciprofloxacin Itching   Codeine Itching   Diphenhydramine Other (See Comments)   Morphine And Related Nausea Only    Anxiety and Nausea     History of Present Illness    [name] has a PMH of HTN, GERD, mild dilation of ascending aorta, severe aortic stenosis s/p TAVR 10/2019, renal cancer s/p resection 2007, mild carotid artery disease, occult stroke, pulmonary nodules, inappropriate sinus tach.  She was seen in the ED on 3/2 with complaint of lightheadedness, nausea and vomiting.  Her blood pressure was 173/101.  Full work-up was completed with negative troponins, normal CT.  However, brain MRI showed possible subacute versus chronic white matter lacunar infarct. EDP thought this was chronic and she was dc'd home. On 3/10 at follow-up appointment blood pressures were improved with ranges of 120s to 130s over 70s to 80s.  She was encouraged to increase hydration and abstaining from salt.  She was also encouraged to check her blood pressures in the mornings prior to medication and to keep a log.  TSH and mag were ordered with normal TSH and magnesium slightly abnormal at 1.4, prompting repletion.  Patient was seen and office on 01/15/2022 and reported that her blood pressure was better but  still elevated with heart rate of 88-96.  She had no complaints of chest pain or shortness of breath.  She did have lower extremity edema and admitted to some dietary discretions with sodium.  2D echo was completed that revealed 65-70%, in RWMA, mildly dilated LA, trivial MV regurgitation with no mitral stenosis.  Carotid duplex were also completed that were unchanged from her previous study in 2021.  Patient wore 30-day event monitor that revealed no atrial arrhythmia with  sinus rhythm underlying rhythm.  Since last being seen in the office patient reports***.  Patient denies chest pain, palpitations, dyspnea, PND, orthopnea, nausea, vomiting, dizziness, syncope, edema, weight gain, or early satiety.     ***Notes:  Home Medications    Current Outpatient Medications  Medication Sig Dispense Refill   amLODipine (NORVASC) 10 MG tablet Take 1 tablet (10 mg total) by mouth daily. 90 tablet 3   amoxicillin-clavulanate (AUGMENTIN) 875-125 MG tablet Take 1 tablet by mouth 2 (two) times daily.     aspirin EC 81 MG tablet Take 1 tablet (81 mg total) by mouth daily. Swallow whole. 90 tablet 3   cetirizine (ZYRTEC) 10 MG tablet Take 10 mg by mouth daily.     fluticasone (FLONASE) 50 MCG/ACT nasal spray Place 1 spray into both nostrils as needed for allergies or rhinitis.     furosemide (LASIX) 20 MG tablet Take 1 tablet (20 mg total) by mouth daily for 3 days. Take 1 tablet daily for three days. 30 tablet 0   magnesium oxide (MAG-OX) 400 MG tablet Take 1 tablet (400 mg total) by mouth daily. 30 tablet 11   metoprolol succinate (TOPROL-XL) 50 MG 24 hr tablet Take 1 tablet (50 mg total) by mouth daily. Take with or immediately following a meal. 90 tablet 3   omeprazole (PRILOSEC) 40 MG capsule TAKE 1 CAPSULE BY MOUTH EVERY DAY 90 capsule 3   OVER THE COUNTER MEDICATION Take 1 capsule by mouth daily. Tru niagen (Patient not taking: Reported on 01/15/2022)     OVER THE COUNTER MEDICATION Take 1 tablet by mouth daily at 6 (six) AM. Trans pterostilbene (Patient not taking: Reported on 01/15/2022)     potassium chloride SA (KLOR-CON M20) 20 MEQ tablet Take 1 tablet (20 mEq total) by mouth daily for 3 days. Take 1 tablet daily for 3 days. 30 tablet 0   No current facility-administered medications for this visit.     Review of Systems  Please see the history of present illness.    (+)*** (+)***  All other systems reviewed and are otherwise negative except as noted  above.  Physical Exam    Wt Readings from Last 3 Encounters:  01/15/22 181 lb (82.1 kg)  01/01/22 178 lb 6.4 oz (80.9 kg)  12/24/21 165 lb (74.8 kg)   NO:MVEHM were no vitals filed for this visit.,There is no height or weight on file to calculate BMI.  Constitutional:      Appearance: Healthy appearance. Not in distress.  Neck:     Vascular: JVD normal.  Pulmonary:     Effort: Pulmonary effort is normal.     Breath sounds: No wheezing. No rales. Diminished in the bases Cardiovascular:     Normal rate. Regular rhythm. Normal S1. Normal S2.      Murmurs: There is no murmur.  Edema:    Peripheral edema absent.  Abdominal:     Palpations: Abdomen is soft non tender. There is no hepatomegaly.  Skin:    General: Skin  is warm and dry.  Neurological:     General: No focal deficit present.     Mental Status: Alert and oriented to person, place and time.     Cranial Nerves: Cranial nerves are intact.  EKG/LABS/Other Studies Reviewed    ECG personally reviewed by me today - ***  Risk Assessment/Calculations:   {Does this patient have ATRIAL FIBRILLATION?:[phone]}        Lab Results  Component Value Date   WBC 8.3 12/24/2021   HGB 13.3 12/24/2021   HCT 41.0 12/24/2021   MCV 89.7 12/24/2021   PLT 275 12/24/2021   Lab Results  Component Value Date   CREATININE 0.61 01/26/2022   BUN 12 01/26/2022   NA 138 01/26/2022   K 4.3 01/26/2022   CL 99 01/26/2022   CO2 27 01/26/2022   Lab Results  Component Value Date   ALT 17 11/09/2019   AST 21 11/09/2019   ALKPHOS 69 11/09/2019   BILITOT 0.6 11/09/2019   No results found for: CHOL, HDL, LDLCALC, LDLDIRECT, TRIG, CHOLHDL  Lab Results  Component Value Date   HGBA1C 5.5 11/09/2019    Assessment & Plan    1.  Hypertension  2.  Sinus tachycardia  3.  Lacunar stroke  4.  Pulmonary nodules  5.  Lower extremity edema  6.  Aortic stenosis      Disposition: Follow-up with Jenkins Rouge, MD or APP in ***  months {Are you ordering a CV Procedure (e.g. stress test, cath, DCCV, TEE, etc)?   Press F2        :[id number]}   Medication Adjustments/Labs and Tests Ordered: Current medicines are reviewed at length with the patient today.  Concerns regarding medicines are outlined above.   Signed, Mable Fill, Marissa Nestle, NP 03/16/2022, 2:40 PM [place]

## 2022-03-17 ENCOUNTER — Encounter: Payer: Self-pay | Admitting: Nurse Practitioner

## 2022-03-17 ENCOUNTER — Ambulatory Visit: Payer: Medicare Other | Admitting: Nurse Practitioner

## 2022-03-17 VITALS — BP 120/70 | HR 88 | Ht 66.0 in | Wt 175.0 lb

## 2022-03-17 DIAGNOSIS — R000 Tachycardia, unspecified: Secondary | ICD-10-CM | POA: Diagnosis not present

## 2022-03-17 DIAGNOSIS — R918 Other nonspecific abnormal finding of lung field: Secondary | ICD-10-CM

## 2022-03-17 DIAGNOSIS — I6381 Other cerebral infarction due to occlusion or stenosis of small artery: Secondary | ICD-10-CM | POA: Diagnosis not present

## 2022-03-17 DIAGNOSIS — R600 Localized edema: Secondary | ICD-10-CM

## 2022-03-17 DIAGNOSIS — I10 Essential (primary) hypertension: Secondary | ICD-10-CM | POA: Diagnosis not present

## 2022-03-17 DIAGNOSIS — I350 Nonrheumatic aortic (valve) stenosis: Secondary | ICD-10-CM

## 2022-03-17 NOTE — Patient Instructions (Signed)
Medication Instructions:   Your physician recommends that you continue on your current medications as directed. Please refer to the Current Medication list given to you today.  *If you need a refill on your cardiac medications before your next appointment, please call your pharmacy*  Lab Work:  TODAY!!!!!  MAG  If you have labs (blood work) drawn today and your tests are completely normal, you will receive your results only by: [place] (if you have MyChart) OR A paper copy in the mail If you have any lab test that is abnormal or we need to change your treatment, we will call you to review the results.   Testing/Procedures:  None ordered.  Follow-Up: At [facility], you and your health needs are our priority.  As part of our continuing mission to provide you with exceptional heart care, we have created designated Provider Care Teams.  These Care Teams include your primary Cardiologist (physician) and Advanced Practice Providers (APPs -  Physician Assistants and Nurse Practitioners) who all work together to provide you with the care you need, when you need it.  We recommend signing up for the patient portal called "MyChart".  Sign up information is provided on this After Visit Summary.  MyChart is used to connect with patients for Virtual Visits (Telemedicine).  Patients are able to view lab/test results, encounter notes, upcoming appointments, etc.  Non-urgent messages can be sent to your provider as well.   To learn more about what you can do with MyChart, go to [email or web address].    Your next appointment:   3 month(s)  The format for your next appointment:   In Person  Provider:   Jenkins Rouge, MD    Important Information About Sugar

## 2022-03-18 ENCOUNTER — Telehealth: Payer: Self-pay | Admitting: *Deleted

## 2022-03-18 DIAGNOSIS — I6523 Occlusion and stenosis of bilateral carotid arteries: Secondary | ICD-10-CM

## 2022-03-18 LAB — MAGNESIUM: Magnesium: 1.9 mg/dL (ref 1.6–2.3)

## 2022-03-18 MED ORDER — ATORVASTATIN CALCIUM 20 MG PO TABS: 20.0000 mg | ORAL_TABLET | Freq: Every day | ORAL | 3 refills | Status: DC

## 2022-03-18 NOTE — Telephone Encounter (Signed)
-----   Message from Marylu Lund., NP sent at 03/18/2022  2:22 PM EDT ----- Please let Ms.Citron know that her magnesium level is within normal limits.  She can reduce her magnesium oxalate to 400 mg daily.  Please ask Ms.Kuehl if she would like for [place] to follow her lipids or PCP. Due to her recent carotid studies indicating  mild disease we recommend that she start a statin. If she is okay with [place] following we can start her on atorvastatin 20 mg daily.  We can have her return for LFTs and lipids in 6 to 8 weeks.  Please let me know if you have any questions.  Thank you,  Ambrose Pancoast, NP

## 2022-03-18 NOTE — Telephone Encounter (Signed)
Pt voices understanding of lab result and will decrease dose back to 400 mg daily.  She is in agreement to start atorvastatin 20 mg daily and will come to our office for fasting labs in 8 weeks.  Order placed.  Appointment made.

## 2022-04-21 ENCOUNTER — Encounter (HOSPITAL_BASED_OUTPATIENT_CLINIC_OR_DEPARTMENT_OTHER): Payer: Self-pay | Admitting: Emergency Medicine

## 2022-04-21 ENCOUNTER — Emergency Department (HOSPITAL_BASED_OUTPATIENT_CLINIC_OR_DEPARTMENT_OTHER)
Admission: EM | Admit: 2022-04-21 | Discharge: 2022-04-21 | Disposition: A | Discharge status: Home / Self Care | Payer: Medicare Other | Attending: Emergency Medicine | Admitting: Emergency Medicine

## 2022-04-21 ENCOUNTER — Other Ambulatory Visit: Payer: Self-pay

## 2022-04-21 ENCOUNTER — Emergency Department (HOSPITAL_BASED_OUTPATIENT_CLINIC_OR_DEPARTMENT_OTHER): Payer: Medicare Other

## 2022-04-21 DIAGNOSIS — I10 Essential (primary) hypertension: Secondary | ICD-10-CM | POA: Insufficient documentation

## 2022-04-21 DIAGNOSIS — Z79899 Other long term (current) drug therapy: Secondary | ICD-10-CM | POA: Insufficient documentation

## 2022-04-21 DIAGNOSIS — K760 Fatty (change of) liver, not elsewhere classified: Secondary | ICD-10-CM | POA: Diagnosis not present

## 2022-04-21 DIAGNOSIS — Z7982 Long term (current) use of aspirin: Secondary | ICD-10-CM | POA: Insufficient documentation

## 2022-04-21 DIAGNOSIS — R110 Nausea: Secondary | ICD-10-CM | POA: Diagnosis not present

## 2022-04-21 DIAGNOSIS — R1013 Epigastric pain: Secondary | ICD-10-CM | POA: Diagnosis not present

## 2022-04-21 DIAGNOSIS — R112 Nausea with vomiting, unspecified: Secondary | ICD-10-CM

## 2022-04-21 DIAGNOSIS — I700 Atherosclerosis of aorta: Secondary | ICD-10-CM | POA: Diagnosis not present

## 2022-04-21 DIAGNOSIS — R109 Unspecified abdominal pain: Secondary | ICD-10-CM

## 2022-04-21 DIAGNOSIS — R1110 Vomiting, unspecified: Secondary | ICD-10-CM | POA: Diagnosis not present

## 2022-04-21 LAB — CBC WITH DIFFERENTIAL/PLATELET
Abs Immature Granulocytes: 0.03 10*3/uL (ref 0.00–0.07)
Basophils Absolute: 0.1 10*3/uL (ref 0.0–0.1)
Basophils Relative: 1 %
Eosinophils Absolute: 0.1 10*3/uL (ref 0.0–0.5)
Eosinophils Relative: 1 %
HCT: 38.3 % (ref 36.0–46.0)
Hemoglobin: 12.8 g/dL (ref 12.0–15.0)
Immature Granulocytes: 0 %
Lymphocytes Relative: 24 %
Lymphs Abs: 1.9 10*3/uL (ref 0.7–4.0)
MCH: 29.4 pg (ref 26.0–34.0)
MCHC: 33.4 g/dL (ref 30.0–36.0)
MCV: 87.8 fL (ref 80.0–100.0)
Monocytes Absolute: 0.7 10*3/uL (ref 0.1–1.0)
Monocytes Relative: 8 %
Neutro Abs: 5.4 10*3/uL (ref 1.7–7.7)
Neutrophils Relative %: 66 %
Platelets: 239 10*3/uL (ref 150–400)
RBC: 4.36 MIL/uL (ref 3.87–5.11)
RDW: 13.2 % (ref 11.5–15.5)
WBC: 8.2 10*3/uL (ref 4.0–10.5)
nRBC: 0 % (ref 0.0–0.2)

## 2022-04-21 LAB — COMPREHENSIVE METABOLIC PANEL
ALT: 23 U/L (ref 0–44)
AST: 24 U/L (ref 15–41)
Albumin: 4.4 g/dL (ref 3.5–5.0)
Alkaline Phosphatase: 86 U/L (ref 38–126)
Anion gap: 9 (ref 5–15)
BUN: 11 mg/dL (ref 8–23)
CO2: 27 mmol/L (ref 22–32)
Calcium: 9.1 mg/dL (ref 8.9–10.3)
Chloride: 102 mmol/L (ref 98–111)
Creatinine, Ser: 0.61 mg/dL (ref 0.44–1.00)
GFR, Estimated: >60 mL/min (ref >60)
Glucose, Bld: 101 mg/dL — ABNORMAL HIGH (ref 70–99)
Potassium: 3.6 mmol/L (ref 3.5–5.1)
Sodium: 138 mmol/L (ref 135–145)
Total Bilirubin: 0.9 mg/dL (ref 0.3–1.2)
Total Protein: 7.8 g/dL (ref 6.5–8.1)

## 2022-04-21 LAB — URINALYSIS, ROUTINE W REFLEX MICROSCOPIC
Bilirubin Urine: NEGATIVE
Glucose, UA: NEGATIVE mg/dL
Hgb urine dipstick: NEGATIVE
Ketones, ur: NEGATIVE mg/dL
Leukocytes,Ua: NEGATIVE
Nitrite: NEGATIVE
Protein, ur: NEGATIVE mg/dL
Specific Gravity, Urine: 1.015 (ref 1.005–1.030)
pH: 6 (ref 5.0–8.0)

## 2022-04-21 LAB — LIPASE, BLOOD: Lipase: 29 U/L (ref 11–51)

## 2022-04-21 MED ORDER — ONDANSETRON 4 MG PO TBDP: ORAL_TABLET | ORAL | 0 refills | Status: DC

## 2022-04-21 MED ORDER — TRAMADOL HCL 50 MG PO TABS: 50.0000 mg | ORAL_TABLET | Freq: Four times a day (QID) | ORAL | 0 refills | Status: DC | PRN

## 2022-04-21 MED ORDER — IOHEXOL 300 MG/ML  SOLN
100.0000 mL | Freq: Once | INTRAMUSCULAR | Status: AC | PRN
  Administered 2022-04-21: 100 mL via INTRAVENOUS

## 2022-04-21 MED ORDER — SODIUM CHLORIDE 0.9 % IV BOLUS
1000.0000 mL | Freq: Once | INTRAVENOUS | Status: AC
  Administered 2022-04-21: 1000 mL via INTRAVENOUS

## 2022-04-21 MED ORDER — ONDANSETRON HCL 4 MG/2ML IJ SOLN
4.0000 mg | Freq: Once | INTRAMUSCULAR | Status: AC
  Administered 2022-04-21: 4 mg via INTRAVENOUS
  Filled 2022-04-21: qty 2

## 2022-04-21 MED ORDER — TRAMADOL HCL 50 MG PO TABS
50.0000 mg | ORAL_TABLET | Freq: Once | ORAL | Status: AC
  Administered 2022-04-21: 50 mg via ORAL
  Filled 2022-04-21: qty 1

## 2022-04-21 NOTE — ED Triage Notes (Signed)
Pt reports nausea and left sided abdominal pressure/pain since yesterday. Denies vomiting. Normal BM this morning.

## 2022-04-21 NOTE — ED Notes (Signed)
Patient to CT.

## 2022-04-21 NOTE — ED Provider Notes (Signed)
[facility] Provider Note   CSN: [id number] Arrival date & time: 04/21/22  1800     History  Chief Complaint  Patient presents with   Nausea   Abdominal Pain    [name] is a 66 y.o. female history of hypertension, here presenting with abdominal pain and nausea.  Patient states that she has multiple abdominal surgeries previously.  She had umbilical hernia repair and then had adhesions and required partial colectomy.  Patient states that she has been having nausea and epigastric pain since this morning.  She states that she is passing gas and has normal bowel movement today.  No history of SBO's.  She also has aortic stenosis and had aortic valve replacement.  The history is provided by the patient.       Home Medications Prior to Admission medications   Medication Sig Start Date End Date Taking? Authorizing Provider  amLODipine (NORVASC) 10 MG tablet Take 1 tablet (10 mg total) by mouth daily. 12/24/21   Josue Hector, MD  amoxicillin-clavulanate (AUGMENTIN) 875-125 MG tablet Take 1 tablet by mouth 2 (two) times daily. 01/08/22   [provider]  aspirin EC 81 MG tablet Take 1 tablet (81 mg total) by mouth daily. Swallow whole. 01/15/22   Marylu Lund., NP  atorvastatin (LIPITOR) 20 MG tablet Take 1 tablet (20 mg total) by mouth daily. 03/18/22   Marylu Lund., NP  cetirizine (ZYRTEC) 10 MG tablet Take 10 mg by mouth daily.    [provider]  fluticasone (FLONASE) 50 MCG/ACT nasal spray Place 1 spray into both nostrils as needed for allergies or rhinitis.    [provider]  furosemide (LASIX) 20 MG tablet Take 1 tablet (20 mg total) by mouth daily for 3 days. Take 1 tablet daily for three days. 01/18/22 03/17/22  Marylu Lund., NP  magnesium oxide (MAG-OX) 400 MG tablet Take 1 tablet (400 mg total) by mouth daily. 01/04/22   Elgie Collard, PA-C  metoprolol succinate (TOPROL-XL) 50 MG 24 hr tablet Take 1  tablet (50 mg total) by mouth daily. Take with or immediately following a meal. 12/24/21   Josue Hector, MD  omeprazole (PRILOSEC) 40 MG capsule TAKE 1 CAPSULE BY MOUTH EVERY DAY 07/06/21   Josue Hector, MD  OVER THE COUNTER MEDICATION Take 1 capsule by mouth daily. Tru niagen    [provider]  OVER THE COUNTER MEDICATION Take 1 tablet by mouth daily at 6 (six) AM. Trans pterostilbene    [provider]  potassium chloride SA (KLOR-CON M20) 20 MEQ tablet Take 1 tablet (20 mEq total) by mouth daily for 3 days. Take 1 tablet daily for 3 days. 01/18/22 03/17/22  Marylu Lund., NP      Allergies    Diphenhydramine hcl, Protonix [pantoprazole], Ciprofloxacin, Codeine, Diphenhydramine, and Morphine and related    Review of Systems   Review of Systems  Gastrointestinal:  Positive for abdominal pain and nausea.  All other systems reviewed and are negative.   Physical Exam Updated Vital Signs BP 126/88   Pulse 90   Temp 99 F (37.2 C) (Oral)   Resp 16   SpO2 96%  Physical Exam Vitals and nursing note reviewed.  Constitutional:      Comments: Slightly uncomfortable and dehydrated  HENT:     Head: Normocephalic.     Mouth/Throat:     Mouth: Mucous membranes are moist.  Eyes:  Extraocular Movements: Extraocular movements intact.     Pupils: Pupils are equal, round, and reactive to light.  Cardiovascular:     Rate and Rhythm: Normal rate and regular rhythm.     Heart sounds: Normal heart sounds.  Pulmonary:     Effort: Pulmonary effort is normal.     Breath sounds: Normal breath sounds.  Abdominal:     Comments: Complicated midline abdominal surgery scars that are healing well.  Mild left lower quadrant tenderness and mild diffuse distention   Neurological:     General: No focal deficit present.     Mental Status: She is oriented to person, place, and time.  Psychiatric:        Mood and Affect: Mood normal.        Behavior: Behavior normal.     ED  Results / Procedures / Treatments   Labs (all labs ordered are listed, but only abnormal results are displayed) Labs Reviewed  COMPREHENSIVE METABOLIC PANEL - Abnormal; Notable for the following components:      Result Value   Glucose, Bld 101 (*)    All other components within normal limits  URINALYSIS, ROUTINE W REFLEX MICROSCOPIC  CBC WITH DIFFERENTIAL/PLATELET  LIPASE, BLOOD  CBC WITH DIFFERENTIAL/PLATELET    EKG None  Radiology CT ABDOMEN PELVIS W CONTRAST  Result Date: 04/21/2022 CLINICAL DATA:  Nausea, vomiting, abdominal pain, acute, nonlocalized. Left abdominal pain. EXAM: CT ABDOMEN AND PELVIS WITH CONTRAST TECHNIQUE: Multidetector CT imaging of the abdomen and pelvis was performed using the standard protocol following bolus administration of intravenous contrast. RADIATION DOSE REDUCTION: This exam was performed according to the departmental dose-optimization program which includes automated exposure control, adjustment of the mA and/or kV according to patient size and/or use of iterative reconstruction technique. CONTRAST:  142m OMNIPAQUE IOHEXOL 300 MG/ML  SOLN COMPARISON:  None Available. FINDINGS: Lower chest: The visualized lung bases are clear. Transcatheter aortic valve replacement has been performed. Cardiac size is within normal limits. Hepatobiliary: Moderate hepatic steatosis. No enhancing intrahepatic mass. There is stable extrahepatic biliary ductal dilation with the expected dial duct measuring up to 10 mm distally. No intrahepatic biliary ductal dilation. Gallbladder unremarkable. Pancreas: The main pancreatic duct is dilated within the pancreatic head near the ampulla measuring up to 6 mm in diameter. Distally, the duct is decompressed. There is preservation of the pancreatic parenchyma no peripancreatic inflammatory changes are identified. Spleen: Unremarkable Adrenals/Urinary Tract: Adrenal glands are unremarkable. Kidneys are normal, without renal calculi, focal  lesion, or hydronephrosis. Surgical clips are again seen anterior to the upper pole of the left kidney. Bladder is unremarkable. Stomach/Bowel: Surgical changes of sigmoid colectomy are identified. Mild diverticulosis of the distal transverse, descending, and residual sigmoid colon without superimposed acute inflammatory change. The stomach, small bowel, and large bowel are otherwise unremarkable. The appendix is not clearly identified and may be absent. No free intraperitoneal gas or fluid. Vascular/Lymphatic: Aortic atherosclerosis. No enlarged abdominal or pelvic lymph nodes. Reproductive: Uterus and bilateral adnexa are unremarkable. Other: Infraumbilical ventral hernia repair with mesh has been performed. Tiny bilateral fat containing inguinal hernias are present. There is diastasis of the rectus abdominus musculature with protrusion of the mid transverse colon within the periumbilical region, however, no frank hernia identified. Musculoskeletal: No acute bone abnormality. Degenerative changes are seen within the lumbar spine. IMPRESSION: 1. No acute intra-abdominal pathology identified. No definite radiographic explanation for the patient's reported symptoms. 2. Moderate hepatic steatosis. 3. Stable extrahepatic biliary ductal dilation and dilation of the main  pancreatic duct within the pancreatic head near the ampulla. Correlation with liver enzymes would be helpful to exclude an obstructive process at the ampulla. If indicated, this could be further assessed with ERCP or MRCP examination. 4. Surgical changes of sigmoid colectomy. Mild distal colonic diverticulosis without superimposed acute inflammatory change. 5. Diastasis of the rectus abdominus musculature with protrusion of the mid transverse colon within the periumbilical region, however, no frank recurrent hernia identified. Aortic Atherosclerosis (ICD10-I70.0).  Motor vehicular Electronically Signed   By: Fidela Salisbury M.D.   On: 04/21/2022 22:44     Procedures Procedures    Medications Ordered in ED Medications  sodium chloride 0.9 % bolus 1,000 mL (0 mLs Intravenous Stopped 04/21/22 2216)  ondansetron (ZOFRAN) injection 4 mg (4 mg Intravenous Given 04/21/22 2052)  iohexol (OMNIPAQUE) 300 MG/ML solution 100 mL (100 mLs Intravenous Contrast Given 04/21/22 2217)  traMADol (ULTRAM) tablet 50 mg (50 mg Oral Given 04/21/22 2323)    ED Course/ Medical Decision Making/ A&P                           Medical Decision Making [name] is a 66 y.o. female here presenting with abdominal pain and distention.  She had previous abdominal surgeries.  Concern for possible bowel obstruction versus ileus.  Plan to get CBC and CMP and CT abdomen pelvis.  We will hydrate and reassess   11:29 PM I reviewed patient's labs and independently interpreted his CT scan.  Patient's labs and urinalysis were unremarkable.  CT showed some biliary ductal dilatation but no acute findings.  Patient has some postsurgical changes but no obvious obstruction.  Patient has follow-up with Dr. Ninfa Linden from surgery.  Stable for discharge  Amount and/or Complexity of Data Reviewed Labs: ordered. Decision-making details documented in ED Course. Radiology: ordered and independent interpretation performed. Decision-making details documented in ED Course.  Risk Prescription drug management.    Final Clinical Impression(s) / ED Diagnoses Final diagnoses:  None    Rx / DC Orders ED Discharge Orders     None         Drenda Freeze, MD 04/21/22 [phone]

## 2022-04-21 NOTE — Discharge Instructions (Signed)
You have postsurgical changes on your CT scan.  Please take Zofran for nausea and tramadol for pain  See your surgeon for follow-up  Return to ER if you have worse abdominal pain, vomiting, fever

## 2022-04-26 ENCOUNTER — Ambulatory Visit
Admission: EM | Admit: 2022-04-26 | Discharge: 2022-04-26 | Disposition: A | Discharge status: Home / Self Care | Payer: Medicare Other | Attending: Urgent Care | Admitting: Urgent Care

## 2022-04-26 ENCOUNTER — Encounter: Payer: Self-pay | Admitting: Emergency Medicine

## 2022-04-26 DIAGNOSIS — A084 Viral intestinal infection, unspecified: Secondary | ICD-10-CM | POA: Diagnosis not present

## 2022-04-26 DIAGNOSIS — Z8719 Personal history of other diseases of the digestive system: Secondary | ICD-10-CM

## 2022-04-26 DIAGNOSIS — K5730 Diverticulosis of large intestine without perforation or abscess without bleeding: Secondary | ICD-10-CM | POA: Diagnosis not present

## 2022-04-26 DIAGNOSIS — R112 Nausea with vomiting, unspecified: Secondary | ICD-10-CM

## 2022-04-26 MED ORDER — LOPERAMIDE HCL 2 MG PO CAPS: 2.0000 mg | ORAL_CAPSULE | Freq: Two times a day (BID) | ORAL | 0 refills | Status: DC | PRN

## 2022-04-26 MED ORDER — ONDANSETRON 8 MG PO TBDP: 8.0000 mg | ORAL_TABLET | Freq: Three times a day (TID) | ORAL | 0 refills | Status: DC | PRN

## 2022-04-26 NOTE — ED Triage Notes (Addendum)
Pt here with recurrent nausea x [redacted] week along with loss of appetite. Pt has some meds from a visit to the ED for the same thing 5 days ago. Pt also c/o of LUQ pain.

## 2022-04-26 NOTE — ED Provider Notes (Signed)
[place]   MRN: [id number] DOB: 1956-01-20  Subjective:   [name] is a 66 y.o. female presenting for 1 week history of persistent nausea.  Has also had 5-day history of diarrhea, vomiting and is now having intermittent moderate left-sided tenderness.  She was seen through the emergency room 5 days ago and had negative work-up.  Has a history of diverticulitis that led to partial colectomy.  Denies any bloody stools, fevers.  No urinary symptoms, hematuria.  No flank tenderness. No cough, chest pain, shob.   No current facility-administered medications for this encounter.  Current Outpatient Medications:    amLODipine (NORVASC) 10 MG tablet, Take 1 tablet (10 mg total) by mouth daily., Disp: 90 tablet, Rfl: 3   amoxicillin-clavulanate (AUGMENTIN) 875-125 MG tablet, Take 1 tablet by mouth 2 (two) times daily., Disp: , Rfl:    aspirin EC 81 MG tablet, Take 1 tablet (81 mg total) by mouth daily. Swallow whole., Disp: 90 tablet, Rfl: 3   atorvastatin (LIPITOR) 20 MG tablet, Take 1 tablet (20 mg total) by mouth daily., Disp: 90 tablet, Rfl: 3   cetirizine (ZYRTEC) 10 MG tablet, Take 10 mg by mouth daily., Disp: , Rfl:    fluticasone (FLONASE) 50 MCG/ACT nasal spray, Place 1 spray into both nostrils as needed for allergies or rhinitis., Disp: , Rfl:    furosemide (LASIX) 20 MG tablet, Take 1 tablet (20 mg total) by mouth daily for 3 days. Take 1 tablet daily for three days., Disp: 30 tablet, Rfl: 0   magnesium oxide (MAG-OX) 400 MG tablet, Take 1 tablet (400 mg total) by mouth daily., Disp: 30 tablet, Rfl: 11   metoprolol succinate (TOPROL-XL) 50 MG 24 hr tablet, Take 1 tablet (50 mg total) by mouth daily. Take with or immediately following a meal., Disp: 90 tablet, Rfl: 3   omeprazole (PRILOSEC) 40 MG capsule, TAKE 1 CAPSULE BY MOUTH EVERY DAY, Disp: 90 capsule, Rfl: 3   ondansetron (ZOFRAN-ODT) 4 MG disintegrating tablet, '4mg'$  ODT q4 hours prn nausea/vomit, Disp:  10 tablet, Rfl: 0   OVER THE COUNTER MEDICATION, Take 1 capsule by mouth daily. Tru niagen, Disp: , Rfl:    OVER THE COUNTER MEDICATION, Take 1 tablet by mouth daily at 6 (six) AM. Trans pterostilbene, Disp: , Rfl:    potassium chloride SA (KLOR-CON M20) 20 MEQ tablet, Take 1 tablet (20 mEq total) by mouth daily for 3 days. Take 1 tablet daily for 3 days., Disp: 30 tablet, Rfl: 0   traMADol (ULTRAM) 50 MG tablet, Take 1 tablet (50 mg total) by mouth every 6 (six) hours as needed., Disp: 10 tablet, Rfl: 0   Allergies  Allergen Reactions   Diphenhydramine Hcl     loopy   Protonix [Pantoprazole] Nausea And Vomiting    Nausea, headache, heightened senses.    Ciprofloxacin Itching   Codeine Itching   Diphenhydramine Other (See Comments)   Morphine And Related Nausea Only    Anxiety and Nausea     Past Medical History:  Diagnosis Date   Diverticulitis, colon    GERD (gastroesophageal reflux disease)    Hypertension    Mild dilation of ascending aorta (HCC)    Pulmonary nodules    seen on pre TAVR CT, needs follow up    Renal cancer [facility]) 2007   Surgically removed from right kidney   S/P TAVR (transcatheter aortic valve replacement)    s/p TAVR w/ a Edwards Sapien 3 Ultra THV via  the TF approach on 11/13/19 with Dr. Burt Knack and Cyndia Bent.    Severe aortic stenosis    Stroke (cerebrum) ([place])    a. MRI of brain 12/2021 [facility] showed punctate subacute versus chronic white matter lacunar infarct in the posterior left corona radiata/centrum semiovale.     Past Surgical History:  Procedure Laterality Date   APPENDECTOMY     COLON SURGERY     HERNIA REPAIR     OVARIAN CYST SURGERY     RIGHT/LEFT HEART CATH AND CORONARY ANGIOGRAPHY N/A 08/22/2019   Procedure: RIGHT/LEFT HEART CATH AND CORONARY ANGIOGRAPHY;  Surgeon: Belva Crome, MD;  Location: [place] [facility];  Service: Cardiovascular;  Laterality: N/A;   TEE WITHOUT CARDIOVERSION N/A 11/13/2019   Procedure: TRANSESOPHAGEAL ECHOCARDIOGRAM  (TEE);  Surgeon: Sherren Mocha, MD;  Location: [place] [facility];  Service: Open Heart Surgery;  Laterality: N/A;   TRANSCATHETER AORTIC VALVE REPLACEMENT, TRANSFEMORAL N/A 11/13/2019   Procedure: TRANSCATHETER AORTIC VALVE REPLACEMENT, TRANSFEMORAL;  Surgeon: Sherren Mocha, MD;  Location: [place] [facility];  Service: Open Heart Surgery;  Laterality: N/A;    Family History  Problem Relation Age of Onset   Cancer Mother        Lung   Cancer Father        Lung   Hypertension Father    Cancer Sister        Lung   Heart attack Paternal Grandfather    Heart attack Paternal Aunt     Social History   Tobacco Use   Smoking status: Former    Packs/day: 1.00    Years: 10.00    Total pack years: 10.00    Types: Cigarettes    Quit date: 10/25/2005    Years since quitting: 16.5   Smokeless tobacco: Never   Tobacco comments:    quit around 2007   Vaping Use   Vaping Use: Never used  Substance Use Topics   Alcohol use: Yes    Comment: occ   Drug use: No    ROS   Objective:   Vitals: BP 134/83   Pulse 94   Temp 98.9 F (37.2 C)   Resp 20   SpO2 96%   Physical Exam Constitutional:      General: She is not in acute distress.    Appearance: Normal appearance. She is well-developed. She is not ill-appearing, toxic-appearing or diaphoretic.  HENT:     Head: Normocephalic and atraumatic.     Nose: Nose normal.     Mouth/Throat:     Mouth: Mucous membranes are moist.     Pharynx: Oropharynx is clear.  Eyes:     General: No scleral icterus.       Right eye: No discharge.        Left eye: No discharge.     Extraocular Movements: Extraocular movements intact.     Conjunctiva/sclera: Conjunctivae normal.  Cardiovascular:     Rate and Rhythm: Normal rate and regular rhythm.     Heart sounds: Normal heart sounds. No murmur heard.    No friction rub. No gallop.  Pulmonary:     Effort: Pulmonary effort is normal. No respiratory distress.     Breath sounds: No stridor. No  wheezing, rhonchi or rales.  Chest:     Chest wall: No tenderness.  Abdominal:     General: Bowel sounds are increased. There is no distension.     Palpations: Abdomen is soft. There is no mass.  Tenderness: There is generalized abdominal tenderness (left sided) and tenderness in the left upper quadrant and left lower quadrant. There is no right CVA tenderness, left CVA tenderness, guarding or rebound.  Skin:    General: Skin is warm and dry.  Neurological:     General: No focal deficit present.     Mental Status: She is alert and oriented to person, place, and time.  Psychiatric:        Mood and Affect: Mood normal.        Behavior: Behavior normal.        Thought Content: Thought content normal.        Judgment: Judgment normal.     Recent Results (from the past 2160 hour(s))  ECHOCARDIOGRAM COMPLETE     Status: None   Collection Time: 01/27/22  3:27 PM  Result Value Ref Range   Area-P 1/2 3.27 cm2   S' Lateral 2.90 cm   AV Area mean vel 1.20 cm2   AR max vel 1.13 cm2   AV Area VTI 1.23 cm2   Ao pk vel 3.12 m/s   AV Mean grad 20.0 mmHg   AV Peak grad 38.9 mmHg  Magnesium     Status: None   Collection Time: 03/17/22  2:13 PM  Result Value Ref Range   Magnesium 1.9 1.6 - 2.3 mg/dL  Urinalysis, Routine w reflex microscopic Urine, Clean Catch     Status: None   Collection Time: 04/21/22  7:38 PM  Result Value Ref Range   Color, Urine YELLOW YELLOW   APPearance CLEAR CLEAR   Specific Gravity, Urine 1.015 1.005 - 1.030   pH 6.0 5.0 - 8.0   Glucose, UA NEGATIVE NEGATIVE mg/dL   Hgb urine dipstick NEGATIVE NEGATIVE   Bilirubin Urine NEGATIVE NEGATIVE   Ketones, ur NEGATIVE NEGATIVE mg/dL   Protein, ur NEGATIVE NEGATIVE mg/dL   Nitrite NEGATIVE NEGATIVE   Leukocytes,Ua NEGATIVE NEGATIVE    Comment: Microscopic not done on urines with negative protein, blood, leukocytes, nitrite, or glucose < 500 mg/dL. Performed at [facility], [address] [place], [place] [address]   CBC with Differential/Platelet     Status: None   Collection Time: 04/21/22  8:49 PM  Result Value Ref Range   WBC 8.2 4.0 - 10.5 K/uL   RBC 4.36 3.87 - 5.11 MIL/uL   Hemoglobin 12.8 12.0 - 15.0 g/dL   HCT 38.3 36.0 - 46.0 %   MCV 87.8 80.0 - 100.0 fL   MCH 29.4 26.0 - 34.0 pg   MCHC 33.4 30.0 - 36.0 g/dL   RDW 13.2 11.5 - 15.5 %   Platelets 239 150 - 400 K/uL   nRBC 0.0 0.0 - 0.2 %   Neutrophils Relative % 66 %   Neutro Abs 5.4 1.7 - 7.7 K/uL   Lymphocytes Relative 24 %   Lymphs Abs 1.9 0.7 - 4.0 K/uL   Monocytes Relative 8 %   Monocytes Absolute 0.7 0.1 - 1.0 K/uL   Eosinophils Relative 1 %   Eosinophils Absolute 0.1 0.0 - 0.5 K/uL   Basophils Relative 1 %   Basophils Absolute 0.1 0.0 - 0.1 K/uL   Immature Granulocytes 0 %   Abs Immature Granulocytes 0.03 0.00 - 0.07 K/uL    Comment: Performed at [facility], [address] [place], [place] [address]  Comprehensive metabolic panel     Status: Abnormal   Collection Time: 04/21/22  8:49 PM  Result  Value Ref Range   Sodium 138 135 - 145 mmol/L   Potassium 3.6 3.5 - 5.1 mmol/L   Chloride 102 98 - 111 mmol/L   CO2 27 22 - 32 mmol/L   Glucose, Bld 101 (H) 70 - 99 mg/dL    Comment: Glucose reference range applies only to samples taken after fasting for at least 8 hours.   BUN 11 8 - 23 mg/dL   Creatinine, Ser 0.61 0.44 - 1.00 mg/dL   Calcium 9.1 8.9 - 10.3 mg/dL   Total Protein 7.8 6.5 - 8.1 g/dL   Albumin 4.4 3.5 - 5.0 g/dL   AST 24 15 - 41 U/L   ALT 23 0 - 44 U/L   Alkaline Phosphatase 86 38 - 126 U/L   Total Bilirubin 0.9 0.3 - 1.2 mg/dL   GFR, Estimated >60 >60 mL/min    Comment: (NOTE) Calculated using the CKD-EPI Creatinine Equation (2021)    Anion gap 9 5 - 15    Comment: Performed at [facility], [address] [place], [place] [address]  Lipase, blood     Status: None   Collection Time: 04/21/22  8:49 PM  Result Value Ref Range   Lipase 29 11 - 51 U/L    Comment:  Performed at [facility], [address] [place], [place] [address]    CT ABDOMEN PELVIS W CONTRAST  Result Date: 04/21/2022 CLINICAL DATA:  Nausea, vomiting, abdominal pain, acute, nonlocalized. Left abdominal pain. EXAM: CT ABDOMEN AND PELVIS WITH CONTRAST TECHNIQUE: Multidetector CT imaging of the abdomen and pelvis was performed using the standard protocol following bolus administration of intravenous contrast. RADIATION DOSE REDUCTION: This exam was performed according to the departmental dose-optimization program which includes automated exposure control, adjustment of the mA and/or kV according to patient size and/or use of iterative reconstruction technique. CONTRAST:  13m OMNIPAQUE IOHEXOL 300 MG/ML  SOLN COMPARISON:  None Available. FINDINGS: Lower chest: The visualized lung bases are clear. Transcatheter aortic valve replacement has been performed. Cardiac size is within normal limits. Hepatobiliary: Moderate hepatic steatosis. No enhancing intrahepatic mass. There is stable extrahepatic biliary ductal dilation with the expected dial duct measuring up to 10 mm distally. No intrahepatic biliary ductal dilation. Gallbladder unremarkable. Pancreas: The main pancreatic duct is dilated within the pancreatic head near the ampulla measuring up to 6 mm in diameter. Distally, the duct is decompressed. There is preservation of the pancreatic parenchyma no peripancreatic inflammatory changes are identified. Spleen: Unremarkable Adrenals/Urinary Tract: Adrenal glands are unremarkable. Kidneys are normal, without renal calculi, focal lesion, or hydronephrosis. Surgical clips are again seen anterior to the upper pole of the left kidney. Bladder is unremarkable. Stomach/Bowel: Surgical changes of sigmoid colectomy are identified. Mild diverticulosis of the distal transverse, descending, and residual sigmoid colon without superimposed acute inflammatory change. The stomach, small bowel, and large  bowel are otherwise unremarkable. The appendix is not clearly identified and may be absent. No free intraperitoneal gas or fluid. Vascular/Lymphatic: Aortic atherosclerosis. No enlarged abdominal or pelvic lymph nodes. Reproductive: Uterus and bilateral adnexa are unremarkable. Other: Infraumbilical ventral hernia repair with mesh has been performed. Tiny bilateral fat containing inguinal hernias are present. There is diastasis of the rectus abdominus musculature with protrusion of the mid transverse colon within the periumbilical region, however, no frank hernia identified. Musculoskeletal: No acute bone abnormality. Degenerative changes are seen within the lumbar spine. IMPRESSION: 1. No acute intra-abdominal pathology identified. No definite radiographic explanation for the patient's  reported symptoms. 2. Moderate hepatic steatosis. 3. Stable extrahepatic biliary ductal dilation and dilation of the main pancreatic duct within the pancreatic head near the ampulla. Correlation with liver enzymes would be helpful to exclude an obstructive process at the ampulla. If indicated, this could be further assessed with ERCP or MRCP examination. 4. Surgical changes of sigmoid colectomy. Mild distal colonic diverticulosis without superimposed acute inflammatory change. 5. Diastasis of the rectus abdominus musculature with protrusion of the mid transverse colon within the periumbilical region, however, no frank recurrent hernia identified. Aortic Atherosclerosis (ICD10-I70.0).  Motor vehicular Electronically Signed   By: Fidela Salisbury M.D.   On: 04/21/2022 22:44   CARDIAC EVENT MONITOR  Result Date: 02/22/2022 NSR No significant arrhythmia No significant AV block Jenkins Rouge MD [facility]  VAS US CAROTID  Result Date: 02/02/2022 Carotid Arterial Duplex Study Patient Name:  [name]  Date of Exam:   02/01/2022 Medical Rec #: [id number]         Accession #:    [id number] Date of Birth: 1956/06/17         Patient Gender:  F Patient Age:   50 years Exam Location:  [facility] Procedure:      VAS US CAROTID Referring Phys: Jaquelyn Bitter DICK --------------------------------------------------------------------------------  Indications:       Carotid artery disease and patient denies any cerebrovascular                    symptoms. Risk Factors:      Hypertension, past history of smoking. Comparison Study:  In 10/2019, a carotid duplex showed velocities of 55/18 cm/s                    in the RICA and 50/27 cm/s in the LICA. Performing Technologist: Sharlett Iles RVT  Examination Guidelines: A complete evaluation includes B-mode imaging, spectral Doppler, color Doppler, and power Doppler as needed of all accessible portions of each vessel. Bilateral testing is considered an integral part of a complete examination. Limited examinations for reoccurring indications may be performed as noted.  Right Carotid Findings: +----------+--------+--------+--------+------------------+--------+           PSV cm/sEDV cm/sStenosisPlaque DescriptionComments +----------+--------+--------+--------+------------------+--------+ CCA Prox  46      14                                         +----------+--------+--------+--------+------------------+--------+ CCA Distal63      17                                         +----------+--------+--------+--------+------------------+--------+ ICA Prox  47      15      1-39%   heterogenous               +----------+--------+--------+--------+------------------+--------+ ICA Mid   63      20                                         +----------+--------+--------+--------+------------------+--------+ ICA Distal70      23  tortuous +----------+--------+--------+--------+------------------+--------+ ECA       119     17              heterogenous               +----------+--------+--------+--------+------------------+--------+  +----------+--------+-------+----------------+-------------------+           PSV cm/sEDV cmsDescribe        Arm Pressure (mmHG) +----------+--------+-------+----------------+-------------------+ Subclavian120            Multiphasic, [id number]                 +----------+--------+-------+----------------+-------------------+ +---------+--------+--+--------+--+---------+ VertebralPSV cm/s43EDV cm/s14Antegrade +---------+--------+--+--------+--+---------+  Left Carotid Findings: +----------+--------+--------+--------+------------------+--------+           PSV cm/sEDV cm/sStenosisPlaque DescriptionComments +----------+--------+--------+--------+------------------+--------+ CCA Prox  46      8                                 tortuous +----------+--------+--------+--------+------------------+--------+ CCA Distal71      22                                         +----------+--------+--------+--------+------------------+--------+ ICA Prox  63      20      1-39%   heterogenous               +----------+--------+--------+--------+------------------+--------+ ICA Mid   53      19                                         +----------+--------+--------+--------+------------------+--------+ ICA Distal68      23                                tortuous +----------+--------+--------+--------+------------------+--------+ ECA       66      11                                         +----------+--------+--------+--------+------------------+--------+ +----------+--------+--------+---------+-------------------+           PSV cm/sEDV cm/sDescribe Arm Pressure (mmHG) +----------+--------+--------+---------+-------------------+ Leotis Shames             [id number]                 +----------+--------+--------+---------+-------------------+ +---------+--------+--+--------+-+---------+ VertebralPSV cm/s35EDV cm/s7Antegrade  +---------+--------+--+--------+-+---------+   Summary: Right Carotid: Velocities in the right ICA are consistent with a 1-39% stenosis. Left Carotid: Velocities in the left ICA are consistent with a 1-39% stenosis. Vertebrals:  Bilateral vertebral arteries demonstrate antegrade flow. Subclavians: Left subclavian artery flow was disturbed. Normal flow hemodynamics              were seen in the right subclavian artery. *See table(s) above for measurements and observations.  Electronically signed by Carlyle Dolly MD on 02/02/2022 at 2:25:49 PM.    Final    ECHOCARDIOGRAM COMPLETE  Result Date: 01/27/2022    ECHOCARDIOGRAM REPORT   Patient Name:   DEEPA BARTHEL Date of Exam: 01/27/2022 Medical Rec #:  [id number]        Height:       66.0 in Accession #:    [id number]  Weight:       181.0 lb Date of Birth:  07-31-1956        BSA:          1.917 m Patient Age:    89 years         BP:           130/82 mmHg Patient Gender: F                HR:           87 bpm. Exam Location:  [facility] Procedure: 2D Echo, Cardiac Doppler and Color Doppler Indications:    I35.0 AS  History:        Patient has prior history of Echocardiogram examinations, most                 recent 10/08/2021. Stroke, AS s/p TAVR (98m Edwards Sapien 3                 Ultra THV); Risk Factors:Hypertension.  Sonographer:    WCoralyn HellingRDCS Referring Phys: 2[place] JR DICK IMPRESSIONS  1. Left ventricular ejection fraction, by estimation, is 65 to 70%. The left ventricle has normal function. The left ventricle has no regional wall motion abnormalities. There is mild left ventricular hypertrophy. Left ventricular diastolic parameters were normal.  2. Right ventricular systolic function is normal. The right ventricular size is normal.  3. Left atrial size was mildly dilated.  4. The mitral valve is normal in structure. Trivial mitral valve regurgitation. No evidence of mitral stenosis.  5. Post TAVR with 23 mm Sapien 3 gradients  similar to TTE done 10/08/21 No PVL. The aortic valve has been repaired/replaced. Aortic valve regurgitation is not visualized. No aortic stenosis is present.  6. The inferior vena cava is normal in size with greater than 50% respiratory variability, suggesting right atrial pressure of 3 mmHg. FINDINGS  Left Ventricle: Left ventricular ejection fraction, by estimation, is 65 to 70%. The left ventricle has normal function. The left ventricle has no regional wall motion abnormalities. The left ventricular internal cavity size was normal in size. There is  mild left ventricular hypertrophy. Left ventricular diastolic parameters were normal. Right Ventricle: The right ventricular size is normal. No increase in right ventricular wall thickness. Right ventricular systolic function is normal. Left Atrium: Left atrial size was mildly dilated. Right Atrium: Right atrial size was normal in size. Pericardium: There is no evidence of pericardial effusion. Mitral Valve: The mitral valve is normal in structure. Trivial mitral valve regurgitation. No evidence of mitral valve stenosis. Tricuspid Valve: The tricuspid valve is normal in structure. Tricuspid valve regurgitation is mild . No evidence of tricuspid stenosis. Aortic Valve: Post TAVR with 23 mm Sapien 3 gradients similar to TTE done 10/08/21 No PVL. The aortic valve has been repaired/replaced. Aortic valve regurgitation is not visualized. No aortic stenosis is present. Aortic valve mean gradient measures 20.0 mmHg. Aortic valve peak gradient measures 38.9 mmHg. Aortic valve area, by VTI measures 1.23 cm. Pulmonic Valve: The pulmonic valve was normal in structure. Pulmonic valve regurgitation is not visualized. No evidence of pulmonic stenosis. Aorta: The aortic root is normal in size and structure. Venous: The inferior vena cava is normal in size with greater than 50% respiratory variability, suggesting right atrial pressure of 3 mmHg. IAS/Shunts: No atrial level shunt  detected by color flow Doppler.  LEFT VENTRICLE PLAX 2D LVIDd:         4.50 cm  Diastology LVIDs:         2.90 cm   LV e' medial:    6.85 cm/s LV PW:         0.90 cm   LV E/e' medial:  13.5 LV IVS:        1.30 cm   LV e' lateral:   6.53 cm/s LVOT diam:     2.00 cm   LV E/e' lateral: 14.2 LV SV:         78 LV SV Index:   40 LVOT Area:     3.14 cm  RIGHT VENTRICLE             IVC RV S prime:     17.00 cm/s  IVC diam: 2.10 cm TAPSE (M-mode): 2.5 cm RVSP:           30.0 mmHg LEFT ATRIUM             Index        RIGHT ATRIUM           Index LA diam:        3.90 cm 2.03 cm/m   RA Pressure: 3.00 mmHg LA Vol (A2C):   54.8 ml 28.59 ml/m  RA Area:     12.50 cm LA Vol (A4C):   68.3 ml 35.63 ml/m  RA Volume:   29.20 ml  15.23 ml/m LA Biplane Vol: 63.2 ml 32.97 ml/m  AORTIC VALVE AV Area (Vmax):    1.13 cm AV Area (Vmean):   1.20 cm AV Area (VTI):     1.23 cm AV Vmax:           312.00 cm/s AV Vmean:          206.000 cm/s AV VTI:            0.633 m AV Peak Grad:      38.9 mmHg AV Mean Grad:      20.0 mmHg LVOT Vmax:         112.00 cm/s LVOT Vmean:        78.700 cm/s LVOT VTI:          0.247 m LVOT/AV VTI ratio: 0.39  AORTA Ao Root diam: 3.00 cm Ao Asc diam:  3.60 cm MITRAL VALVE                TRICUSPID VALVE MV Area (PHT): 3.27 cm     TR Peak grad:   27.0 mmHg MV Decel Time: 232 msec     TR Vmax:        260.00 cm/s MV E velocity: 92.80 cm/s   Estimated RAP:  3.00 mmHg MV A velocity: 116.00 cm/s  RVSP:           30.0 mmHg MV E/A ratio:  0.80                             SHUNTS                             Systemic VTI:  0.25 m                             Systemic Diam: 2.00 cm Jenkins Rouge MD Electronically signed by Jenkins Rouge MD Signature Date/Time: 01/27/2022/3:57:18 PM    Final      Assessment and Plan :  PDMP not reviewed this encounter.  1. Viral gastroenteritis   2. Nausea vomiting and diarrhea   3. History of diverticulitis   4. Diverticulosis of colon without hemorrhage    Discussed possibility of  a recurrent diverticulitis, acute abdomen.  However patient is not inclined to present to the emergency room again today.  I do think this is reasonable given her increased bowel sounds, diarrhea and nausea vomiting.  We agreed to manage this for a viral gastroenteritis with supportive care, increasing her use of Zofran to 8 mg and adding loperamide.  Hydrate very well, eat light meals including mostly soups.  Maintain strict ER precautions. Counseled patient on potential for adverse effects with medications prescribed today, patient verbalized understanding.    Jaynee Eagles, PA-C 04/26/22 1300

## 2022-04-26 NOTE — Discharge Instructions (Signed)

## 2022-04-27 NOTE — Progress Notes (Unsigned)
NEUROLOGY CONSULTATION NOTE  [name] MRN: [id number] DOB: 1956/09/21  Referring provider: Rebekah Chesterfield, NP Primary care provider: Marda Stalker, PA-C  Reason for consult:  stroke  Assessment/Plan:   Punctate infarct, likely secondary to small vessel disease.  Incidental finding.  - possibly related to elevated blood pressure Hypertension  1  Agree with ASA '81mg'$  daily 2  Normotensive blood pressure as managed by PCP and cardiology 3  Maintain cholesterol control as per PCP 4  Follow up as needed.   Subjective:  [name] is a 66 year old female with HTN and aortic stenosis s/p TAVR who presents for stroke.  CT and MRI of brain from 12/24/2021 personally reviewed.   She presented to the ED on 12/24/2021 for onset of lightheadedness, nausea and vomiting.  She has been experiencing elevated blood pressure for about 2 weeks.  Blood pressure was found to be 173/101.  CT head showed no acute abnormality but MRI of brain showed possible subacute versus chronic punctate lacunar infarct within the posterior left corona radiata/centrum semiovale  Following discharge, blood pressure improved.  Her cardiologist had her start ASA '81mg'$  daily.  2D echo on 01/27/2022 showed EF 65-70%.  Carotid doppler on 02/01/2022 showed 1-39% bilateral ICA stenosis.  30 day cardiac event monitoring in April showed no a fib or other significant arrhythmias or AV block.  She feels well now.    PAST MEDICAL HISTORY: Past Medical History:  Diagnosis Date   Diverticulitis, colon    GERD (gastroesophageal reflux disease)    Hypertension    Mild dilation of ascending aorta (HCC)    Pulmonary nodules    seen on pre TAVR CT, needs follow up    Renal cancer [facility]) 2007   Surgically removed from right kidney   S/P TAVR (transcatheter aortic valve replacement)    s/p TAVR w/ a Edwards Sapien 3 Ultra THV via the TF approach on 11/13/19 with Dr. Burt Knack and Cyndia Bent.    Severe aortic stenosis    Stroke  (cerebrum) ([place])    a. MRI of brain 12/2021 RI showed punctate subacute versus chronic white matter lacunar infarct in the posterior left corona radiata/centrum semiovale.    PAST SURGICAL HISTORY: Past Surgical History:  Procedure Laterality Date   APPENDECTOMY     COLON SURGERY     HERNIA REPAIR     OVARIAN CYST SURGERY     RIGHT/LEFT HEART CATH AND CORONARY ANGIOGRAPHY N/A 08/22/2019   Procedure: RIGHT/LEFT HEART CATH AND CORONARY ANGIOGRAPHY;  Surgeon: Belva Crome, MD;  Location: [place] [facility];  Service: Cardiovascular;  Laterality: N/A;   TEE WITHOUT CARDIOVERSION N/A 11/13/2019   Procedure: TRANSESOPHAGEAL ECHOCARDIOGRAM (TEE);  Surgeon: Sherren Mocha, MD;  Location: [place] [facility];  Service: Open Heart Surgery;  Laterality: N/A;   TRANSCATHETER AORTIC VALVE REPLACEMENT, TRANSFEMORAL N/A 11/13/2019   Procedure: TRANSCATHETER AORTIC VALVE REPLACEMENT, TRANSFEMORAL;  Surgeon: Sherren Mocha, MD;  Location: [place] [facility];  Service: Open Heart Surgery;  Laterality: N/A;    MEDICATIONS: Current Outpatient Medications on File Prior to Visit  Medication Sig Dispense Refill   amLODipine (NORVASC) 10 MG tablet Take 1 tablet (10 mg total) by mouth daily. 90 tablet 3   aspirin EC 81 MG tablet Take 1 tablet (81 mg total) by mouth daily. Swallow whole. 90 tablet 3   atorvastatin (LIPITOR) 20 MG tablet Take 1 tablet (20 mg total) by mouth daily. 90 tablet 3   cetirizine (ZYRTEC) 10 MG  tablet Take 10 mg by mouth daily.     fluticasone (FLONASE) 50 MCG/ACT nasal spray Place 1 spray into both nostrils as needed for allergies or rhinitis.     furosemide (LASIX) 20 MG tablet Take 1 tablet (20 mg total) by mouth daily for 3 days. Take 1 tablet daily for three days. 30 tablet 0   loperamide (IMODIUM) 2 MG capsule Take 1 capsule (2 mg total) by mouth 2 (two) times daily as needed for diarrhea or loose stools. 14 capsule 0   magnesium oxide (MAG-OX) 400 MG tablet Take 1 tablet (400 mg  total) by mouth daily. 30 tablet 11   metoprolol succinate (TOPROL-XL) 50 MG 24 hr tablet Take 1 tablet (50 mg total) by mouth daily. Take with or immediately following a meal. 90 tablet 3   omeprazole (PRILOSEC) 40 MG capsule TAKE 1 CAPSULE BY MOUTH EVERY DAY 90 capsule 3   ondansetron (ZOFRAN-ODT) 8 MG disintegrating tablet Take 1 tablet (8 mg total) by mouth every 8 (eight) hours as needed for nausea or vomiting. 20 tablet 0   OVER THE COUNTER MEDICATION Take 1 capsule by mouth daily. Tru niagen     OVER THE COUNTER MEDICATION Take 1 tablet by mouth daily at 6 (six) AM. Trans pterostilbene     potassium chloride SA (KLOR-CON M20) 20 MEQ tablet Take 1 tablet (20 mEq total) by mouth daily for 3 days. Take 1 tablet daily for 3 days. 30 tablet 0   traMADol (ULTRAM) 50 MG tablet Take 1 tablet (50 mg total) by mouth every 6 (six) hours as needed. 10 tablet 0   No current facility-administered medications on file prior to visit.    ALLERGIES: Allergies  Allergen Reactions   Diphenhydramine Hcl     loopy   Protonix [Pantoprazole] Nausea And Vomiting    Nausea, headache, heightened senses.    Ciprofloxacin Itching   Codeine Itching   Diphenhydramine Other (See Comments)   Morphine And Related Nausea Only    Anxiety and Nausea     FAMILY HISTORY: Family History  Problem Relation Age of Onset   Cancer Mother        Lung   Cancer Father        Lung   Hypertension Father    Cancer Sister        Lung   Heart attack Paternal Grandfather    Heart attack Paternal Aunt     Objective:  Blood pressure 135/83, pulse 93, height '5\' 6"'$  (1.676 m), weight 177 lb (80.3 kg), SpO2 95 %. General: No acute distress.  Patient appears well-groomed.   Head:  Normocephalic/atraumatic Eyes:  fundi examined but not visualized Neck: supple, no paraspinal tenderness, full range of motion Back: No paraspinal tenderness Heart: regular rate and rhythm, murmur appreciated Neurological Exam: Mental status:  alert and oriented to person, place, and time, speech fluent and not dysarthric, language intact. Cranial nerves: CN I: not tested CN II: pupils equal, round and reactive to light, visual fields intact CN III, IV, VI:  full range of motion, no nystagmus, no ptosis CN V: facial sensation intact. CN VII: upper and lower face symmetric CN VIII: hearing intact CN IX, X: gag intact, uvula midline CN XI: sternocleidomastoid and trapezius muscles intact CN XII: tongue midline Bulk & Tone: normal, no fasciculations. Motor:  muscle strength 5/5 throughout Sensation:  Temperature and vibratory sensation intact. Deep Tendon Reflexes:  2+ throughout,  toes downgoing.  Finger to nose testing:  Without dysmetria.  Heel to shin:  Without dysmetria.   Gait:  Normal station and stride.  Romberg negative.    Thank you for allowing me to take part in the care of this patient.  Metta Clines, DO  CC:  Marda Stalker, PA-C  Rebekah Chesterfield, NP

## 2022-04-28 ENCOUNTER — Ambulatory Visit: Payer: Medicare Other | Admitting: Neurology

## 2022-04-28 ENCOUNTER — Encounter: Payer: Self-pay | Admitting: Neurology

## 2022-04-28 VITALS — BP 135/83 | HR 93 | Ht 66.0 in | Wt 177.0 lb

## 2022-04-28 DIAGNOSIS — I10 Essential (primary) hypertension: Secondary | ICD-10-CM | POA: Diagnosis not present

## 2022-04-28 DIAGNOSIS — I639 Cerebral infarction, unspecified: Secondary | ICD-10-CM

## 2022-04-28 NOTE — Patient Instructions (Signed)
Continue aspirin 81 mg daily.   

## 2022-05-06 DIAGNOSIS — M79645 Pain in left finger(s): Secondary | ICD-10-CM | POA: Diagnosis not present

## 2022-05-06 DIAGNOSIS — M79642 Pain in left hand: Secondary | ICD-10-CM | POA: Diagnosis not present

## 2022-05-10 DIAGNOSIS — M79642 Pain in left hand: Secondary | ICD-10-CM | POA: Diagnosis not present

## 2022-05-13 ENCOUNTER — Other Ambulatory Visit: Payer: Medicare Other

## 2022-05-13 DIAGNOSIS — I6523 Occlusion and stenosis of bilateral carotid arteries: Secondary | ICD-10-CM | POA: Diagnosis not present

## 2022-05-13 LAB — HEPATIC FUNCTION PANEL
ALT: 22 IU/L (ref 0–32)
AST: 24 IU/L (ref 0–40)
Albumin: 4.8 g/dL (ref 3.9–4.9)
Alkaline Phosphatase: 103 IU/L (ref 44–121)
Bilirubin Total: 0.3 mg/dL (ref 0.0–1.2)
Bilirubin, Direct: 0.11 mg/dL (ref 0.00–0.40)
Total Protein: 7.6 g/dL (ref 6.0–8.5)

## 2022-05-13 LAB — LIPID PANEL
Chol/HDL Ratio: 3.2 ratio (ref 0.0–4.4)
Cholesterol, Total: 154 mg/dL (ref 100–199)
HDL: 48 mg/dL (ref >39)
LDL Chol Calc (NIH): 88 mg/dL (ref 0–99)
Triglycerides: 96 mg/dL (ref 0–149)
VLDL Cholesterol Cal: 18 mg/dL (ref 5–40)

## 2022-05-14 ENCOUNTER — Other Ambulatory Visit: Payer: Self-pay | Admitting: *Deleted

## 2022-05-14 DIAGNOSIS — I6523 Occlusion and stenosis of bilateral carotid arteries: Secondary | ICD-10-CM

## 2022-05-14 DIAGNOSIS — I2510 Atherosclerotic heart disease of native coronary artery without angina pectoris: Secondary | ICD-10-CM

## 2022-05-14 DIAGNOSIS — Z79899 Other long term (current) drug therapy: Secondary | ICD-10-CM

## 2022-05-20 DIAGNOSIS — M79642 Pain in left hand: Secondary | ICD-10-CM | POA: Diagnosis not present

## 2022-05-24 DIAGNOSIS — M79642 Pain in left hand: Secondary | ICD-10-CM | POA: Diagnosis not present

## 2022-05-27 DIAGNOSIS — J0100 Acute maxillary sinusitis, unspecified: Secondary | ICD-10-CM | POA: Diagnosis not present

## 2022-06-03 DIAGNOSIS — M79642 Pain in left hand: Secondary | ICD-10-CM | POA: Diagnosis not present

## 2022-06-14 NOTE — Progress Notes (Signed)
Cardiology Office Note    Date:  06/22/2022   ID:  Tiffany, [name] 1956/07/12, MRN [id number]  PCP:  Tiffany Stalker, PA-C  Cardiologist: Tiffany Rouge, MD / Dr. Burt [name] & Dr. Cyndia [name] (TAVR)  CC: Post TAVR   History of Present Illness:  [name] is a 66 y.o. female with a history of HTN, renal cell carcinoma s/p resection, and severe AS s/p TAVR (11/13/19) who presents to clinic for follow up.   TTE 07/2019  marked increase in mean gradient of 77 mmHg and peak gradient of 109 mmHg with a calculated aortic valve area of 0.76 cm. [facility] 08/22/19 showed normal cors and confirmed severe AS.   Underwent successful TAVR with a 23 mm Edwards Sapien 3 THV via the TF approach on 11/13/19. Post operative echo showed EF 65-70%, normally functioning TAVR with mean gradient of 19 mm hg and no PVL. She was discharged on aspirin and plavix.  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 Watched a nice YouTube tribute to her that was on [employer] news  Started back on beta blocker 11/16/21 as HR  elevated Monitor 01/27/22 no significant arrhythmias  Seen in ED 12/24/21 with naausea/vomiting and dizziness BP up MRI with punctate lacunar infarct carotids plaque no stenosis monitor no PAF    She feels fine now Ironically husband had a stroke as well involving vision in his left ey  More LE edema Has varicosities and has gained some weight Discussed stopping Norvasc to see if that helps as well Has PRN lasix    Past Medical History:  Diagnosis Date   Diverticulitis, colon    GERD (gastroesophageal reflux disease)    Hypertension     Mild dilation of ascending aorta (HCC)    Pulmonary nodules    seen on pre TAVR CT, needs follow up    Renal cancer ([place]) 2007   Surgically removed from right kidney   S/P TAVR (transcatheter aortic valve replacement)    s/p TAVR w/ a Edwards Sapien 3 Ultra THV via the TF approach on 11/13/19 with Dr. Burt [name] and [name].    Severe aortic stenosis    Stroke (cerebrum) ([place])    a. MRI of brain 12/2021 [place] showed punctate subacute versus chronic white matter lacunar infarct in the posterior left corona radiata/centrum semiovale.    Past Surgical History:  Procedure Laterality Date   APPENDECTOMY     COLON SURGERY     HERNIA REPAIR     OVARIAN CYST SURGERY     RIGHT/LEFT HEART CATH AND CORONARY ANGIOGRAPHY N/A 08/22/2019   Procedure: RIGHT/LEFT HEART CATH AND CORONARY ANGIOGRAPHY;  Surgeon: Belva Crome, MD;  Location: [place] [facility];  Service: Cardiovascular;  Laterality: N/A;   TEE WITHOUT CARDIOVERSION N/A 11/13/2019   Procedure: TRANSESOPHAGEAL ECHOCARDIOGRAM (TEE);  Surgeon: Sherren Mocha, MD;  Location: [place] [facility];  Service: Open Heart Surgery;  Laterality: N/A;   TRANSCATHETER AORTIC VALVE REPLACEMENT, TRANSFEMORAL N/A 11/13/2019   Procedure: TRANSCATHETER AORTIC VALVE REPLACEMENT, TRANSFEMORAL;  Surgeon: Sherren Mocha, MD;  Location: [place] [facility];  Service: Open Heart Surgery;  Laterality: N/A;    Current Medications: Outpatient Medications Prior to Visit  Medication Sig Dispense Refill   aspirin EC 81 MG tablet Take 1 tablet (81 mg total) by mouth daily. Swallow whole. 90 tablet 3   atorvastatin (LIPITOR) 20 MG tablet Take 1 tablet (20 mg total) by mouth daily. 90 tablet 3   cetirizine (ZYRTEC) 10 MG tablet Take 10 mg by mouth daily.     fluticasone (FLONASE) 50 MCG/ACT nasal spray Place 1 spray into both nostrils as needed for allergies or rhinitis.     magnesium oxide (MAG-OX) 400 MG tablet Take 1 tablet (400 mg total) by mouth daily. 30 tablet 11    metoprolol succinate (TOPROL-XL) 50 MG 24 hr tablet Take 1 tablet (50 mg total) by mouth daily. Take with or immediately following a meal. 90 tablet 3   omeprazole (PRILOSEC) 40 MG capsule TAKE 1 CAPSULE BY MOUTH EVERY DAY 90 capsule 3   OVER THE COUNTER MEDICATION Take 1 capsule by mouth daily. Tru niagen     OVER THE COUNTER MEDICATION Take 1 tablet by mouth daily at 6 (six) AM. Trans pterostilbene     potassium chloride SA (KLOR-CON M20) 20 MEQ tablet Take 1 tablet (20 mEq total) by mouth daily for 3 days. Take 1 tablet daily for 3 days. 30 tablet 0   amLODipine (NORVASC) 10 MG tablet Take 1 tablet (10 mg total) by mouth daily. 90 tablet 3   furosemide (LASIX) 20 MG tablet Take 1 tablet (20 mg total) by mouth daily for 3 days. Take 1 tablet daily for three days. 30 tablet 0   loperamide (IMODIUM) 2 MG capsule Take 1 capsule (2 mg total) by mouth 2 (two) times daily as needed for diarrhea or loose stools. 14 capsule 0   ondansetron (ZOFRAN-ODT) 8 MG disintegrating tablet Take 1 tablet (8 mg total) by mouth every 8 (eight) hours as needed for nausea or vomiting. 20 tablet 0   traMADol (ULTRAM) 50 MG tablet Take 1 tablet (50 mg total) by mouth every 6 (six) hours as needed. (Patient not taking: Reported on 04/28/2022) 10 tablet 0   No facility-administered medications prior to visit.     Allergies:   Diphenhydramine hcl, Protonix [pantoprazole], Ciprofloxacin, Codeine, Diphenhydramine, and Morphine and related   Social History   Socioeconomic History   Marital status: Married    Spouse name: Not on file   Number of children: Not on file   Years of education: Not on file   Highest education level: Not on file  Occupational History   Not on file  Tobacco Use   Smoking status: Former    Packs/day: 1.00    Years: 10.00    Total pack years: 10.00    Types: Cigarettes    Quit date: 10/25/2005    Years since quitting: 16.6   Smokeless tobacco: Never   Tobacco comments:    quit around 2007    Vaping Use   Vaping Use: Never used  Substance and Sexual Activity   Alcohol use: Yes    Comment: occ   Drug use: No   Sexual activity: Not on file  Other Topics Concern   Not on file  Social History Narrative   Right handed  Caffeine 1 1/2 cup daily   Live in one story with husband   Social Determinants of Health   Financial Resource Strain: Not on file  Food Insecurity: Not on file  Transportation Needs: Not on file  Physical Activity: Not on file  Stress: Not on file  Social Connections: Not on file     Family History:  The patient's family history includes Cancer in her father, mother, and sister; Heart attack in her paternal aunt and paternal grandfather; Hypertension in her father.     ROS:   Please see the history of present illness.    ROS All other systems reviewed and are negative.   PHYSICAL EXAM:   VS:  BP 124/72   Pulse 90   Ht '5\' 6"'$  (1.676 m)   Wt 174 lb (78.9 kg)   SpO2 96%   BMI 28.08 kg/m     Affect appropriate Healthy:  appears stated age 17: normal Neck supple with no adenopathy JVP normal no bruits no thyromegaly Lungs clear with no wheezing and good diaphragmatic motion Heart:  S1/S2 SEM through TAVR valve no AR  murmur, no rub, gallop or click PMI normal Abdomen: benighn, BS positve, no tenderness, no AAA no bruit.  No HSM or HJR Distal pulses intact with no bruits Plus one bilateral edema with varicosities  Neuro non-focal Skin warm and dry No muscular weakness    Wt Readings from Last 3 Encounters:  06/22/22 174 lb (78.9 kg)  04/28/22 177 lb (80.3 kg)  03/17/22 175 lb (79.4 kg)      Studies/Labs Reviewed:   EKG:  SR rate 84 bpm  LAD otherwise normal 10/30/20  Recent Labs: 01/01/2022: TSH 1.680 01/15/2022: NT-Pro BNP 102 03/17/2022: Magnesium 1.9 04/21/2022: BUN 11; Creatinine, Ser 0.61; Hemoglobin 12.8; Platelets 239; Potassium 3.6; Sodium 138 05/13/2022: ALT 22   Lipid Panel    Component Value Date/Time   CHOL 154  05/13/2022 0740   TRIG 96 05/13/2022 0740   HDL 48 05/13/2022 0740   CHOLHDL 3.2 05/13/2022 0740   LDLCALC 88 05/13/2022 0740    Additional studies/ records that were reviewed today include:  TAVR OPERATIVE NOTE     Date of Procedure:                11/13/2019   Preoperative Diagnosis:      Severe Aortic Stenosis    Postoperative Diagnosis:    Same    Procedure:        Transcatheter Aortic Valve Replacement - Percutaneous Right Transfemoral Approach             Edwards Sapien 3 Ultra THV (size 23 mm, model # 9750TFX, serial # [id number])              Co-Surgeons:                        [name], MD and Sherren Mocha, MD     Anesthesiologist:                  Suella Broad, MD   Echocardiographer:              Ena Dawley, MD   Pre-operative Echo Findings: Severe aortic stenosis Normal left ventricular systolic function   Post-operative Echo Findings: No paravalvular leak Normal left ventricular systolic function   _____________     Echo  . 01/27/22  IMPRESSIONS     1. Left ventricular ejection fraction, by estimation,  is 65 to 70%. The  left ventricle has normal function. The left ventricle has no regional  wall motion abnormalities. There is mild left ventricular hypertrophy.  Left ventricular diastolic parameters  were normal.   2. Right ventricular systolic function is normal. The right ventricular  size is normal.   3. Left atrial size was mildly dilated.   4. The mitral valve is normal in structure. Trivial mitral valve  regurgitation. No evidence of mitral stenosis.   5. Post TAVR with 23 mm Sapien 3 gradients similar to TTE done 10/08/21  No PVL. The aortic valve has been repaired/replaced. Aortic valve  regurgitation is not visualized. No aortic stenosis is present.   6. The inferior vena cava is normal in size with greater than 50%  respiratory variability, suggesting right atrial pressure of 3 mmHg.  ASSESSMENT & PLAN:   Severe AS s/p  TAVR: SBE, plavix DC  stable gradients no PVL on TTE 01/27/22    HTN: D/c Norvasc ? Contributing to LE edema Lasix PRN    GERD: back on prolosec discussed low carb diet and meal portions    Pulmonary nodules: these are followed closely by Dr. Brock Ra. He recommended another CT scan in 2 years.   CVA:  lacunar ? Related to small vessel dx continue ASA, statin and good BP control F/U Dr Tomi Likens neurology   Echo :  post TAVR 01/2023  Stop Norvasc  F/U in  6-8 weeks      Signed, Tiffany Rouge, MD  06/22/2022 10:33 AM    [facility] [address], [place], [place]  [address] Phone: [phone]; Fax: [phone]

## 2022-06-16 DIAGNOSIS — M79642 Pain in left hand: Secondary | ICD-10-CM | POA: Diagnosis not present

## 2022-06-17 DIAGNOSIS — M79642 Pain in left hand: Secondary | ICD-10-CM | POA: Diagnosis not present

## 2022-06-22 ENCOUNTER — Ambulatory Visit: Payer: Medicare Other | Attending: Cardiovascular Disease | Admitting: Cardiovascular Disease

## 2022-06-22 ENCOUNTER — Encounter: Payer: Self-pay | Admitting: Cardiovascular Disease

## 2022-06-22 VITALS — BP 124/72 | HR 90 | Ht 66.0 in | Wt 174.0 lb

## 2022-06-22 DIAGNOSIS — R609 Edema, unspecified: Secondary | ICD-10-CM | POA: Diagnosis not present

## 2022-06-22 DIAGNOSIS — Z952 Presence of prosthetic heart valve: Secondary | ICD-10-CM

## 2022-06-22 DIAGNOSIS — I10 Essential (primary) hypertension: Secondary | ICD-10-CM | POA: Diagnosis not present

## 2022-06-22 DIAGNOSIS — I350 Nonrheumatic aortic (valve) stenosis: Secondary | ICD-10-CM

## 2022-06-22 MED ORDER — FUROSEMIDE 20 MG PO TABS: 20.0000 mg | ORAL_TABLET | Freq: Every day | ORAL | 3 refills | Status: DC | PRN

## 2022-06-22 NOTE — Patient Instructions (Addendum)
Medication Instructions:  Your physician has recommended you make the following change in your medication:  1-STOP Norvasc (amlodipine)   *If you need a refill on your cardiac medications before your next appointment, please call your pharmacy*  Lab Work: If you have labs (blood work) drawn today and your tests are completely normal, you will receive your results only by: [place] (if you have MyChart) OR A paper copy in the mail If you have any lab test that is abnormal or we need to change your treatment, we will call you to review the results.  Testing/Procedures: Your physician has requested that you have an echocardiogram in April. Echocardiography is a painless test that uses sound waves to create images of your heart. It provides your doctor with information about the size and shape of your heart and how well your heart's chambers and valves are working. This procedure takes approximately one hour. There are no restrictions for this procedure.  Follow-Up: At [facility], you and your health needs are our priority.  As part of our continuing mission to provide you with exceptional heart care, we have created designated Provider Care Teams.  These Care Teams include your primary Cardiologist (physician) and Advanced Practice Providers (APPs -  Physician Assistants and Nurse Practitioners) who all work together to provide you with the care you need, when you need it.  We recommend signing up for the patient portal called "MyChart".  Sign up information is provided on this After Visit Summary.  MyChart is used to connect with patients for Virtual Visits (Telemedicine).  Patients are able to view lab/test results, encounter notes, upcoming appointments, etc.  Non-urgent messages can be sent to your provider as well.   To learn more about what you can do with MyChart, go to [email or web address].    Your next appointment:   6 weeks  The format for your next appointment:    In Person  Provider:   Jenkins Rouge, MD      Important Information About Sugar

## 2022-06-23 DIAGNOSIS — R5383 Other fatigue: Secondary | ICD-10-CM | POA: Diagnosis not present

## 2022-06-23 DIAGNOSIS — R110 Nausea: Secondary | ICD-10-CM | POA: Diagnosis not present

## 2022-06-23 DIAGNOSIS — K219 Gastro-esophageal reflux disease without esophagitis: Secondary | ICD-10-CM | POA: Diagnosis not present

## 2022-06-25 ENCOUNTER — Other Ambulatory Visit: Payer: Self-pay

## 2022-06-25 ENCOUNTER — Emergency Department (HOSPITAL_COMMUNITY): Payer: Medicare Other

## 2022-06-25 ENCOUNTER — Emergency Department (HOSPITAL_COMMUNITY)
Admission: EM | Admit: 2022-06-25 | Discharge: 2022-06-25 | Disposition: A | Discharge status: Home / Self Care | Payer: Medicare Other | Attending: Emergency Medicine | Admitting: Emergency Medicine

## 2022-06-25 ENCOUNTER — Encounter (HOSPITAL_COMMUNITY): Payer: Self-pay

## 2022-06-25 DIAGNOSIS — R110 Nausea: Secondary | ICD-10-CM | POA: Diagnosis not present

## 2022-06-25 DIAGNOSIS — R197 Diarrhea, unspecified: Secondary | ICD-10-CM | POA: Insufficient documentation

## 2022-06-25 DIAGNOSIS — I10 Essential (primary) hypertension: Secondary | ICD-10-CM | POA: Insufficient documentation

## 2022-06-25 DIAGNOSIS — Z85528 Personal history of other malignant neoplasm of kidney: Secondary | ICD-10-CM | POA: Insufficient documentation

## 2022-06-25 DIAGNOSIS — R1011 Right upper quadrant pain: Secondary | ICD-10-CM | POA: Diagnosis not present

## 2022-06-25 LAB — COMPREHENSIVE METABOLIC PANEL
ALT: 21 U/L (ref 0–44)
AST: 23 U/L (ref 15–41)
Albumin: 4.2 g/dL (ref 3.5–5.0)
Alkaline Phosphatase: 87 U/L (ref 38–126)
Anion gap: 7 (ref 5–15)
BUN: 9 mg/dL (ref 8–23)
CO2: 29 mmol/L (ref 22–32)
Calcium: 9.2 mg/dL (ref 8.9–10.3)
Chloride: 104 mmol/L (ref 98–111)
Creatinine, Ser: 0.66 mg/dL (ref 0.44–1.00)
GFR, Estimated: >60 mL/min (ref >60)
Glucose, Bld: 121 mg/dL — ABNORMAL HIGH (ref 70–99)
Potassium: 3.4 mmol/L — ABNORMAL LOW (ref 3.5–5.1)
Sodium: 140 mmol/L (ref 135–145)
Total Bilirubin: 0.9 mg/dL (ref 0.3–1.2)
Total Protein: 7.6 g/dL (ref 6.5–8.1)

## 2022-06-25 LAB — CBC
HCT: 39.8 % (ref 36.0–46.0)
Hemoglobin: 12.8 g/dL (ref 12.0–15.0)
MCH: 28.9 pg (ref 26.0–34.0)
MCHC: 32.2 g/dL (ref 30.0–36.0)
MCV: 89.8 fL (ref 80.0–100.0)
Platelets: 244 10*3/uL (ref 150–400)
RBC: 4.43 MIL/uL (ref 3.87–5.11)
RDW: 13.5 % (ref 11.5–15.5)
WBC: 6.9 10*3/uL (ref 4.0–10.5)
nRBC: 0 % (ref 0.0–0.2)

## 2022-06-25 LAB — LIPASE, BLOOD: Lipase: 31 U/L (ref 11–51)

## 2022-06-25 MED ORDER — ONDANSETRON HCL 4 MG/2ML IJ SOLN
4.0000 mg | Freq: Once | INTRAMUSCULAR | Status: AC
  Administered 2022-06-25: 4 mg via INTRAVENOUS
  Filled 2022-06-25: qty 2

## 2022-06-25 MED ORDER — SODIUM CHLORIDE 0.9 % IV BOLUS
500.0000 mL | Freq: Once | INTRAVENOUS | Status: AC
  Administered 2022-06-25: 500 mL via INTRAVENOUS

## 2022-06-25 MED ORDER — PROMETHAZINE HCL 12.5 MG PO TABS: 12.5000 mg | ORAL_TABLET | Freq: Four times a day (QID) | ORAL | 0 refills | Status: DC | PRN

## 2022-06-25 NOTE — ED Triage Notes (Signed)
Pt. Stated, [name] been nauseated all morning . Its off and on every morning. I had an episode like this about a month ago and went to [facility] ER. It was July 28. . They said I had a dilatated duct and scar tissue.

## 2022-06-25 NOTE — Discharge Instructions (Signed)
You were seen in the emergency room today with nausea.  I am changing her medication to Phenergan which may cause drowsiness.  Please do not take this and drive or take this with other sedating medicines.  Please follow with gastroenterology as scheduled and return with any new or suddenly worsening symptoms.

## 2022-06-25 NOTE — ED Provider Notes (Signed)
Emergency Department Provider Note   I have reviewed the triage vital signs and the nursing notes.   HISTORY  Chief Complaint Nausea   HPI [name] is a 66 y.o. female with PMH reviewed including prior TAVR and stroke presents with persistent nausea, worse in the morning along with occasional diarrhea.  Patient has had symptoms for several weeks to months with no clear diagnosis made.  She had prior ED evaluation showing dilated bile ducts but normal liver function tests.  CT showing no other acute findings.  Patient has GI follow-up on Friday but this morning was feeling particularly nauseated despite using her home Zofran and so presents for evaluation.  She does have some mild left-sided abdominal discomfort but nothing on the right.  No actual vomiting this morning.  No blood in the emesis or stool.  No fevers or chills.  She is not having discomfort in her chest.    Past Medical History:  Diagnosis Date   Diverticulitis, colon    GERD (gastroesophageal reflux disease)    Hypertension    Mild dilation of ascending aorta (HCC)    Pulmonary nodules    seen on pre TAVR CT, needs follow up    Renal cancer [facility]) 2007   Surgically removed from right kidney   S/P TAVR (transcatheter aortic valve replacement)    s/p TAVR w/ a Edwards Sapien 3 Ultra THV via the TF approach on 11/13/19 with Dr. Burt Knack and Cyndia Bent.    Severe aortic stenosis    Stroke (cerebrum) ([place])    a. MRI of brain 12/2021 [place] showed punctate subacute versus chronic white matter lacunar infarct in the posterior left corona radiata/centrum semiovale.    Review of Systems  Constitutional: No fever/chills Eyes: No visual changes. ENT: No sore throat. Cardiovascular: Denies chest pain. Respiratory: Denies shortness of breath. Gastrointestinal: Left sided abdominal pain. Positive severe nausea, no vomiting.  Intermittent diarrhea and constipation. Genitourinary: Negative for dysuria. Musculoskeletal:  Negative for back pain. Skin: Negative for rash. Neurological: Negative for headaches, focal weakness or numbness.  ____________________________________________   PHYSICAL EXAM:  VITAL SIGNS: ED Triage Vitals  Enc Vitals Group     BP 06/25/22 0858 (!) 145/69     Pulse Rate 06/25/22 0858 84     Resp 06/25/22 0858 17     Temp 06/25/22 0858 98.7 F (37.1 C)     Temp src --      SpO2 06/25/22 0858 96 %     Weight 06/25/22 0856 172 lb (78 kg)     Height 06/25/22 0856 '5\' 6"'$  (1.676 m)   Constitutional: Alert and oriented. Well appearing and in no acute distress. Eyes: Conjunctivae are normal. Head: Atraumatic. Nose: No congestion/rhinnorhea. Mouth/Throat: Mucous membranes are moist.   Neck: No stridor.   Cardiovascular: Normal rate, regular rhythm. Good peripheral circulation. Grossly normal heart sounds.   Respiratory: Normal respiratory effort.  No retractions. Lungs CTAB. Gastrointestinal: Soft and nontender. No distention.  Musculoskeletal: No lower extremity tenderness nor edema. No gross deformities of extremities. Neurologic:  Normal speech and language. No gross focal neurologic deficits are appreciated.  Skin:  Skin is warm, dry and intact. No rash noted.  ____________________________________________   LABS (all labs ordered are listed, but only abnormal results are displayed)  Labs Reviewed  COMPREHENSIVE METABOLIC PANEL - Abnormal; Notable for the following components:      Result Value   Potassium 3.4 (*)    Glucose, Bld 121 (*)    All  other components within normal limits  LIPASE, BLOOD  CBC  URINALYSIS, ROUTINE W REFLEX MICROSCOPIC   ____________________________________________  RADIOLOGY  US Abdomen Limited RUQ (LIVER/GB)  Result Date: 06/25/2022 CLINICAL DATA:  Right upper quadrant pain EXAM: ULTRASOUND ABDOMEN LIMITED RIGHT UPPER QUADRANT COMPARISON:  CT abdomen pelvis April 21, 2022 FINDINGS: Gallbladder: No gallstones or wall thickening visualized. No  sonographic Murphy sign noted by sonographer. Common bile duct: Diameter: Measures 13 mm in maximal dimension tapering to 8 mm near the pancreatic head. Liver: Increased echogenicity. No focal lesion. Portal vein is patent on color Doppler imaging with normal direction of blood flow towards the liver. Other: None. IMPRESSION: The common bile duct is dilated proximally measuring 13 mm tapering to 8 mm near the pancreatic head. Recommend correlation with LFTs. If abnormal, recommend further evaluation with MRI/MRCP. Increased hepatic parenchymal echogenicity suggestive of steatosis. Electronically Signed   By: Lovey Newcomer M.D.   On: 06/25/2022 10:16    ____________________________________________   PROCEDURES  Procedure(s) performed:   Procedures  None ____________________________________________   INITIAL IMPRESSION / ASSESSMENT AND PLAN / ED COURSE  Pertinent labs & imaging results that were available during my care of the patient were reviewed by me and considered in my medical decision making (see chart for details).   This patient is Presenting for Evaluation of abdominal pain/nausea, which does require a range of treatment options, and is a complaint that involves a high risk of morbidity and mortality.  The Differential Diagnoses includes but is not exclusive to acute cholecystitis, intrathoracic causes for epigastric abdominal pain, gastritis, duodenitis, pancreatitis, small bowel or large bowel obstruction, abdominal aortic aneurysm, hernia, gastritis, etc.   Critical Interventions-    Medications  sodium chloride 0.9 % bolus 500 mL (0 mLs Intravenous Stopped 06/25/22 1051)  ondansetron (ZOFRAN) injection 4 mg (4 mg Intravenous Given 06/25/22 0934)    Reassessment after intervention: Symptoms slightly improved. No vomiting.    I did obtain Additional Historical Information from husband at bedside.  I decided to review pertinent External Data, and in summary patient seen on 6/28  in the emergency department with CT scan at that time which I independently evaluated and agree with radiology interpretation.  Patient seen again on 7/3 at urgent care.  Was discharged home with Zofran 8 mg and loperamide.   Clinical Laboratory Tests Ordered, included CMP with normal LFTs and lipase. No leukocytosis.   Radiologic Tests Ordered, included RUQ [place]. I independently interpreted the images and agree with radiology interpretation.    Social Determinants of Health Risk patient is a non-smoker.  Consult complete with [facility] on call. No indication for other emergent imaging (MRCP). Plan for scheduled GI follow up.   Medical Decision Making: Summary:  Patient presents to the emergency department for evaluation of nausea with mild left-sided abdominal pain.  Symptoms have been going on since at least late June.  Lab work from prior ED visit shows normal LFTs.  Bile ducts slightly dilated on prior CT.  Plan for right upper quadrant ultrasound here and repeat blood work along with symptom management.  Abdominal exam is diffusely soft and nontender.  Specifically no Murphy sign.  Reevaluation with update and discussion with patient. Plan for phenergan at d/c. Discussed drowsy side effects. Patient will consider taking before bed or when she wakes in the night to reduce symptoms and help with AM nausea. GI follow up scheduled. Discussed strict ED return precautions.   Considered admission but labs are reassuring. Close follow  up confirmed. Coordinated with GI on call. Patient comfortable with the plan at discharge.   Disposition: discharge  ____________________________________________  FINAL CLINICAL IMPRESSION(S) / ED DIAGNOSES  Final diagnoses:  Nausea     NEW OUTPATIENT MEDICATIONS STARTED DURING THIS VISIT:  Discharge Medication List as of 06/25/2022  1:25 PM     START taking these medications   Details  promethazine (PHENERGAN) 12.5 MG tablet Take 1 tablet (12.5 mg total) by  mouth every 6 (six) hours as needed for nausea or vomiting., Starting Fri 06/25/2022, Normal        Note:  This document was prepared using Dragon voice recognition software and may include unintentional dictation errors.  Nanda Quinton, MD, [facility] Emergency [facility]    Danilo Cappiello, Wonda [facility], MD 06/25/22 [phone]

## 2022-07-01 DIAGNOSIS — M79642 Pain in left hand: Secondary | ICD-10-CM | POA: Diagnosis not present

## 2022-07-02 ENCOUNTER — Other Ambulatory Visit: Payer: Self-pay | Admitting: Gastroenterology

## 2022-07-02 ENCOUNTER — Encounter: Payer: Self-pay | Admitting: Cardiovascular Disease

## 2022-07-02 DIAGNOSIS — R935 Abnormal findings on diagnostic imaging of other abdominal regions, including retroperitoneum: Secondary | ICD-10-CM | POA: Diagnosis not present

## 2022-07-02 DIAGNOSIS — Z1211 Encounter for screening for malignant neoplasm of colon: Secondary | ICD-10-CM | POA: Diagnosis not present

## 2022-07-02 DIAGNOSIS — K5900 Constipation, unspecified: Secondary | ICD-10-CM | POA: Diagnosis not present

## 2022-07-02 DIAGNOSIS — K838 Other specified diseases of biliary tract: Secondary | ICD-10-CM

## 2022-07-02 DIAGNOSIS — R110 Nausea: Secondary | ICD-10-CM | POA: Diagnosis not present

## 2022-07-05 DIAGNOSIS — S61452A Open bite of left hand, initial encounter: Secondary | ICD-10-CM | POA: Diagnosis not present

## 2022-07-05 DIAGNOSIS — S62621A Displaced fracture of medial phalanx of left index finger, initial encounter for closed fracture: Secondary | ICD-10-CM | POA: Diagnosis not present

## 2022-07-05 DIAGNOSIS — M79642 Pain in left hand: Secondary | ICD-10-CM | POA: Diagnosis not present

## 2022-07-07 ENCOUNTER — Other Ambulatory Visit: Payer: Self-pay | Admitting: Cardiovascular Disease

## 2022-07-12 DIAGNOSIS — S61452A Open bite of left hand, initial encounter: Secondary | ICD-10-CM | POA: Diagnosis not present

## 2022-07-19 DIAGNOSIS — K649 Unspecified hemorrhoids: Secondary | ICD-10-CM | POA: Diagnosis not present

## 2022-07-19 DIAGNOSIS — K5730 Diverticulosis of large intestine without perforation or abscess without bleeding: Secondary | ICD-10-CM | POA: Diagnosis not present

## 2022-07-19 DIAGNOSIS — Z9889 Other specified postprocedural states: Secondary | ICD-10-CM

## 2022-07-19 DIAGNOSIS — Z980 Intestinal bypass and anastomosis status: Secondary | ICD-10-CM | POA: Diagnosis not present

## 2022-07-19 DIAGNOSIS — K635 Polyp of colon: Secondary | ICD-10-CM | POA: Diagnosis not present

## 2022-07-19 DIAGNOSIS — Z1211 Encounter for screening for malignant neoplasm of colon: Secondary | ICD-10-CM | POA: Diagnosis not present

## 2022-07-19 HISTORY — DX: Other specified postprocedural states: Z98.890

## 2022-07-21 ENCOUNTER — Ambulatory Visit
Admission: RE | Admit: 2022-07-21 | Discharge: 2022-07-21 | Disposition: A | Discharge status: Home / Self Care | Payer: Medicare Other | Source: Ambulatory Visit | Attending: Gastroenterology | Admitting: Gastroenterology

## 2022-07-21 DIAGNOSIS — R932 Abnormal findings on diagnostic imaging of liver and biliary tract: Secondary | ICD-10-CM | POA: Diagnosis not present

## 2022-07-21 DIAGNOSIS — K5730 Diverticulosis of large intestine without perforation or abscess without bleeding: Secondary | ICD-10-CM | POA: Diagnosis not present

## 2022-07-21 DIAGNOSIS — K760 Fatty (change of) liver, not elsewhere classified: Secondary | ICD-10-CM | POA: Diagnosis not present

## 2022-07-21 DIAGNOSIS — K635 Polyp of colon: Secondary | ICD-10-CM | POA: Diagnosis not present

## 2022-07-21 DIAGNOSIS — K838 Other specified diseases of biliary tract: Secondary | ICD-10-CM

## 2022-07-21 DIAGNOSIS — R935 Abnormal findings on diagnostic imaging of other abdominal regions, including retroperitoneum: Secondary | ICD-10-CM | POA: Diagnosis not present

## 2022-07-21 MED ORDER — GADOBENATE DIMEGLUMINE 529 MG/ML IV SOLN
15.0000 mL | Freq: Once | INTRAVENOUS | Status: AC | PRN
Start: 2022-07-21 — End: 2022-07-21
  Administered 2022-07-21: 15 mL via INTRAVENOUS

## 2022-07-22 DIAGNOSIS — M79642 Pain in left hand: Secondary | ICD-10-CM | POA: Diagnosis not present

## 2022-07-28 NOTE — Progress Notes (Signed)
Cardiology Office Note    Date:  08/03/2022   ID:  [name], [name] 04-08-1956, MRN [id number]  PCP:  Marda Stalker, PA-C  Cardiologist: Jenkins Rouge, MD / Dr. Burt Knack & Dr. Cyndia Bent (TAVR)  CC: Post TAVR   History of Present Illness:  [name] is a 66 y.o. female with a history of HTN, renal cell carcinoma s/p resection, and severe AS s/p TAVR (11/13/19) who presents to clinic for follow up.   TTE 07/2019  marked increase in mean gradient of 77 mmHg and peak gradient of 109 mmHg with a calculated aortic valve area of 0.76 cm. [facility] 08/22/19 showed normal cors and confirmed severe AS.   Underwent successful TAVR with a 23 mm Edwards Sapien 3 THV via the TF approach on 11/13/19. Post operative echo showed EF 65-70%, normally functioning TAVR with mean gradient of 19 mm hg and no PVL. She was discharged on aspirin and plavix.  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 Watched a nice YouTube tribute to her that was on [employer] news  Started back on beta blocker 11/16/21 as HR  elevated Monitor 01/27/22 no significant arrhythmias  Seen in ED 12/24/21 with naausea/vomiting and dizziness BP up MRI with punctate lacunar infarct carotids plaque no stenosis monitor no PAF    She feels fine now Ironically husband had a stroke as well involving vision in his left ey  More LE edema Has varicosities and has gained some weight 06/22/22 norvasc d/c and started on Lasix   Seen in ED 06/25/22 for nausea given phenergan MRI /CT/US no obvious etiology and d/c   Feels much better no GI symptoms and LE edema much better   Past  Medical History:  Diagnosis Date   Diverticulitis, colon    GERD (gastroesophageal reflux disease)    Hypertension    Mild dilation of ascending aorta (HCC)    Pulmonary nodules    seen on pre TAVR CT, needs follow up    Renal cancer ([place]) 2007   Surgically removed from right kidney   S/P TAVR (transcatheter aortic valve replacement)    s/p TAVR w/ a Edwards Sapien 3 Ultra THV via the TF approach on 11/13/19 with Dr. Burt Knack and Cyndia Bent.    Severe aortic stenosis    Stroke (cerebrum) ([place])    a. MRI of brain 12/2021 [facility] showed punctate subacute versus chronic white matter lacunar infarct in the posterior left corona radiata/centrum semiovale.    Past Surgical History:  Procedure Laterality Date   APPENDECTOMY     COLON SURGERY     HERNIA REPAIR     OVARIAN CYST SURGERY     RIGHT/LEFT HEART CATH AND CORONARY ANGIOGRAPHY N/A 08/22/2019   Procedure: RIGHT/LEFT HEART CATH AND CORONARY ANGIOGRAPHY;  Surgeon: Belva Crome, MD;  Location: [place] [facility];  Service: Cardiovascular;  Laterality: N/A;  TEE WITHOUT CARDIOVERSION N/A 11/13/2019   Procedure: TRANSESOPHAGEAL ECHOCARDIOGRAM (TEE);  Surgeon: Sherren Mocha, MD;  Location: [place] [facility];  Service: Open Heart Surgery;  Laterality: N/A;   TRANSCATHETER AORTIC VALVE REPLACEMENT, TRANSFEMORAL N/A 11/13/2019   Procedure: TRANSCATHETER AORTIC VALVE REPLACEMENT, TRANSFEMORAL;  Surgeon: Sherren Mocha, MD;  Location: [place] [facility];  Service: Open Heart Surgery;  Laterality: N/A;    Current Medications: Outpatient Medications Prior to Visit  Medication Sig Dispense Refill   aspirin EC 81 MG tablet Take 1 tablet (81 mg total) by mouth daily. Swallow whole. 90 tablet 3   atorvastatin (LIPITOR) 20 MG tablet Take 1 tablet (20 mg total) by mouth daily. 90 tablet 3   cetirizine (ZYRTEC) 10 MG tablet Take 10 mg by mouth daily.     fluticasone (FLONASE) 50 MCG/ACT nasal spray Place 1 spray into both nostrils as needed for allergies or  rhinitis.     furosemide (LASIX) 20 MG tablet Take 1 tablet (20 mg total) by mouth daily as needed. 90 tablet 3   magnesium oxide (MAG-OX) 400 MG tablet Take 1 tablet (400 mg total) by mouth daily. 30 tablet 11   metoprolol succinate (TOPROL-XL) 50 MG 24 hr tablet Take 1 tablet (50 mg total) by mouth daily. Take with or immediately following a meal. 90 tablet 3   omeprazole (PRILOSEC) 40 MG capsule TAKE ONE CAPSULE BY MOUTH DAILY 90 capsule 1   OVER THE COUNTER MEDICATION Take 1 capsule by mouth daily. Tru niagen     OVER THE COUNTER MEDICATION Take 1 tablet by mouth daily at 6 (six) AM. Trans pterostilbene     promethazine (PHENERGAN) 12.5 MG tablet Take 1 tablet (12.5 mg total) by mouth every 6 (six) hours as needed for nausea or vomiting. 20 tablet 0   potassium chloride SA (KLOR-CON M20) 20 MEQ tablet Take 1 tablet (20 mEq total) by mouth daily for 3 days. Take 1 tablet daily for 3 days. 30 tablet 0   No facility-administered medications prior to visit.     Allergies:   Diphenhydramine hcl, Protonix [pantoprazole], Ciprofloxacin, Codeine, Diphenhydramine, and Morphine and related   Social History   Socioeconomic History   Marital status: Married    Spouse name: Not on file   Number of children: Not on file   Years of education: Not on file   Highest education level: Not on file  Occupational History   Not on file  Tobacco Use   Smoking status: Former    Packs/day: 1.00    Years: 10.00    Total pack years: 10.00    Types: Cigarettes    Quit date: 10/25/2005    Years since quitting: 16.7   Smokeless tobacco: Never   Tobacco comments:    quit around 2007   Vaping Use   Vaping Use: Never used  Substance and Sexual Activity   Alcohol use: Yes    Comment: occ   Drug use: No   Sexual activity: Not on file  Other Topics Concern   Not on file  Social History Narrative   Right handed    Caffeine 1 1/2 cup daily   Live in one story with husband   Social Determinants of Health    Financial Resource Strain: Not on file  Food Insecurity: Not on file  Transportation Needs: Not on file  Physical Activity: Not on file  Stress: Not on file  Social Connections: Not on file     Family History:  The  patient's family history includes Cancer in her father, mother, and sister; Heart attack in her paternal aunt and paternal grandfather; Hypertension in her father.     ROS:   Please see the history of present illness.    ROS All other systems reviewed and are negative.   PHYSICAL EXAM:   VS:  Ht '5\' 6"'$  (1.676 m)   Wt 167 lb (75.8 kg)   BMI 26.95 kg/m     Affect appropriate Healthy:  appears stated age 89: normal Neck supple with no adenopathy JVP normal no bruits no thyromegaly Lungs clear with no wheezing and good diaphragmatic motion Heart:  S1/S2 SEM through TAVR valve no AR  murmur, no rub, gallop or click PMI normal Abdomen: benighn, BS positve, no tenderness, no AAA no bruit.  No HSM or HJR Distal pulses intact with no bruits Plus one bilateral edema with varicosities  Neuro non-focal Skin warm and dry No muscular weakness    Wt Readings from Last 3 Encounters:  08/03/22 167 lb (75.8 kg)  06/25/22 172 lb (78 kg)  06/22/22 174 lb (78.9 kg)      Studies/Labs Reviewed:   EKG:  SR rate 84 bpm  LAD otherwise normal 10/30/20  Recent Labs: 01/01/2022: TSH 1.680 01/15/2022: NT-Pro BNP 102 03/17/2022: Magnesium 1.9 06/25/2022: ALT 21; BUN 9; Creatinine, Ser 0.66; Hemoglobin 12.8; Platelets 244; Potassium 3.4; Sodium 140   Lipid Panel    Component Value Date/Time   CHOL 154 05/13/2022 0740   TRIG 96 05/13/2022 0740   HDL 48 05/13/2022 0740   CHOLHDL 3.2 05/13/2022 0740   LDLCALC 88 05/13/2022 0740    Additional studies/ records that were reviewed today include:  TAVR OPERATIVE NOTE     Date of Procedure:                11/13/2019   Preoperative Diagnosis:      Severe Aortic Stenosis    Postoperative Diagnosis:    Same    Procedure:         Transcatheter Aortic Valve Replacement - Percutaneous Right Transfemoral Approach             Edwards Sapien 3 Ultra THV (size 23 mm, model # 9750TFX, serial # [id number])              Co-Surgeons:                        [name], MD and Sherren Mocha, MD     Anesthesiologist:                  Suella Broad, MD   Echocardiographer:              Ena Dawley, MD   Pre-operative Echo Findings: Severe aortic stenosis Normal left ventricular systolic function   Post-operative Echo Findings: No paravalvular leak Normal left ventricular systolic function   _____________     Echo  . 01/27/22  IMPRESSIONS     1. Left ventricular ejection fraction, by estimation, is 65 to 70%. The  left ventricle has normal function. The left ventricle has no regional  wall motion abnormalities. There is mild left ventricular hypertrophy.  Left ventricular diastolic parameters  were normal.   2. Right ventricular systolic function is normal. The right ventricular  size is normal.   3. Left atrial size was mildly dilated.   4. The mitral valve is normal in structure. Trivial mitral valve  regurgitation. No evidence  of mitral stenosis.   5. Post TAVR with 23 mm Sapien 3 gradients similar to TTE done 10/08/21  No PVL. The aortic valve has been repaired/replaced. Aortic valve  regurgitation is not visualized. No aortic stenosis is present.   6. The inferior vena cava is normal in size with greater than 50%  respiratory variability, suggesting right atrial pressure of 3 mmHg.  ASSESSMENT & PLAN:   Severe AS s/p TAVR: SBE, plavix DC  stable gradients no PVL on TTE 01/27/22    HTN: Lasix started norvasc d/c due to edema much improved   GERD: back on prolosec discussed low carb diet and meal portions    Pulmonary nodules: these are followed closely by Dr. Brock Ra. He recommended another CT scan in 2 years.   CVA:  lacunar ? Related to small vessel dx continue ASA, statin and good BP control  F/U Dr Tomi Likens neurology   Echo :  post TAVR 01/2023  Stop Norvasc  F/U in  6-8 weeks      Signed, Jenkins Rouge, MD  08/03/2022 10:08 AM    [address], [place], [place]  [address] Phone: [phone]; Fax: [phone]

## 2022-07-29 DIAGNOSIS — M79642 Pain in left hand: Secondary | ICD-10-CM | POA: Diagnosis not present

## 2022-08-03 ENCOUNTER — Ambulatory Visit: Payer: Medicare Other | Attending: Cardiovascular Disease | Admitting: Cardiovascular Disease

## 2022-08-03 ENCOUNTER — Encounter: Payer: Self-pay | Admitting: Cardiovascular Disease

## 2022-08-03 VITALS — BP 142/80 | HR 80 | Ht 66.0 in | Wt 167.0 lb

## 2022-08-03 DIAGNOSIS — Z952 Presence of prosthetic heart valve: Secondary | ICD-10-CM

## 2022-08-03 DIAGNOSIS — I10 Essential (primary) hypertension: Secondary | ICD-10-CM

## 2022-08-03 NOTE — Patient Instructions (Signed)
Medication Instructions:  Your physician recommends that you continue on your current medications as directed. Please refer to the Current Medication list given to you today.  *If you need a refill on your cardiac medications before your next appointment, please call your pharmacy*  Lab Work: If you have labs (blood work) drawn today and your tests are completely normal, you will receive your results only by: MyChart Message (if you have MyChart) OR A paper copy in the mail If you have any lab test that is abnormal or we need to change your treatment, we will call you to review the results.  Testing/Procedures: None ordered today.  Follow-Up: At [facility], you and your health needs are our priority.  As part of our continuing mission to provide you with exceptional heart care, we have created designated Provider Care Teams.  These Care Teams include your primary Cardiologist (physician) and Advanced Practice Providers (APPs -  Physician Assistants and Nurse Practitioners) who all work together to provide you with the care you need, when you need it.  We recommend signing up for the patient portal called "MyChart".  Sign up information is provided on this After Visit Summary.  MyChart is used to connect with patients for Virtual Visits (Telemedicine).  Patients are able to view lab/test results, encounter notes, upcoming appointments, etc.  Non-urgent messages can be sent to your provider as well.   To learn more about what you can do with MyChart, go to [email or web address].    Your next appointment:    6 month(s)  The format for your next appointment:   In Person  Provider:   Peter Nishan, MD     Important Information About Sugar       

## 2022-08-09 DIAGNOSIS — R2232 Localized swelling, mass and lump, left upper limb: Secondary | ICD-10-CM | POA: Diagnosis not present

## 2022-08-09 DIAGNOSIS — M67442 Ganglion, left hand: Secondary | ICD-10-CM | POA: Diagnosis not present

## 2022-08-09 DIAGNOSIS — S61452A Open bite of left hand, initial encounter: Secondary | ICD-10-CM | POA: Diagnosis not present

## 2022-08-19 DIAGNOSIS — M79642 Pain in left hand: Secondary | ICD-10-CM | POA: Diagnosis not present

## 2022-08-24 DIAGNOSIS — M79642 Pain in left hand: Secondary | ICD-10-CM | POA: Diagnosis not present

## 2022-09-02 DIAGNOSIS — M79642 Pain in left hand: Secondary | ICD-10-CM | POA: Diagnosis not present

## 2022-09-09 DIAGNOSIS — M79642 Pain in left hand: Secondary | ICD-10-CM | POA: Diagnosis not present

## 2022-09-14 ENCOUNTER — Ambulatory Visit: Payer: Medicare Other | Attending: Nurse Practitioner

## 2022-09-14 DIAGNOSIS — I2510 Atherosclerotic heart disease of native coronary artery without angina pectoris: Secondary | ICD-10-CM | POA: Diagnosis not present

## 2022-09-14 DIAGNOSIS — I6523 Occlusion and stenosis of bilateral carotid arteries: Secondary | ICD-10-CM | POA: Diagnosis not present

## 2022-09-14 DIAGNOSIS — Z79899 Other long term (current) drug therapy: Secondary | ICD-10-CM

## 2022-09-14 DIAGNOSIS — M79642 Pain in left hand: Secondary | ICD-10-CM | POA: Diagnosis not present

## 2022-09-14 LAB — LIPID PANEL
Chol/HDL Ratio: 4 ratio (ref 0.0–4.4)
Cholesterol, Total: 160 mg/dL (ref 100–199)
HDL: 40 mg/dL (ref >39)
LDL Chol Calc (NIH): 104 mg/dL — ABNORMAL HIGH (ref 0–99)
Triglycerides: 82 mg/dL (ref 0–149)
VLDL Cholesterol Cal: 16 mg/dL (ref 5–40)

## 2022-09-14 LAB — HEPATIC FUNCTION PANEL
ALT: 21 IU/L (ref 0–32)
AST: 20 IU/L (ref 0–40)
Albumin: 4.7 g/dL (ref 3.9–4.9)
Alkaline Phosphatase: 108 IU/L (ref 44–121)
Bilirubin Total: 0.4 mg/dL (ref 0.0–1.2)
Bilirubin, Direct: 0.12 mg/dL (ref 0.00–0.40)
Total Protein: 7.4 g/dL (ref 6.0–8.5)

## 2022-09-15 ENCOUNTER — Other Ambulatory Visit: Payer: Self-pay

## 2022-09-15 DIAGNOSIS — I6523 Occlusion and stenosis of bilateral carotid arteries: Secondary | ICD-10-CM

## 2022-09-15 DIAGNOSIS — Z79899 Other long term (current) drug therapy: Secondary | ICD-10-CM

## 2022-09-15 DIAGNOSIS — I2510 Atherosclerotic heart disease of native coronary artery without angina pectoris: Secondary | ICD-10-CM

## 2022-09-15 MED ORDER — ATORVASTATIN CALCIUM 40 MG PO TABS: 40.0000 mg | ORAL_TABLET | Freq: Every day | ORAL | 1 refills | Status: DC

## 2022-10-04 DIAGNOSIS — K838 Other specified diseases of biliary tract: Secondary | ICD-10-CM | POA: Diagnosis not present

## 2022-11-09 ENCOUNTER — Ambulatory Visit: Payer: Medicare Other | Attending: Nurse Practitioner

## 2022-11-09 DIAGNOSIS — I6523 Occlusion and stenosis of bilateral carotid arteries: Secondary | ICD-10-CM | POA: Diagnosis not present

## 2022-11-09 DIAGNOSIS — I2510 Atherosclerotic heart disease of native coronary artery without angina pectoris: Secondary | ICD-10-CM | POA: Diagnosis not present

## 2022-11-09 DIAGNOSIS — Z79899 Other long term (current) drug therapy: Secondary | ICD-10-CM | POA: Diagnosis not present

## 2022-11-09 LAB — HEPATIC FUNCTION PANEL
ALT: 12 IU/L (ref 0–32)
AST: 11 IU/L (ref 0–40)
Albumin: 4.4 g/dL (ref 3.9–4.9)
Alkaline Phosphatase: 105 IU/L (ref 44–121)
Bilirubin Total: 0.4 mg/dL (ref 0.0–1.2)
Bilirubin, Direct: 0.12 mg/dL (ref 0.00–0.40)
Total Protein: 7.3 g/dL (ref 6.0–8.5)

## 2022-11-09 LAB — LIPID PANEL
Chol/HDL Ratio: 3.3 ratio (ref 0.0–4.4)
Cholesterol, Total: 126 mg/dL (ref 100–199)
HDL: 38 mg/dL — ABNORMAL LOW (ref >39)
LDL Chol Calc (NIH): 71 mg/dL (ref 0–99)
Triglycerides: 88 mg/dL (ref 0–149)
VLDL Cholesterol Cal: 17 mg/dL (ref 5–40)

## 2022-12-19 ENCOUNTER — Other Ambulatory Visit: Payer: Self-pay | Admitting: Cardiovascular Disease

## 2022-12-21 ENCOUNTER — Encounter: Payer: Self-pay | Admitting: Cardiovascular Disease

## 2022-12-21 DIAGNOSIS — Z23 Encounter for immunization: Secondary | ICD-10-CM | POA: Diagnosis not present

## 2022-12-21 DIAGNOSIS — Z0000 Encounter for general adult medical examination without abnormal findings: Secondary | ICD-10-CM | POA: Diagnosis not present

## 2022-12-22 ENCOUNTER — Other Ambulatory Visit: Payer: Self-pay | Admitting: Family Medicine

## 2022-12-22 ENCOUNTER — Encounter: Payer: Self-pay | Admitting: Cardiovascular Disease

## 2022-12-22 DIAGNOSIS — Z1231 Encounter for screening mammogram for malignant neoplasm of breast: Secondary | ICD-10-CM

## 2022-12-22 DIAGNOSIS — E2839 Other primary ovarian failure: Secondary | ICD-10-CM

## 2022-12-29 ENCOUNTER — Other Ambulatory Visit: Payer: Self-pay | Admitting: Cardiovascular Disease

## 2023-01-25 ENCOUNTER — Other Ambulatory Visit: Payer: Self-pay | Admitting: Gastroenterology

## 2023-01-25 DIAGNOSIS — K838 Other specified diseases of biliary tract: Secondary | ICD-10-CM

## 2023-02-04 ENCOUNTER — Ambulatory Visit
Admission: RE | Admit: 2023-02-04 | Discharge: 2023-02-04 | Disposition: A | Discharge status: Home / Self Care | Payer: Medicare Other | Source: Ambulatory Visit | Attending: Family Medicine | Admitting: Family Medicine

## 2023-02-04 DIAGNOSIS — Z1231 Encounter for screening mammogram for malignant neoplasm of breast: Secondary | ICD-10-CM

## 2023-02-07 ENCOUNTER — Ambulatory Visit (HOSPITAL_COMMUNITY): Payer: Medicare Other | Attending: Cardiology

## 2023-02-07 DIAGNOSIS — Z952 Presence of prosthetic heart valve: Secondary | ICD-10-CM | POA: Diagnosis not present

## 2023-02-07 DIAGNOSIS — I10 Essential (primary) hypertension: Secondary | ICD-10-CM | POA: Diagnosis not present

## 2023-02-07 DIAGNOSIS — I350 Nonrheumatic aortic (valve) stenosis: Secondary | ICD-10-CM | POA: Insufficient documentation

## 2023-02-07 LAB — ECHOCARDIOGRAM COMPLETE
AR max vel: 1 cm2
AV Area VTI: 1.13 cm2
AV Area mean vel: 1.1 cm2
AV Mean grad: 22 mmHg
AV Peak grad: 44.1 mmHg
Ao pk vel: 3.32 m/s
Area-P 1/2: 2.58 cm2
S' Lateral: 3.2 cm

## 2023-02-09 ENCOUNTER — Encounter: Payer: Self-pay | Admitting: Family Medicine

## 2023-02-09 ENCOUNTER — Other Ambulatory Visit: Payer: Self-pay | Admitting: Family Medicine

## 2023-02-09 DIAGNOSIS — R928 Other abnormal and inconclusive findings on diagnostic imaging of breast: Secondary | ICD-10-CM

## 2023-02-14 ENCOUNTER — Telehealth: Payer: Self-pay

## 2023-02-14 DIAGNOSIS — Z952 Presence of prosthetic heart valve: Secondary | ICD-10-CM

## 2023-02-14 DIAGNOSIS — I350 Nonrheumatic aortic (valve) stenosis: Secondary | ICD-10-CM

## 2023-02-14 NOTE — Telephone Encounter (Signed)
-----   Message from Wendall Stade, MD sent at 02/13/2023 12:00 PM EDT ----- Gradient across TAVR valve stable but a bit high repeat TTE in 6 months may need CTA if they stay high

## 2023-02-14 NOTE — Telephone Encounter (Signed)
The patient has been notified of the result and verbalized understanding.  All questions (if any) were answered. Cindi Carbon [place], RN 02/14/2023 2:11 PM   Will place order for echo in 6 months.

## 2023-02-21 ENCOUNTER — Ambulatory Visit
Admission: RE | Admit: 2023-02-21 | Discharge: 2023-02-21 | Disposition: A | Discharge status: Home / Self Care | Payer: Medicare Other | Source: Ambulatory Visit | Attending: Family Medicine | Admitting: Family Medicine

## 2023-02-21 ENCOUNTER — Ambulatory Visit: Payer: Medicare Other

## 2023-02-21 DIAGNOSIS — N6489 Other specified disorders of breast: Secondary | ICD-10-CM | POA: Diagnosis not present

## 2023-02-21 DIAGNOSIS — R928 Other abnormal and inconclusive findings on diagnostic imaging of breast: Secondary | ICD-10-CM

## 2023-02-25 ENCOUNTER — Ambulatory Visit
Admission: RE | Admit: 2023-02-25 | Discharge: 2023-02-25 | Disposition: A | Discharge status: Home / Self Care | Payer: Medicare Other | Source: Ambulatory Visit | Attending: Gastroenterology | Admitting: Gastroenterology

## 2023-02-25 DIAGNOSIS — K838 Other specified diseases of biliary tract: Secondary | ICD-10-CM

## 2023-02-25 DIAGNOSIS — K769 Liver disease, unspecified: Secondary | ICD-10-CM | POA: Diagnosis not present

## 2023-02-25 MED ORDER — GADOPICLENOL 0.5 MMOL/ML IV SOLN
8.0000 mL | Freq: Once | INTRAVENOUS | Status: AC | PRN
  Administered 2023-02-25: 8 mL via INTRAVENOUS

## 2023-05-05 ENCOUNTER — Other Ambulatory Visit: Payer: Self-pay | Admitting: Nurse Practitioner

## 2023-05-25 NOTE — Progress Notes (Signed)
Cardiology Office Note    Date:  05/30/2023   ID:  [name], [name] [birth year], MRN [id number]  PCP:  Jarrett Soho, PA-C  Cardiologist: Charlton Haws, MD / Dr. Excell Seltzer & Dr. Laneta Simmers (TAVR)  CC: Post TAVR   History of Present Illness:  [name] is a 67 y.o. female with a history of HTN, renal cell carcinoma s/p resection, and severe AS s/p TAVR (11/13/19) who presents to clinic for follow up.   TTE 07/2019  marked increase in mean gradient of 77 mmHg and peak gradient of 109 mmHg with a calculated aortic valve area of 0.76 cm. [facility] 08/22/19 showed normal cors and confirmed severe AS.   Underwent successful TAVR with a 23 mm Edwards Sapien 3 THV via the TF approach on 11/13/19. Post operative echo showed EF 65-70%, normally functioning TAVR with mean gradient of 19 mm hg and no PVL. She was discharged on aspirin and plavix.  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 Watched a nice YouTube tribute to her that was on [employer] news  Started back on beta blocker 11/16/21 as HR  elevated Monitor 01/27/22 no significant arrhythmias  Seen in ED 12/24/21 with naausea/vomiting and dizziness BP up MRI with punctate lacunar infarct carotids plaque no stenosis monitor no PAF   She feels fine now Ironically husband had a stroke as well involving vision in his left ey  More LE edema Has varicosities and has gained some weight 06/22/22 norvasc d/c and started on Lasix   Seen in ED 06/25/22 for nausea given phenergan MRI /CT/US no obvious etiology and d/c    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 Excell Seltzer and Laneta Simmers.    Severe aortic stenosis    Stroke (cerebrum) (HCC)    a. MRI of brain 12/2021 [facility] showed punctate subacute versus chronic white matter lacunar infarct in the posterior left corona radiata/centrum semiovale.    Past Surgical History:  Procedure Laterality Date   APPENDECTOMY     COLON SURGERY     HERNIA REPAIR     OVARIAN CYST SURGERY     RIGHT/LEFT HEART CATH AND CORONARY ANGIOGRAPHY N/A 08/22/2019   Procedure: RIGHT/LEFT HEART CATH AND CORONARY ANGIOGRAPHY;  Surgeon: Lyn Records, MD;  Location: [facility] INVASIVE CV LAB;  Service: Cardiovascular;  Laterality: N/A;   TEE WITHOUT CARDIOVERSION N/A 11/13/2019   Procedure: TRANSESOPHAGEAL ECHOCARDIOGRAM (TEE);  Surgeon: Tonny Bollman, MD;  Location: [facility] INVASIVE CV LAB;  Service: Open Heart Surgery;  Laterality: N/A;   TRANSCATHETER AORTIC VALVE REPLACEMENT, TRANSFEMORAL N/A 11/13/2019   Procedure: TRANSCATHETER AORTIC VALVE REPLACEMENT, TRANSFEMORAL;  Surgeon: Tonny Bollman, MD;  Location: [facility] INVASIVE CV LAB;  Service: Open Heart Surgery;  Laterality: N/A;    Current Medications: Outpatient Medications Prior to Visit  Medication Sig Dispense Refill   aspirin EC 81 MG tablet Take 1 tablet (81 mg total) by mouth daily. Swallow whole. 90 tablet 3   atorvastatin (LIPITOR) 40 MG tablet TAKE 1 TABLET BY MOUTH DAILY 90 tablet 1   cetirizine (ZYRTEC) 10 MG tablet Take 10 mg by mouth daily.     fluticasone (FLONASE) 50  MCG/ACT nasal spray Place 1 spray into both nostrils as needed for allergies or rhinitis.     magnesium oxide (MAG-OX) 400 MG tablet Take 1 tablet (400 mg total) by mouth daily. 30 tablet 11   metoprolol succinate (TOPROL-XL) 50 MG 24 hr tablet TAKE 1 TABLET (50 MG TOTAL) BY MOUTH DAILY. TAKE WITH OR IMMEDIATELY FOLLOWING A MEAL. 90 tablet 2   omeprazole (PRILOSEC) 40 MG capsule TAKE 1 CAPSULE BY MOUTH DAILY 90 capsule 2   OVER THE COUNTER MEDICATION Take 1 capsule by mouth daily. Tru niagen     OVER THE COUNTER MEDICATION Take 1 tablet by mouth daily at 6 (six) AM. Trans pterostilbene     furosemide (LASIX) 20 MG tablet Take 1 tablet (20 mg total) by mouth daily as needed. 90 tablet 3   potassium chloride SA (KLOR-CON M20) 20 MEQ tablet Take 1 tablet (20 mEq total) by mouth daily for 3 days. Take 1 tablet daily for 3 days. 30 tablet 0   promethazine (PHENERGAN) 12.5 MG tablet Take 1 tablet (12.5 mg total) by mouth every 6 (six) hours as needed for nausea or vomiting. 20 tablet 0   No facility-administered medications prior to visit.     Allergies:   Diphenhydramine hcl, Protonix [pantoprazole], Ciprofloxacin, Codeine, Diphenhydramine, and Morphine and codeine   Social History   Socioeconomic History   Marital status: Married    Spouse name: Not on file   Number of children: Not on file   Years of education: Not on file   Highest education level: Not on file  Occupational History   Not on file  Tobacco Use   Smoking status: Former    Current packs/day: 0.00    Average packs/day: 1 pack/day for 10.0 years (10.0 ttl pk-yrs)    Types: Cigarettes    Start date: 10/26/1995    Quit date: 10/25/2005    Years since quitting: 17.6   Smokeless tobacco: Never   Tobacco comments:    quit around 2007   Vaping Use   Vaping status: Never Used  Substance and Sexual Activity   Alcohol use: Yes    Comment: occ   Drug use: No   Sexual activity: Not on file  Other Topics Concern   Not on file   Social History Narrative   Right handed    Caffeine 1 1/2 cup daily   Live in one story with husband   Social Determinants of Health   Financial Resource Strain: Not on file  Food Insecurity: Not on file  Transportation Needs: Not on file  Physical Activity: Not on file  Stress: Not on file  Social Connections: Not on file     Family History:  The patient's family history includes Breast cancer in her mother and sister; Cancer in her father, mother, and sister; Heart attack in her paternal aunt and paternal grandfather; Hypertension in her father.     ROS:   Please see the history of present illness.    ROS All other systems reviewed and are negative.   PHYSICAL EXAM:   VS:  BP 126/78   Pulse 72   Ht 5\' 6"  (1.676 m)   Wt 182 lb (82.6 kg)   SpO2 96%   BMI 29.38 kg/m     Affect appropriate Healthy:  appears stated age HEENT: normal Neck supple with no adenopathy JVP normal no bruits no thyromegaly Lungs clear with no wheezing and good diaphragmatic motion Heart:  S1/S2 SEM through TAVR valve no AR  murmur, no rub, gallop or click PMI normal Abdomen: benighn, BS positve, no tenderness, no AAA no bruit.  No HSM or HJR Distal pulses intact with no bruits Plus one bilateral edema with varicosities  Neuro non-focal Skin warm and dry No muscular weakness    Wt Readings from Last 3 Encounters:  05/30/23 182 lb (82.6 kg)  08/03/22 167 lb (75.8 kg)  06/25/22 172 lb (78 kg)      Studies/Labs Reviewed:   EKG:  SR rate 84 bpm  LAD otherwise normal 10/30/20  Recent Labs: 06/25/2022: BUN 9; Creatinine, Ser 0.66; Hemoglobin 12.8; Platelets 244; Potassium 3.4; Sodium 140 11/09/2022: ALT 12   Lipid Panel    Component Value Date/Time   CHOL 126 11/09/2022 1017   TRIG 88 11/09/2022 1017   HDL 38 (L) 11/09/2022 1017   CHOLHDL 3.3 11/09/2022 1017   LDLCALC 71 11/09/2022 1017    Additional studies/ records that were reviewed today include:  TAVR OPERATIVE NOTE      Date of Procedure:                11/13/2019   Preoperative Diagnosis:      Severe Aortic Stenosis    Postoperative Diagnosis:    Same    Procedure:        Transcatheter Aortic Valve Replacement - Percutaneous Right Transfemoral Approach             Edwards Sapien 3 Ultra THV (size 23 mm, model # 9750TFX, serial # [id number])              Co-Surgeons:                        Alleen Borne, MD and Tonny Bollman, MD     Anesthesiologist:                  Shona Simpson, MD   Echocardiographer:              Tobias Alexander, MD   Pre-operative Echo Findings: Severe aortic stenosis Normal left ventricular systolic function   Post-operative Echo Findings: No paravalvular leak Normal left ventricular systolic function   _____________     Echo  . 02/07/23   AV Area (Vmax):    1.00 cm  AV Area (Vmean):   1.10 cm  AV Area (VTI):     1.13 cm  AV Vmax:           332.00 cm/s  AV Vmean:  215.000 cm/s  AV VTI:            0.655 m  AV Peak Grad:      44.1 mmHg  AV Mean Grad:      22.0 mmHg  LVOT Vmax:         117.00 cm/s  LVOT Vmean:        83.500 cm/s  LVOT VTI:          0.262 m  LVOT/AV VTI ratio: 0.40   ASSESSMENT & PLAN:   Severe AS s/p TAVR: 11/13/19 23 mm Sapien 3 valve gradients have been high but stable Discussed CTA to r/o HALT/HAM but she feels symptoms don't warrant this currently   HTN: Lasix started norvasc d/c due to edema much improved   GERD: back on prolosec discussed low carb diet and meal portions    Pulmonary nodules: these are followed closely by Dr. Inis Sizer. He recommended another CT scan in 2 years.   CVA:  lacunar ? Related to small vessel dx continue ASA, statin and good BP control F/U Dr Everlena Cooper neurology     F/U in  6 months      Signed, Charlton Haws, MD  05/30/2023 10:42 AM    [facility] [address] [place], [place], [place]  [address] Phone: [phone]; Fax: [phone]

## 2023-05-30 ENCOUNTER — Ambulatory Visit: Payer: Medicare Other | Attending: Cardiovascular Disease | Admitting: Cardiovascular Disease

## 2023-05-30 ENCOUNTER — Encounter: Payer: Self-pay | Admitting: Cardiovascular Disease

## 2023-05-30 VITALS — BP 126/78 | HR 72 | Ht 66.0 in | Wt 182.0 lb

## 2023-05-30 DIAGNOSIS — Z952 Presence of prosthetic heart valve: Secondary | ICD-10-CM | POA: Diagnosis not present

## 2023-05-30 DIAGNOSIS — I6381 Other cerebral infarction due to occlusion or stenosis of small artery: Secondary | ICD-10-CM | POA: Diagnosis not present

## 2023-05-30 DIAGNOSIS — I10 Essential (primary) hypertension: Secondary | ICD-10-CM | POA: Diagnosis not present

## 2023-05-30 NOTE — Patient Instructions (Signed)
Medication Instructions:  Your physician recommends that you continue on your current medications as directed. Please refer to the Current Medication list given to you today.  *If you need a refill on your cardiac medications before your next appointment, please call your pharmacy*  Lab Work: If you have labs (blood work) drawn today and your tests are completely normal, you will receive your results only by: MyChart Message (if you have MyChart) OR A paper copy in the mail If you have any lab test that is abnormal or we need to change your treatment, we will call you to review the results.  Testing/Procedures: None ordered today.  Follow-Up: At [facility], you and your health needs are our priority.  As part of our continuing mission to provide you with exceptional heart care, we have created designated Provider Care Teams.  These Care Teams include your primary Cardiologist (physician) and Advanced Practice Providers (APPs -  Physician Assistants and Nurse Practitioners) who all work together to provide you with the care you need, when you need it.  We recommend signing up for the patient portal called "MyChart".  Sign up information is provided on this After Visit Summary.  MyChart is used to connect with patients for Virtual Visits (Telemedicine).  Patients are able to view lab/test results, encounter notes, upcoming appointments, etc.  Non-urgent messages can be sent to your provider as well.   To learn more about what you can do with MyChart, go to [email or web address].    Your next appointment:   6 month(s)  Provider:   Peter Nishan, MD      

## 2023-06-01 ENCOUNTER — Ambulatory Visit
Admission: RE | Admit: 2023-06-01 | Discharge: 2023-06-01 | Disposition: A | Discharge status: Home / Self Care | Payer: Medicare Other | Source: Ambulatory Visit | Attending: Family Medicine | Admitting: Family Medicine

## 2023-06-01 DIAGNOSIS — E2839 Other primary ovarian failure: Secondary | ICD-10-CM | POA: Diagnosis not present

## 2023-06-01 DIAGNOSIS — N958 Other specified menopausal and perimenopausal disorders: Secondary | ICD-10-CM | POA: Diagnosis not present

## 2023-06-01 DIAGNOSIS — M8588 Other specified disorders of bone density and structure, other site: Secondary | ICD-10-CM | POA: Diagnosis not present

## 2023-06-16 DIAGNOSIS — Z85528 Personal history of other malignant neoplasm of kidney: Secondary | ICD-10-CM | POA: Diagnosis not present

## 2023-06-16 DIAGNOSIS — Z952 Presence of prosthetic heart valve: Secondary | ICD-10-CM | POA: Diagnosis not present

## 2023-06-16 DIAGNOSIS — K219 Gastro-esophageal reflux disease without esophagitis: Secondary | ICD-10-CM | POA: Diagnosis not present

## 2023-06-16 DIAGNOSIS — I10 Essential (primary) hypertension: Secondary | ICD-10-CM | POA: Diagnosis not present

## 2023-06-16 DIAGNOSIS — I350 Nonrheumatic aortic (valve) stenosis: Secondary | ICD-10-CM | POA: Diagnosis not present

## 2023-06-16 DIAGNOSIS — R5383 Other fatigue: Secondary | ICD-10-CM | POA: Diagnosis not present

## 2023-08-16 ENCOUNTER — Ambulatory Visit (HOSPITAL_COMMUNITY): Payer: Medicare Other | Attending: Cardiovascular Disease

## 2023-08-16 DIAGNOSIS — Z952 Presence of prosthetic heart valve: Secondary | ICD-10-CM | POA: Insufficient documentation

## 2023-08-16 DIAGNOSIS — I350 Nonrheumatic aortic (valve) stenosis: Secondary | ICD-10-CM | POA: Insufficient documentation

## 2023-08-16 LAB — ECHOCARDIOGRAM COMPLETE
AR max vel: 1.12 cm2
AV Area VTI: 1.11 cm2
AV Area mean vel: 1.12 cm2
AV Mean grad: 28 mm[Hg]
AV Peak grad: 50.4 mm[Hg]
Ao pk vel: 3.55 m/s
Area-P 1/2: 6.65 cm2
S' Lateral: 2.7 cm

## 2023-08-17 ENCOUNTER — Telehealth: Payer: Self-pay

## 2023-08-17 DIAGNOSIS — I350 Nonrheumatic aortic (valve) stenosis: Secondary | ICD-10-CM

## 2023-08-17 DIAGNOSIS — Z952 Presence of prosthetic heart valve: Secondary | ICD-10-CM

## 2023-08-17 NOTE — Telephone Encounter (Signed)
-----   Message from Charlton Haws sent at 08/17/2023  8:52 AM EDT ----- Gradients a bit high for valve but her EF is hyperdynamic If she feels well would just repeat TTE in a year

## 2023-08-17 NOTE — Telephone Encounter (Signed)
Will place order for echo.

## 2023-09-07 DIAGNOSIS — J3489 Other specified disorders of nose and nasal sinuses: Secondary | ICD-10-CM | POA: Diagnosis not present

## 2023-09-07 DIAGNOSIS — R21 Rash and other nonspecific skin eruption: Secondary | ICD-10-CM | POA: Diagnosis not present

## 2023-09-09 ENCOUNTER — Encounter: Payer: Self-pay | Admitting: Cardiovascular Disease

## 2023-09-26 ENCOUNTER — Other Ambulatory Visit: Payer: Self-pay | Admitting: Cardiovascular Disease

## 2023-09-28 ENCOUNTER — Other Ambulatory Visit: Payer: Self-pay | Admitting: *Deleted

## 2023-09-28 MED ORDER — METOPROLOL SUCCINATE ER 50 MG PO TB24: 50.0000 mg | ORAL_TABLET | Freq: Every day | ORAL | 2 refills | Status: DC

## 2023-10-16 ENCOUNTER — Other Ambulatory Visit: Payer: Self-pay | Admitting: Cardiovascular Disease

## 2023-11-18 ENCOUNTER — Other Ambulatory Visit: Payer: Self-pay

## 2023-11-18 MED ORDER — ATORVASTATIN CALCIUM 40 MG PO TABS: 40.0000 mg | ORAL_TABLET | Freq: Every day | ORAL | 2 refills | Status: DC

## 2023-11-21 MED ORDER — ATORVASTATIN CALCIUM 40 MG PO TABS: 40.0000 mg | ORAL_TABLET | Freq: Every day | ORAL | 0 refills | Status: DC

## 2023-11-21 NOTE — Addendum Note (Signed)
Addended by: Adriana Simas, Aston Lawhorn L on: 11/21/2023 10:56 AM   Modules accepted: Orders

## 2023-12-12 ENCOUNTER — Ambulatory Visit: Payer: Medicare Other | Admitting: Cardiovascular Disease

## 2023-12-16 DIAGNOSIS — J988 Other specified respiratory disorders: Secondary | ICD-10-CM | POA: Diagnosis not present

## 2023-12-16 DIAGNOSIS — B9789 Other viral agents as the cause of diseases classified elsewhere: Secondary | ICD-10-CM | POA: Diagnosis not present

## 2023-12-16 DIAGNOSIS — Z6829 Body mass index (BMI) 29.0-29.9, adult: Secondary | ICD-10-CM | POA: Diagnosis not present

## 2023-12-22 ENCOUNTER — Other Ambulatory Visit: Payer: Self-pay | Admitting: Family Medicine

## 2023-12-22 DIAGNOSIS — Z1231 Encounter for screening mammogram for malignant neoplasm of breast: Secondary | ICD-10-CM

## 2023-12-26 DIAGNOSIS — Z0000 Encounter for general adult medical examination without abnormal findings: Secondary | ICD-10-CM | POA: Diagnosis not present

## 2023-12-26 DIAGNOSIS — Z1389 Encounter for screening for other disorder: Secondary | ICD-10-CM | POA: Diagnosis not present

## 2024-01-13 DIAGNOSIS — H109 Unspecified conjunctivitis: Secondary | ICD-10-CM | POA: Diagnosis not present

## 2024-01-17 DIAGNOSIS — B309 Viral conjunctivitis, unspecified: Secondary | ICD-10-CM | POA: Diagnosis not present

## 2024-01-17 DIAGNOSIS — Z03818 Encounter for observation for suspected exposure to other biological agents ruled out: Secondary | ICD-10-CM | POA: Diagnosis not present

## 2024-01-17 DIAGNOSIS — R0981 Nasal congestion: Secondary | ICD-10-CM | POA: Diagnosis not present

## 2024-02-06 ENCOUNTER — Ambulatory Visit
Admission: RE | Admit: 2024-02-06 | Discharge: 2024-02-06 | Disposition: A | Discharge status: Home / Self Care | Payer: Medicare Other | Source: Ambulatory Visit | Attending: Family Medicine | Admitting: Family Medicine

## 2024-02-06 DIAGNOSIS — Z1231 Encounter for screening mammogram for malignant neoplasm of breast: Secondary | ICD-10-CM

## 2024-02-10 ENCOUNTER — Other Ambulatory Visit: Payer: Self-pay | Admitting: Family Medicine

## 2024-02-10 DIAGNOSIS — R928 Other abnormal and inconclusive findings on diagnostic imaging of breast: Secondary | ICD-10-CM

## 2024-02-21 ENCOUNTER — Ambulatory Visit
Admission: RE | Admit: 2024-02-21 | Discharge: 2024-02-21 | Disposition: A | Discharge status: Home / Self Care | Source: Ambulatory Visit | Attending: Family Medicine | Admitting: Family Medicine

## 2024-02-21 DIAGNOSIS — N6313 Unspecified lump in the right breast, lower outer quadrant: Secondary | ICD-10-CM | POA: Diagnosis not present

## 2024-02-21 DIAGNOSIS — R928 Other abnormal and inconclusive findings on diagnostic imaging of breast: Secondary | ICD-10-CM

## 2024-03-27 NOTE — Progress Notes (Signed)
 Cardiology Office Note    Date:  04/06/2024   ID:  [name], [name] 06-23-1956, MRN [id number]  PCP:  Darnelle Elders, PA-C  Cardiologist: Janelle Mediate, MD / Dr. Arlester Ladd & Dr. Sherene Dilling (TAVR)  CC: Post TAVR   History of Present Illness:  [name] is a 68 y.o. female with a history of HTN, renal cell carcinoma s/p resection, and severe AS s/p TAVR (11/13/19) who presents to clinic for follow up.   TTE 07/2019  marked increase in mean gradient of 77 mmHg and peak gradient of 109 mmHg with a calculated aortic valve area of 0.76 cm. [facility] 08/22/19 showed normal cors and confirmed severe AS.   Underwent successful TAVR with a 23 mm Edwards Sapien 3 THV via the TF approach on 11/13/19. Post operative echo showed EF 65-70%, normally functioning TAVR with mean gradient of 19 mm hg and no PVL. She was discharged on aspirin  and plavix .  TTE 12/19/19 stable mean gradient 20 mmHg in setting of tachycardia   TTE 10/03/20 reviewed EF 65% stable mean gradient 18.5 mmHg no PVL DVI 0.36 AVA 1.3 cm2 TTE 10/08/21 EF 70% mean gradient 19 peak 34 mmHg DVI O.38 AVA 1.1 cm2  TTE 01/27/22 EF 65-70% mean gradient 20 peak 38.9 DVI 0.39 AVA 1.2 cm2 stable  TTE 02/07/23 EF 60-65% mean gradient 22 peak 44 DVI 0.4 AVA 1.2 cm2  TTE 08/16/23 EF 65-70% mild/mod MR mean AV gradient 28 mmHg peak 50 mmHg   No dyspnea. 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 Watched a nice YouTube tribute to her that was on [employer] news  Started back on beta blocker 11/16/21 as HR  elevated Monitor 01/27/22 no significant arrhythmias  Seen in ED 12/24/21 with naausea/vomiting and dizziness BP up MRI with punctate lacunar infarct carotids plaque no stenosis monitor no PAF   She feels fine now Ironically husband had a stroke as well involving vision in his left eye  More LE edema Has varicosities and has gained some weight 06/22/22 norvasc  d/c and  started on Lasix  Gained 40 lbs since stopping job  Seen in ED 06/25/22 for nausea given phenergan  MRI /CT/US  no obvious etiology and d/c     Past Medical History:  Diagnosis Date   Diverticulitis, colon    GERD (gastroesophageal reflux disease)    H/O colonoscopy 07/19/2022   Hypertension    Mild dilation of ascending aorta (HCC)    Pulmonary nodules    seen on pre TAVR CT, needs follow up    Renal cancer (HCC) 2007   Surgically removed from right kidney   S/P TAVR (transcatheter aortic valve replacement)    s/p TAVR w/ a Edwards Sapien 3 Ultra THV via the TF approach on 11/13/19 with Dr. Arlester Ladd and Sherene Dilling.    Severe aortic stenosis    Stroke (cerebrum) (HCC)    a. MRI of brain 12/2021 [facility] showed punctate subacute versus chronic white matter lacunar infarct in the posterior left corona radiata/centrum semiovale.    Past Surgical History:  Procedure Laterality Date   APPENDECTOMY     COLON SURGERY     HERNIA REPAIR     OVARIAN CYST SURGERY     RIGHT/LEFT  HEART CATH AND CORONARY ANGIOGRAPHY N/A 08/22/2019   Procedure: RIGHT/LEFT HEART CATH AND CORONARY ANGIOGRAPHY;  Surgeon: Arty Binning, MD;  Location: [facility] INVASIVE CV LAB;  Service: Cardiovascular;  Laterality: N/A;   TEE WITHOUT CARDIOVERSION N/A 11/13/2019   Procedure: TRANSESOPHAGEAL ECHOCARDIOGRAM (TEE);  Surgeon: Arnoldo Lapping, MD;  Location: [facility] INVASIVE CV LAB;  Service: Open Heart Surgery;  Laterality: N/A;   TRANSCATHETER AORTIC VALVE REPLACEMENT, TRANSFEMORAL N/A 11/13/2019   Procedure: TRANSCATHETER AORTIC VALVE REPLACEMENT, TRANSFEMORAL;  Surgeon: Arnoldo Lapping, MD;  Location: [facility] INVASIVE CV LAB;  Service: Open Heart Surgery;  Laterality: N/A;    Current Medications: Outpatient Medications Prior to Visit  Medication Sig Dispense Refill   aspirin  EC 81 MG tablet Take 1 tablet (81 mg total) by mouth daily. Swallow whole. 90 tablet 3   atorvastatin  (LIPITOR) 40 MG tablet Take 1 tablet (40 mg total) by mouth daily. 90  tablet 0   cetirizine (ZYRTEC) 10 MG tablet Take 10 mg by mouth daily.     fluticasone  (FLONASE ) 50 MCG/ACT nasal spray Place 1 spray into both nostrils as needed for allergies or rhinitis.     ibuprofen (ADVIL) 200 MG tablet Take 200 mg by mouth every 4 (four) hours as needed.     magnesium  oxide (MAG-OX) 400 MG tablet Take 1 tablet (400 mg total) by mouth daily. 30 tablet 11   metoprolol  succinate (TOPROL -XL) 50 MG 24 hr tablet Take 1 tablet (50 mg total) by mouth daily. Take with or immediately following a meal. 90 tablet 2   omeprazole  (PRILOSEC) 40 MG capsule TAKE 1 CAPSULE BY MOUTH DAILY 90 capsule 3   OVER THE COUNTER MEDICATION Take 1 capsule by mouth daily. Tru niagen     OVER THE COUNTER MEDICATION Take 1 tablet by mouth daily at 6 (six) AM. Trans pterostilbene     No facility-administered medications prior to visit.     Allergies:   Diphenhydramine hcl, Protonix  [pantoprazole ], Ciprofloxacin, Codeine, Diphenhydramine, and Morphine and codeine   Social History   Socioeconomic History   Marital status: Married    Spouse name: Not on file   Number of children: Not on file   Years of education: Not on file   Highest education level: Not on file  Occupational History   Not on file  Tobacco Use   Smoking status: Former    Current packs/day: 0.00    Average packs/day: 1 pack/day for 10.0 years (10.0 ttl pk-yrs)    Types: Cigarettes    Start date: 10/26/1995    Quit date: 10/25/2005    Years since quitting: 18.4   Smokeless tobacco: Never   Tobacco comments:    quit around 2007   Vaping Use   Vaping status: Never Used  Substance and Sexual Activity   Alcohol use: Yes    Comment: occ   Drug use: No   Sexual activity: Not on file  Other Topics Concern   Not on file  Social History Narrative   Right handed    Caffeine 1 1/2 cup daily   Live in one story with husband   Social Drivers of Corporate investment banker Strain: Not on file  Food Insecurity: Not on file   Transportation Needs: Not on file  Physical Activity: Not on file  Stress: Not on file  Social Connections: Not on file     Family History:  The patient's family history includes Breast cancer in her mother; Cancer in her father, mother, and sister; Heart  attack in her paternal aunt and paternal grandfather; Hypertension in her father.     ROS:   Please see the history of present illness.    ROS All other systems reviewed and are negative.   PHYSICAL EXAM:   VS:  BP 138/76   Pulse 84   Ht 5' 6 (1.676 m)   Wt 178 lb 12.8 oz (81.1 kg)   SpO2 97%   BMI 28.86 kg/m     Affect appropriate Healthy:  appears stated age HEENT: normal Neck supple with no adenopathy JVP normal no bruits no thyromegaly Lungs clear with no wheezing and good diaphragmatic motion Heart:  S1/S2 SEM through TAVR valve no AR  murmur, no rub, gallop or click PMI normal Abdomen: benighn, BS positve, no tenderness, no AAA no bruit.  No HSM or HJR Distal pulses intact with no bruits Plus one bilateral edema with varicosities  Neuro non-focal Skin warm and dry No muscular weakness    Wt Readings from Last 3 Encounters:  04/06/24 178 lb 12.8 oz (81.1 kg)  05/30/23 182 lb (82.6 kg)  08/03/22 167 lb (75.8 kg)      Studies/Labs Reviewed:   EKG:  SR rate 84 bpm  LAD otherwise normal 10/30/20  Recent Labs: No results found for requested labs within last 365 days.   Lipid Panel    Component Value Date/Time   CHOL 126 11/09/2022 1017   TRIG 88 11/09/2022 1017   HDL 38 (L) 11/09/2022 1017   CHOLHDL 3.3 11/09/2022 1017   LDLCALC 71 11/09/2022 1017    Additional studies/ records that were reviewed today include:  TAVR OPERATIVE NOTE     Date of Procedure:                11/13/2019   Preoperative Diagnosis:      Severe Aortic Stenosis    Postoperative Diagnosis:    Same    Procedure:        Transcatheter Aortic Valve Replacement - Percutaneous Right Transfemoral Approach             Edwards  Sapien 3 Ultra THV (size 23 mm, model # 9750TFX, serial # [id number])              Co-Surgeons:                        Bartley Lightning, MD and Arnoldo Lapping, MD     Anesthesiologist:                  Arlyne Lame, MD   Echocardiographer:              Christoper Crafts, MD   Pre-operative Echo Findings: Severe aortic stenosis Normal left ventricular systolic function   Post-operative Echo Findings: No paravalvular leak Normal left ventricular systolic function   _____________     Echo  . 08/16/23    AORTIC VALVE  AV Area (Vmax):    1.12 cm  AV Area (Vmean):   1.12 cm  AV Area (VTI):     1.11 cm  AV Vmax:           355.00 cm/s  AV Vmean:          246.000 cm/s  AV VTI:            0.702 m  AV Peak Grad:      50.4 mmHg  AV Mean Grad:      28.0 mmHg  LVOT  Vmax:         126.00 cm/s  LVOT Vmean:        87.500 cm/s  LVOT VTI:          0.249 m  LVOT/AV VTI ratio: 0.35   ASSESSMENT & PLAN:   Severe AS s/p TAVR: 11/13/19 23 mm Sapien 3 valve gradients have been high and rising. Will check TAVR f/u CTA to look for HALT/HAM   HTN: Lasix  started norvasc  d/c due to edema much improved   GERD: back on prolosec discussed low carb diet and meal portions    Pulmonary nodules: these are followed closely by Dr. Verona Goodwill. He recommended another CT scan in 2 years.   CVA:  lacunar ? Related to small vessel dx continue ASA, statin and good BP control F/U Dr Festus Hubert neurology   BMET TAVR CTA Take her Toprol  2 hours before study  F/U in  6 months      Signed, Janelle Mediate, MD  04/06/2024 10:31 AM    [facility] [address] [place], [place], [place]  [address] Phone: [phone]; Fax: [phone]

## 2024-04-06 ENCOUNTER — Ambulatory Visit: Payer: Medicare Other | Attending: Cardiovascular Disease | Admitting: Cardiovascular Disease

## 2024-04-06 ENCOUNTER — Other Ambulatory Visit: Payer: Self-pay

## 2024-04-06 VITALS — BP 138/76 | HR 84 | Ht 66.0 in | Wt 178.8 lb

## 2024-04-06 DIAGNOSIS — Z952 Presence of prosthetic heart valve: Secondary | ICD-10-CM | POA: Diagnosis not present

## 2024-04-06 DIAGNOSIS — I2510 Atherosclerotic heart disease of native coronary artery without angina pectoris: Secondary | ICD-10-CM | POA: Diagnosis not present

## 2024-04-06 DIAGNOSIS — I10 Essential (primary) hypertension: Secondary | ICD-10-CM

## 2024-04-06 MED ORDER — METOPROLOL TARTRATE 100 MG PO TABS: 100.0000 mg | ORAL_TABLET | Freq: Once | ORAL | 0 refills | Status: DC

## 2024-04-06 NOTE — Patient Instructions (Addendum)
 Medication Instructions:  Your physician recommends that you continue on your current medications as directed. Please refer to the Current Medication list given to you today.  *If you need a refill on your cardiac medications before your next appointment, please call your pharmacy*  Lab Work: Your physician recommends that you have a BMET  If you have labs (blood work) drawn today and your tests are completely normal, you will receive your results only by: MyChart Message (if you have MyChart) OR A paper copy in the mail If you have any lab test that is abnormal or we need to change your treatment, we will call you to review the results.  Testing/Procedures: Your physician has requested that you have cardiac CT. Cardiac computed tomography (CT) is a painless test that uses an x-ray machine to take clear, detailed pictures of your heart. For further information please visit [email or web address] Please follow instruction sheet as given.  Follow-Up: At [facility], you and your health needs are our priority.  As part of our continuing mission to provide you with exceptional heart care, our providers are all part of one team.  This team includes your primary Cardiologist (physician) and Advanced Practice Providers or APPs (Physician Assistants and Nurse Practitioners) who all work together to provide you with the care you need, when you need it.  Your next appointment:   6 month(s)  Provider:   Janelle Mediate, MD    We recommend signing up for the patient portal called MyChart.  Sign up information is provided on this After Visit Summary.  MyChart is used to connect with patients for Virtual Visits (Telemedicine).  Patients are able to view lab/test results, encounter notes, upcoming appointments, etc.  Non-urgent messages can be sent to your provider as well.   To learn more about what you can do with MyChart, go to [email or web address].   Other Instructions      Your cardiac  CT will be scheduled at one of the below locations:   [facility] [address] [place], [place] [address] [phone]  OR  [facility] [address] [place], [place] [address] [phone]  OR   [facility] [address] [place], [place] [address] [phone]  OR   [facility] [address] [place], [place] [address] [phone]  OR   [facility] and Vascular Tower [address]  [place], [place] [address] Opening February 20, 2024  If scheduled at [facility], please arrive at the [facility] and Children's Entrance (Entrance C2) of [facility] 30 minutes prior to test start time. You can use the FREE valet parking offered at entrance C (encouraged to control the heart rate for the test)  Proceed to the [facility] Radiology Department (first floor) to check-in and test prep.   All radiology patients and guests should use entrance C2 at [facility], accessed from [facility], even though the hospital's physical address listed is [address]    If scheduled at the Heart and Vascular Tower at [address], please enter the parking lot using the [address] entrance and use the FREE valet service at the patient drop-off area. Enter the buidling and check-in with registration on the main floor.  If scheduled at [facility] or [facility], please arrive 15 mins early for check-in and test prep.  There is spacious parking and easy  access to the radiology department from the [facility] Heart and Vascular entrance. Please enter here and check-in with the desk attendant.   If scheduled at [facility], please arrive 30 minutes early for check-in and test prep.  Please follow these instructions carefully (unless otherwise directed):  On the Night Before the  Test: Be sure to Drink plenty of water. Do not consume any caffeinated/decaffeinated beverages or chocolate 12 hours prior to your test. Do not take any antihistamines 12 hours prior to your test.   On the Day of the Test: Drink plenty of water until 1 hour prior to the test. Do not eat any food 1 hour prior to test. You may take your regular medications prior to the test.  Take metoprolol  (Lopressor )100 mg two hours prior to test. FEMALES- please wear underwire-free bra if available, avoid dresses & tight clothing      After the Test: Drink plenty of water. After receiving IV contrast, you may experience a mild flushed feeling. This is normal. On occasion, you may experience a mild rash up to 24 hours after the test. This is not dangerous. If this occurs, you can take Benadryl 25 mg, Zyrtec, Claritin, or Allegra and increase your fluid intake. (Patients taking Tikosyn should avoid Benadryl, and may take Zyrtec, Claritin, or Allegra) If you experience trouble breathing, this can be serious. If it is severe call 911 IMMEDIATELY. If it is mild, please call our office.  We will call to schedule your test 2-4 weeks out understanding that some insurance companies will need an authorization prior to the service being performed.   For more information and frequently asked questions, please visit our website : [email or web address]  For non-scheduling related questions, please contact the cardiac imaging nurse navigator should you have any questions/concerns: Cardiac Imaging Nurse Navigators Direct Office Dial: [phone]   For scheduling needs, including cancellations and rescheduling, please call [place], [phone].

## 2024-04-06 NOTE — Addendum Note (Signed)
 Addended by: Reyes Caul, Kalie Cabral L on: 04/06/2024 10:43 AM   Modules accepted: Orders

## 2024-04-07 LAB — BASIC METABOLIC PANEL WITH GFR
BUN/Creatinine Ratio: 21 (ref 12–28)
BUN: 14 mg/dL (ref 8–27)
CO2: 24 mmol/L (ref 20–29)
Calcium: 10.1 mg/dL (ref 8.7–10.3)
Chloride: 101 mmol/L (ref 96–106)
Creatinine, Ser: 0.66 mg/dL (ref 0.57–1.00)
Glucose: 95 mg/dL (ref 70–99)
Potassium: 4.9 mmol/L (ref 3.5–5.2)
Sodium: 141 mmol/L (ref 134–144)
eGFR: 94 mL/min/{1.73_m2} (ref >59)

## 2024-04-13 ENCOUNTER — Telehealth (HOSPITAL_COMMUNITY): Payer: Self-pay | Admitting: *Deleted

## 2024-04-13 ENCOUNTER — Encounter: Payer: Self-pay | Admitting: Cardiovascular Disease

## 2024-04-13 NOTE — Telephone Encounter (Signed)
 Reaching out to patient to offer assistance regarding upcoming cardiac imaging study; pt verbalizes understanding of appt date/time, parking situation and where to check in, pre-test NPO status and medications ordered, and verified current allergies; name and call back number provided for further questions should they arise Johney Frame RN Navigator Cardiac Imaging [facility] [phone] office [phone] cell

## 2024-04-13 NOTE — Telephone Encounter (Signed)
 Called patient back about message. Informed patient that she could take the Lopressor  since she already picked up metoprolol  100 mg tablet. Will make sure Dr. Stann Earnest is okay with this.

## 2024-04-16 ENCOUNTER — Ambulatory Visit (HOSPITAL_COMMUNITY)
Admission: RE | Admit: 2024-04-16 | Discharge: 2024-04-16 | Disposition: A | Discharge status: Home / Self Care | Source: Ambulatory Visit | Attending: Cardiovascular Disease | Admitting: Cardiovascular Disease

## 2024-04-16 DIAGNOSIS — I10 Essential (primary) hypertension: Secondary | ICD-10-CM | POA: Diagnosis not present

## 2024-04-16 DIAGNOSIS — I2510 Atherosclerotic heart disease of native coronary artery without angina pectoris: Secondary | ICD-10-CM

## 2024-04-16 DIAGNOSIS — Z952 Presence of prosthetic heart valve: Secondary | ICD-10-CM | POA: Diagnosis not present

## 2024-04-16 MED ORDER — IOHEXOL 350 MG/ML SOLN
100.0000 mL | Freq: Once | INTRAVENOUS | Status: AC | PRN
  Administered 2024-04-16: 100 mL via INTRAVENOUS

## 2024-04-19 ENCOUNTER — Ambulatory Visit: Payer: Self-pay | Admitting: Cardiovascular Disease

## 2024-04-23 ENCOUNTER — Encounter: Payer: Self-pay | Admitting: Cardiovascular Disease

## 2024-04-23 NOTE — Telephone Encounter (Signed)
 Tiffany Maude BROCKS, MD to Me (Selected Message)   04/19/24 10:18 PM Result Note Not much thrombus on leaflets will f/u echo since she is asymptomatic and only start anticoagulation if gradients go up further    Here is CT results.

## 2024-05-24 ENCOUNTER — Other Ambulatory Visit: Payer: Self-pay | Admitting: Cardiovascular Disease

## 2024-05-24 DIAGNOSIS — Z952 Presence of prosthetic heart valve: Secondary | ICD-10-CM

## 2024-05-24 DIAGNOSIS — I10 Essential (primary) hypertension: Secondary | ICD-10-CM

## 2024-05-24 DIAGNOSIS — I2510 Atherosclerotic heart disease of native coronary artery without angina pectoris: Secondary | ICD-10-CM

## 2024-05-29 DIAGNOSIS — I350 Nonrheumatic aortic (valve) stenosis: Secondary | ICD-10-CM | POA: Diagnosis not present

## 2024-05-29 DIAGNOSIS — I10 Essential (primary) hypertension: Secondary | ICD-10-CM | POA: Diagnosis not present

## 2024-05-29 DIAGNOSIS — E785 Hyperlipidemia, unspecified: Secondary | ICD-10-CM | POA: Diagnosis not present

## 2024-05-29 DIAGNOSIS — K219 Gastro-esophageal reflux disease without esophagitis: Secondary | ICD-10-CM | POA: Diagnosis not present

## 2024-05-29 DIAGNOSIS — Z1159 Encounter for screening for other viral diseases: Secondary | ICD-10-CM | POA: Diagnosis not present

## 2024-06-20 ENCOUNTER — Encounter: Payer: Self-pay | Admitting: Emergency Medicine

## 2024-06-20 ENCOUNTER — Ambulatory Visit (INDEPENDENT_AMBULATORY_CARE_PROVIDER_SITE_OTHER): Admitting: Emergency Medicine

## 2024-06-20 VITALS — BP 111/73 | HR 91 | Ht 66.0 in | Wt 177.4 lb

## 2024-06-20 DIAGNOSIS — R918 Other nonspecific abnormal finding of lung field: Secondary | ICD-10-CM

## 2024-06-20 NOTE — Assessment & Plan Note (Signed)
 Multiple small pulmonary nodules all 4 mm or less, likely benign.  They were followed for 3 years.  There was a left lower lobe superior segment groundglass nodule that needs a repeat scan to ensure interval stability. Will arrange for this and then follow to review

## 2024-06-20 NOTE — Patient Instructions (Signed)
 It is good to see you today.  I glad that you are feeling well. We will form a repeat CT scan of your chest to compare with your priors. Please follow Dr. Shelah next available after the CT so we can review the results together.

## 2024-06-20 NOTE — Progress Notes (Signed)
   Subjective:    Patient ID: [name], female    DOB: 09/30/1956, 68 y.o.   MRN: [id number]  HPI  ROV 06/20/2024 --68 year old woman whom I seen in the past for history of former tobacco use and pulmonary nodular disease.  She has a history of hypertension, GERD, prior renal cell carcinoma with a right nephrectomy, chronic renal insufficiency.  Her last CT chest was stable back in 2020, but there was a LLL ground glass nodule that was stable for 3 yrs, needs follow up.    Review of Systems  Constitutional:  Negative for fever and unexpected weight change.  HENT:  Negative for congestion, dental problem, ear pain, nosebleeds, postnasal drip, rhinorrhea, sinus pressure, sneezing, sore throat and trouble swallowing.   Eyes:  Negative for redness and itching.  Respiratory:  Negative for cough, chest tightness, shortness of breath and wheezing.   Cardiovascular:  Negative for palpitations and leg swelling.  Gastrointestinal:  Negative for nausea and vomiting.  Genitourinary:  Negative for dysuria.  Musculoskeletal:  Negative for joint swelling.  Skin:  Negative for rash.  Neurological:  Negative for headaches.  Hematological:  Does not bruise/bleed easily.  Psychiatric/Behavioral:  Negative for dysphoric mood. The patient is not nervous/anxious.          Objective:   Physical Exam Vitals:   06/20/24 1436  BP: 111/73  Pulse: 91  SpO2: 96%  Weight: 177 lb 6.4 oz (80.5 kg)  Height: 5' 6 (1.676 m)   Gen: Pleasant, well-nourished, in no distress,  normal affect  ENT: No lesions,  mouth clear,  oropharynx clear, no postnasal drip  Neck: No JVD, no stridor  Lungs: No use of accessory muscles, clear without rales or rhonchi  Cardiovascular: RRR, heart sounds normal, no murmur or gallops, no peripheral edema  Musculoskeletal: No deformities, no cyanosis or clubbing  Neuro: alert, non focal  Skin: Warm, no lesions or rashes     Assessment & Plan:  Pulmonary  nodules/lesions, multiple Multiple small pulmonary nodules all 4 mm or less, likely benign.  They were followed for 3 years.  There was a left lower lobe superior segment groundglass nodule that needs a repeat scan to ensure interval stability. Will arrange for this and then follow to review  Lamar Chris, MD, PhD 06/20/2024, 3:08 PM [facility] [phone] or if no answer [phone]

## 2024-06-21 ENCOUNTER — Encounter: Payer: Self-pay | Admitting: Emergency Medicine

## 2024-06-22 ENCOUNTER — Ambulatory Visit
Admission: RE | Admit: 2024-06-22 | Discharge: 2024-06-22 | Disposition: A | Discharge status: Home / Self Care | Source: Ambulatory Visit | Attending: Emergency Medicine | Admitting: Emergency Medicine

## 2024-06-22 DIAGNOSIS — R918 Other nonspecific abnormal finding of lung field: Secondary | ICD-10-CM

## 2024-06-22 DIAGNOSIS — R911 Solitary pulmonary nodule: Secondary | ICD-10-CM | POA: Diagnosis not present

## 2024-06-24 ENCOUNTER — Other Ambulatory Visit: Payer: Self-pay | Admitting: Cardiovascular Disease

## 2024-06-26 ENCOUNTER — Other Ambulatory Visit: Payer: Self-pay

## 2024-06-26 DIAGNOSIS — R0981 Nasal congestion: Secondary | ICD-10-CM | POA: Diagnosis not present

## 2024-06-26 DIAGNOSIS — Z6829 Body mass index (BMI) 29.0-29.9, adult: Secondary | ICD-10-CM | POA: Diagnosis not present

## 2024-07-16 ENCOUNTER — Ambulatory Visit

## 2024-08-09 ENCOUNTER — Encounter: Payer: Self-pay | Admitting: Emergency Medicine

## 2024-08-09 ENCOUNTER — Telehealth: Payer: Self-pay | Admitting: Emergency Medicine

## 2024-08-09 ENCOUNTER — Ambulatory Visit (INDEPENDENT_AMBULATORY_CARE_PROVIDER_SITE_OTHER): Admitting: Emergency Medicine

## 2024-08-09 VITALS — BP 121/82 | HR 94 | Temp 98.0°F | Ht 66.0 in | Wt 177.0 lb

## 2024-08-09 DIAGNOSIS — R918 Other nonspecific abnormal finding of lung field: Secondary | ICD-10-CM | POA: Diagnosis not present

## 2024-08-09 NOTE — Assessment & Plan Note (Signed)
 I reviewed her CT scans of the chest with her going back to 2018 and up to the present.  Showed her the imaging and explained that there is some increase suspicion that 1 or more of the lesions could represent well-differentiated adenocarcinoma.  Explained the pros and cons, risks, benefits of bronchoscopy versus watchful waiting.  She has elected to proceed with bronchoscopy.  I will work on getting this set up soon, probably 08/21/2024.  We reviewed your CT scans of the chest today.  There has been some slight enlargement of a left lower lobe pulmonary nodule.  There is a new smaller nodule in the right upper lobe. We will arrange for navigational bronchoscopy which will be done as an outpatient under general anesthesia at [facility].  You will need a designated driver and someone to watch you at home that evening after the procedure.  You will need to stop your aspirin  for 2 days prior.  We will try to get this scheduled for 08/21/2024. We will arrange for follow-up to review your results the week following your procedure.

## 2024-08-09 NOTE — Telephone Encounter (Signed)
 Letter given by the nurse case # [id number] will send to [facility] to check auth

## 2024-08-09 NOTE — Patient Instructions (Signed)
 We reviewed your CT scans of the chest today.  There has been some slight enlargement of a left lower lobe pulmonary nodule.  There is a new smaller nodule in the right upper lobe. We will arrange for navigational bronchoscopy which will be done as an outpatient under general anesthesia at [facility].  You will need a designated driver and someone to watch you at home that evening after the procedure.  You will need to stop your aspirin  for 2 days prior.  We will try to get this scheduled for 08/21/2024. We will arrange for follow-up to review your results the week following your procedure.

## 2024-08-09 NOTE — Telephone Encounter (Signed)
 Please schedule the following:  Provider performing procedure: Delaine Hernandez Diagnosis: Bilateral pulmonary nodules Which side if for nodule / mass?  Bilateral Procedure: Robotic assisted navigational bronchoscopy Has patient been spoken to by Provider and given informed consent?  Yes Anesthesia: General Do you need Fluro?  Yes Duration of procedure: 60 minutes Date: 08/21/2024 Alternate Date: Any Time: Any Location: [facility] Does patient have OSA?  No DM?  No or Latex allergy?  No Medication Restriction/ Anticoagulate/Antiplatelet: Stop aspirin  2 days prior Pre-op Labs Ordered:determined by Anesthesia Imaging request: CT chest available in PACS (If, SuperDimension CT Chest, please have STAT courier sent to ENDO)

## 2024-08-09 NOTE — H&P (View-Only) (Signed)
 Subjective:    Patient ID: [name], female    DOB: February 05, 1956, 68 y.o.   MRN: [id number]  HPI  ROV 06/20/2024 --68 year old woman whom I seen in the past for history of former tobacco use and pulmonary nodular disease.  She has a history of hypertension, GERD, prior renal cell carcinoma with a right nephrectomy, chronic renal insufficiency.  Her last CT chest was stable back in 2020, but there was a LLL ground glass nodule that was stable for 3 yrs, needs follow up.   ROV 08/09/2024 --follow-up visit for 68 year old woman with history of tobacco use.  I seen her in the past for pulmonary nodules on CT scan of the chest.  PMH also significant for hypertension, GERD, renal cell carcinoma with a right nephrectomy and chronic renal insufficiency.  Her serial CT scans had been stable up to 2020 but there was a left lower lobe ground glass pulmonary nodule that had been stable for 3 years and needed completion of its follow-up.  She underwent a repeat CT chest 06/22/2024 as below.  CT scan of the chest 06/22/2024 reviewed by me, shows multiple bilateral pulmonary nodules some solid and some ground glass.  There is a 1.6 x 1.2 cm mixed density nodule in the superior segment of the left lower lobe that is slightly larger and more conspicuous compared with 2018.  The solid component is 6 mm.  There is also a new ground glass nodule in the inferior right upper lobe 10 x 8 mm.   Review of Systems  Constitutional:  Negative for fever and unexpected weight change.  HENT:  Negative for congestion, dental problem, ear pain, nosebleeds, postnasal drip, rhinorrhea, sinus pressure, sneezing, sore throat and trouble swallowing.   Eyes:  Negative for redness and itching.  Respiratory:  Negative for cough, chest tightness, shortness of breath and wheezing.   Cardiovascular:  Negative for palpitations and leg swelling.  Gastrointestinal:  Negative for nausea and vomiting.  Genitourinary:  Negative for dysuria.   Musculoskeletal:  Negative for joint swelling.  Skin:  Negative for rash.  Neurological:  Negative for headaches.  Hematological:  Does not bruise/bleed easily.  Psychiatric/Behavioral:  Negative for dysphoric mood. The patient is not nervous/anxious.          Objective:   Physical Exam Vitals:   08/09/24 1126  BP: 121/82  Pulse: 94  Temp: 98 F (36.7 C)  TempSrc: Oral  SpO2: 98%  Weight: 177 lb (80.3 kg)  Height: 5' 6 (1.676 m)   Gen: Pleasant, well-nourished, in no distress,  normal affect  ENT: No lesions,  mouth clear,  oropharynx clear, no postnasal drip  Neck: No JVD, no stridor  Lungs: No use of accessory muscles, clear without rales or rhonchi  Cardiovascular: RRR, heart sounds normal, no murmur or gallops, no peripheral edema  Musculoskeletal: No deformities, no cyanosis or clubbing  Neuro: alert, non focal  Skin: Warm, no lesions or rashes     Assessment & Plan:  Pulmonary nodules/lesions, multiple I reviewed her CT scans of the chest with her going back to 2018 and up to the present.  Showed her the imaging and explained that there is some increase suspicion that 1 or more of the lesions could represent well-differentiated adenocarcinoma.  Explained the pros and cons, risks, benefits of bronchoscopy versus watchful waiting.  She has elected to proceed with bronchoscopy.  I will work on getting this set up soon, probably 08/21/2024.  We reviewed your CT scans  of the chest today.  There has been some slight enlargement of a left lower lobe pulmonary nodule.  There is a new smaller nodule in the right upper lobe. We will arrange for navigational bronchoscopy which will be done as an outpatient under general anesthesia at [facility].  You will need a designated driver and someone to watch you at home that evening after the procedure.  You will need to stop your aspirin  for 2 days prior.  We will try to get this scheduled for 08/21/2024. We will  arrange for follow-up to review your results the week following your procedure.  Time spent 50 minutes reviewing CT scans of the chest, differential diagnosis, possible strategies for either follow-up or diagnosis, pros and cons of bronchoscopy.  Lamar Chris, MD, PhD 08/09/2024, 12:52 PM [facility] [phone] or if no answer [phone]

## 2024-08-09 NOTE — Progress Notes (Signed)
 Subjective:    Patient ID: [name], female    DOB: February 05, 1956, 68 y.o.   MRN: [id number]  HPI  ROV 06/20/2024 --68 year old woman whom I seen in the past for history of former tobacco use and pulmonary nodular disease.  She has a history of hypertension, GERD, prior renal cell carcinoma with a right nephrectomy, chronic renal insufficiency.  Her last CT chest was stable back in 2020, but there was a LLL ground glass nodule that was stable for 3 yrs, needs follow up.   ROV 08/09/2024 --follow-up visit for 68 year old woman with history of tobacco use.  I seen her in the past for pulmonary nodules on CT scan of the chest.  PMH also significant for hypertension, GERD, renal cell carcinoma with a right nephrectomy and chronic renal insufficiency.  Her serial CT scans had been stable up to 2020 but there was a left lower lobe ground glass pulmonary nodule that had been stable for 3 years and needed completion of its follow-up.  She underwent a repeat CT chest 06/22/2024 as below.  CT scan of the chest 06/22/2024 reviewed by me, shows multiple bilateral pulmonary nodules some solid and some ground glass.  There is a 1.6 x 1.2 cm mixed density nodule in the superior segment of the left lower lobe that is slightly larger and more conspicuous compared with 2018.  The solid component is 6 mm.  There is also a new ground glass nodule in the inferior right upper lobe 10 x 8 mm.   Review of Systems  Constitutional:  Negative for fever and unexpected weight change.  HENT:  Negative for congestion, dental problem, ear pain, nosebleeds, postnasal drip, rhinorrhea, sinus pressure, sneezing, sore throat and trouble swallowing.   Eyes:  Negative for redness and itching.  Respiratory:  Negative for cough, chest tightness, shortness of breath and wheezing.   Cardiovascular:  Negative for palpitations and leg swelling.  Gastrointestinal:  Negative for nausea and vomiting.  Genitourinary:  Negative for dysuria.   Musculoskeletal:  Negative for joint swelling.  Skin:  Negative for rash.  Neurological:  Negative for headaches.  Hematological:  Does not bruise/bleed easily.  Psychiatric/Behavioral:  Negative for dysphoric mood. The patient is not nervous/anxious.          Objective:   Physical Exam Vitals:   08/09/24 1126  BP: 121/82  Pulse: 94  Temp: 98 F (36.7 C)  TempSrc: Oral  SpO2: 98%  Weight: 177 lb (80.3 kg)  Height: 5' 6 (1.676 m)   Gen: Pleasant, well-nourished, in no distress,  normal affect  ENT: No lesions,  mouth clear,  oropharynx clear, no postnasal drip  Neck: No JVD, no stridor  Lungs: No use of accessory muscles, clear without rales or rhonchi  Cardiovascular: RRR, heart sounds normal, no murmur or gallops, no peripheral edema  Musculoskeletal: No deformities, no cyanosis or clubbing  Neuro: alert, non focal  Skin: Warm, no lesions or rashes     Assessment & Plan:  Pulmonary nodules/lesions, multiple I reviewed her CT scans of the chest with her going back to 2018 and up to the present.  Showed her the imaging and explained that there is some increase suspicion that 1 or more of the lesions could represent well-differentiated adenocarcinoma.  Explained the pros and cons, risks, benefits of bronchoscopy versus watchful waiting.  She has elected to proceed with bronchoscopy.  I will work on getting this set up soon, probably 08/21/2024.  We reviewed your CT scans  of the chest today.  There has been some slight enlargement of a left lower lobe pulmonary nodule.  There is a new smaller nodule in the right upper lobe. We will arrange for navigational bronchoscopy which will be done as an outpatient under general anesthesia at [facility].  You will need a designated driver and someone to watch you at home that evening after the procedure.  You will need to stop your aspirin  for 2 days prior.  We will try to get this scheduled for 08/21/2024. We will  arrange for follow-up to review your results the week following your procedure.  Time spent 50 minutes reviewing CT scans of the chest, differential diagnosis, possible strategies for either follow-up or diagnosis, pros and cons of bronchoscopy.  Lamar Chris, MD, PhD 08/09/2024, 12:52 PM [facility] [phone] or if no answer [phone]

## 2024-08-16 ENCOUNTER — Ambulatory Visit (HOSPITAL_COMMUNITY)
Admission: RE | Admit: 2024-08-16 | Discharge: 2024-08-16 | Disposition: A | Discharge status: Home / Self Care | Source: Ambulatory Visit | Attending: Internal Medicine | Admitting: Internal Medicine

## 2024-08-16 DIAGNOSIS — Z952 Presence of prosthetic heart valve: Secondary | ICD-10-CM | POA: Insufficient documentation

## 2024-08-16 DIAGNOSIS — I350 Nonrheumatic aortic (valve) stenosis: Secondary | ICD-10-CM | POA: Diagnosis not present

## 2024-08-16 LAB — ECHOCARDIOGRAM COMPLETE
AR max vel: 2.32 cm2
AV Area VTI: 2.11 cm2
AV Area mean vel: 2.28 cm2
AV Mean grad: 22 mmHg
AV Peak grad: 34.3 mmHg
Ao pk vel: 2.93 m/s
Area-P 1/2: 4.39 cm2
S' Lateral: 2.25 cm

## 2024-08-17 ENCOUNTER — Ambulatory Visit: Payer: Self-pay | Admitting: Cardiovascular Disease

## 2024-08-17 ENCOUNTER — Other Ambulatory Visit: Payer: Self-pay | Admitting: Cardiovascular Disease

## 2024-08-20 ENCOUNTER — Encounter (HOSPITAL_COMMUNITY): Payer: Self-pay | Admitting: Emergency Medicine

## 2024-08-20 ENCOUNTER — Other Ambulatory Visit: Payer: Self-pay

## 2024-08-20 NOTE — Progress Notes (Signed)
 Anesthesia Chart Review: Same day workup  68 year old female follows with cardiology for history of HTN, severe AS s/p TAVR 10/2019.  Last seen by Dr. Hans on 04/06/2024.  Per note, bioprosthetic AVR gradients have been rising; mean gradient 28 mmHg by echo 07/2023.  CTA was ordered.  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 This does not correlate well with a prosthetic valve gradient of 28 mm Hg. Differential includes patient prosthesis mismatch or hyperdynamic function.  Dr. Delford commented on results stating, Not much thrombus on leaflets will f/u echo since she is asymptomatic and only start anticoagulation if gradients go up further.  Follows with pulmonology for history of former smoker and pulmonary nodules.  Last seen by Dr. Shelah 08/09/2024.  Per note, recent imaging showed multiple bilateral pulmonary nodules as well as a 1.6 x 1.2 cm mixed density nodule in the superior segment of the left lower lobe that is slightly larger/more conspicuous compared to 2018.  She was recommended to undergo bronchoscopy with biopsy.  Other pertinent history includes GERD on PPI, CVA (punctate infarct, likely secondary to small vessel disease, incidental finding), RCC s/p right nephrectomy.  Patient will need day of surgery labs and evaluation.  EKG 05/30/2023: NSR.  Rate 72.  Coronary CTA 04/16/2024: IMPRESSION: 1. 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 This does not correlate well with a prosthetic valve gradient of 28 mm Hg. Differential includes patient prosthesis mismatch or hyperdynamic function.  TTE 08/16/2023: 1. Left ventricular ejection fraction, by estimation, is 65 to 70%. Left  ventricular ejection fraction by 3D volume is 68 %. The left ventricle has  normal function. The left ventricle has no regional wall motion  abnormalities. There is moderate  asymmetric left ventricular hypertrophy of the basal-septal segment. Left   ventricular diastolic parameters are consistent with Grade I diastolic  dysfunction (impaired relaxation).   2. Right ventricular systolic function is normal. The right ventricular  size is normal. There is normal pulmonary artery systolic pressure. The  estimated right ventricular systolic pressure is 26.0 mmHg.   3. The mitral valve is normal in structure. Mild to moderate mitral valve  regurgitation. No evidence of mitral stenosis.   4. The aortic valve has been repaired/replaced. Aortic valve  regurgitation is trivial. Aortic valve mean gradient measures 28.0 mmHg.  Aortic valve Vmax measures 3.55 m/s. Aortic valve acceleration time  measures 90 msec.   Comparison(s): Graidents have increased from prior. RSB Pedoff not done in  prior, only SSN; this may not reflect increase in gradients from prior.   Conclusion(s)/Recommendation(s): Given higher gradients that seen on this  study, consider additional imaging vs interval repeat in testing with RSB  Pedoff probe.     Lynwood Geofm RIGGERS [facility] Short Stay Center/Anesthesiology Phone [phone] 08/20/2024 12:54 PM

## 2024-08-20 NOTE — Anesthesia Preprocedure Evaluation (Signed)
 Anesthesia Evaluation  Patient identified by MRN, date of birth, ID band Patient awake    Reviewed: Allergy & Precautions, NPO status , Patient's Chart, lab work & pertinent test results, reviewed documented beta blocker date and time   History of Anesthesia Complications Negative for: history of anesthetic complications  Airway Mallampati: II  TM Distance: >3 FB Neck ROM: Full    Dental  (+) Edentulous Upper, Edentulous Lower   Pulmonary former smoker   breath sounds clear to auscultation       Cardiovascular hypertension, Pt. on medications and Pt. on home beta blockers (-) angina + Valvular Problems/Murmurs (s/p TAVR)  Rhythm:Regular Rate:Normal + Systolic murmurs 89/76/7974 ECHO: EF 65 to 70%.  1. The LV has normal function, no regional wall motion abnormalities. There is mild LVH.  The global longitudinal strain is normal.   2. RVF is normal. The right ventricular size is normal.   3. The mitral valve is normal in structure. Trivial mitral valve regurgitation.   4. S/p TAVR (23 mm Sapien prosthesis; procedure date 11/13/2019) Peak nad  mena gradients through the valve are 34 and 22 mm Hg respectively  Dimensionless valve index is 0.4 OVerall relatively unchanged from echo  done in October 2024. The aortic valve has been repaired/replaced. Aortic valve regurgitation is not visualized. There is a 23 mm Sapien prosthetic (TAVR) valve present in the aortic position. Procedure Date: 1/19     Neuro/Psych CVA, No Residual Symptoms    GI/Hepatic Neg liver ROS,GERD  Medicated and Controlled,,  Endo/Other  negative endocrine ROS    Renal/GU H/o renal cancer     Musculoskeletal   Abdominal   Peds  Hematology Hb 12.5, plt 265k   Anesthesia Other Findings   Reproductive/Obstetrics                              Anesthesia Physical Anesthesia Plan  ASA: 3  Anesthesia Plan: General   Post-op Pain  Management: Tylenol  PO (pre-op)*   Induction: Intravenous  PONV Risk Score and Plan: 3 and Ondansetron , Dexamethasone and Treatment may vary due to age or medical condition  Airway Management Planned: Oral ETT  Additional Equipment: None  Intra-op Plan:   Post-operative Plan: Extubation in OR  Informed Consent: I have reviewed the patients History and Physical, chart, labs and discussed the procedure including the risks, benefits and alternatives for the proposed anesthesia with the patient or authorized representative who has indicated his/her understanding and acceptance.       Plan Discussed with: CRNA and Surgeon  Anesthesia Plan Comments: (PAT note by Lynwood Hope, PA-C: 68 year old female follows with cardiology for history of HTN, severe AS s/p TAVR 10/2019.  Last seen by Dr. Hans on 04/06/2024.  Per note, bioprosthetic AVR gradients have been rising; mean gradient 28 mmHg by echo 07/2023.  CTA was ordered.  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 This does not correlate well with a prosthetic valve gradient of 28 mm Hg. Differential includes patient prosthesis mismatch or hyperdynamic function.  Dr. Delford commented on results stating, Not much thrombus on leaflets will f/u echo since she is asymptomatic and only start anticoagulation if gradients go up further.  Follows with pulmonology for history of former smoker and pulmonary nodules.  Last seen by Dr. Shelah 08/09/2024.  Per note, recent imaging showed multiple bilateral pulmonary nodules as well as a 1.6 x 1.2 cm mixed  density nodule in the superior segment of the left lower lobe that is slightly larger/more conspicuous compared to 2018.  She was recommended to undergo bronchoscopy with biopsy.  Other pertinent history includes GERD on PPI, CVA (punctate infarct, likely secondary to small vessel disease, incidental finding), RCC s/p right nephrectomy.  Patient will need day of  surgery labs and evaluation.  EKG 05/30/2023: NSR.  Rate 72.  Coronary CTA 04/16/2024: IMPRESSION: 1. 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 This does not correlate well with a prosthetic valve gradient of 28 mm Hg. Differential includes patient prosthesis mismatch or hyperdynamic function.  TTE 08/16/2023: 1. Left ventricular ejection fraction, by estimation, is 65 to 70%. Left  ventricular ejection fraction by 3D volume is 68 %. The left ventricle has  normal function. The left ventricle has no regional wall motion  abnormalities. There is moderate  asymmetric left ventricular hypertrophy of the basal-septal segment. Left  ventricular diastolic parameters are consistent with Grade I diastolic  dysfunction (impaired relaxation).  2. Right ventricular systolic function is normal. The right ventricular  size is normal. There is normal pulmonary artery systolic pressure. The  estimated right ventricular systolic pressure is 26.0 mmHg.  3. The mitral valve is normal in structure. Mild to moderate mitral valve  regurgitation. No evidence of mitral stenosis.  4. The aortic valve has been repaired/replaced. Aortic valve  regurgitation is trivial. Aortic valve mean gradient measures 28.0 mmHg.  Aortic valve Vmax measures 3.55 m/s. Aortic valve acceleration time  measures 90 msec.   Comparison(s): Graidents have increased from prior. RSB Pedoff not done in  prior, only SSN; this may not reflect increase in gradients from prior.   Conclusion(s)/Recommendation(s): Given higher gradients that seen on this  study, consider additional imaging vs interval repeat in testing with RSB  Pedoff probe.    )         Anesthesia Quick Evaluation

## 2024-08-20 NOTE — Progress Notes (Signed)
 SDW call  Patient was given pre-op instructions over the phone. Patient verbalized understanding of instructions provided.     PCP - Charmaine Bright, PA-C Cardiologist - Dr. Maude Emmer, LOV 04/06/2024 Pulmonary:    PPM/ICD - denies Device Orders - na Rep Notified - na   Chest x-ray - na EKG -  DOS, 08/21/2024 Stress Test -08/04/2018 ECHO - 08/16/2024 Cardiac Cath - 08/22/2019 TAVR 11/13/2019  Sleep Study/sleep apnea/CPAP: denies  Non-diabetic  Blood Thinner Instructions: denies Aspirin  Instructions:Hold 2 days, states last dose 08/18/2024    ERAS Protcol - NPO   Anesthesia review: Yes. S/P TAVR, HTN, stroke, aortic stenosis   Patient denies shortness of breath, fever, cough and chest pain over the phone call  Your procedure is scheduled on Tuesday August 21, 2024  Report to [facility] Main Entrance A at  1230 PM, then check in with the Admitting office.  Call this number if you have problems the morning of surgery:  [phone]   If you have any questions prior to your surgery date call [phone]: Open Monday-Friday 8am-4pm If you experience any cold or flu symptoms such as cough, fever, chills, shortness of breath, etc. between now and your scheduled surgery, please notify us  at the above number    Remember:  Do not eat or drink after midnight the night before your surgery  Take these medicines the morning of surgery with A SIP OF WATER:  Atorvastatin , zyrtec, metoprolol , prilosec  As needed: flonase   As of today, STOP taking any Aspirin  (unless otherwise instructed by your surgeon) Aleve, Naproxen, Ibuprofen, Motrin, Advil, Goody's, BC's, all herbal medications, fish oil, and all vitamins.

## 2024-08-20 NOTE — Progress Notes (Signed)
 Pt aware of new arrival time, will be here at 0730 per Dr. Lanny office.

## 2024-08-21 ENCOUNTER — Ambulatory Visit (HOSPITAL_COMMUNITY)

## 2024-08-21 ENCOUNTER — Ambulatory Visit (HOSPITAL_COMMUNITY)
Admission: RE | Admit: 2024-08-21 | Discharge: 2024-08-21 | Disposition: A | Discharge status: Home / Self Care | Attending: Emergency Medicine | Admitting: Emergency Medicine

## 2024-08-21 ENCOUNTER — Ambulatory Visit (HOSPITAL_COMMUNITY): Payer: Self-pay | Admitting: Physician Assistant

## 2024-08-21 ENCOUNTER — Encounter (HOSPITAL_COMMUNITY)
Admission: RE | Disposition: A | Discharge status: Home / Self Care | Payer: Self-pay | Source: Home / Self Care | Attending: Emergency Medicine

## 2024-08-21 ENCOUNTER — Encounter (HOSPITAL_COMMUNITY): Payer: Self-pay | Admitting: Emergency Medicine

## 2024-08-21 DIAGNOSIS — R918 Other nonspecific abnormal finding of lung field: Secondary | ICD-10-CM

## 2024-08-21 DIAGNOSIS — Z85528 Personal history of other malignant neoplasm of kidney: Secondary | ICD-10-CM | POA: Insufficient documentation

## 2024-08-21 DIAGNOSIS — I639 Cerebral infarction, unspecified: Secondary | ICD-10-CM | POA: Diagnosis not present

## 2024-08-21 DIAGNOSIS — K219 Gastro-esophageal reflux disease without esophagitis: Secondary | ICD-10-CM | POA: Insufficient documentation

## 2024-08-21 DIAGNOSIS — Z905 Acquired absence of kidney: Secondary | ICD-10-CM | POA: Diagnosis not present

## 2024-08-21 DIAGNOSIS — Z87891 Personal history of nicotine dependence: Secondary | ICD-10-CM

## 2024-08-21 DIAGNOSIS — C3411 Malignant neoplasm of upper lobe, right bronchus or lung: Secondary | ICD-10-CM | POA: Diagnosis not present

## 2024-08-21 DIAGNOSIS — I10 Essential (primary) hypertension: Secondary | ICD-10-CM | POA: Diagnosis not present

## 2024-08-21 DIAGNOSIS — I129 Hypertensive chronic kidney disease with stage 1 through stage 4 chronic kidney disease, or unspecified chronic kidney disease: Secondary | ICD-10-CM | POA: Insufficient documentation

## 2024-08-21 DIAGNOSIS — I080 Rheumatic disorders of both mitral and aortic valves: Secondary | ICD-10-CM | POA: Insufficient documentation

## 2024-08-21 DIAGNOSIS — N189 Chronic kidney disease, unspecified: Secondary | ICD-10-CM | POA: Insufficient documentation

## 2024-08-21 DIAGNOSIS — C3432 Malignant neoplasm of lower lobe, left bronchus or lung: Secondary | ICD-10-CM | POA: Insufficient documentation

## 2024-08-21 DIAGNOSIS — Z9889 Other specified postprocedural states: Secondary | ICD-10-CM | POA: Insufficient documentation

## 2024-08-21 DIAGNOSIS — I700 Atherosclerosis of aorta: Secondary | ICD-10-CM | POA: Diagnosis not present

## 2024-08-21 HISTORY — PX: VIDEO BRONCHOSCOPY WITH ENDOBRONCHIAL NAVIGATION: SHX6175

## 2024-08-21 LAB — CBC
HCT: 39.7 % (ref 36.0–46.0)
Hemoglobin: 12.5 g/dL (ref 12.0–15.0)
MCH: 27.4 pg (ref 26.0–34.0)
MCHC: 31.5 g/dL (ref 30.0–36.0)
MCV: 86.9 fL (ref 80.0–100.0)
Platelets: 265 K/uL (ref 150–400)
RBC: 4.57 MIL/uL (ref 3.87–5.11)
RDW: 13.4 % (ref 11.5–15.5)
WBC: 7.5 K/uL (ref 4.0–10.5)
nRBC: 0 % (ref 0.0–0.2)

## 2024-08-21 LAB — BASIC METABOLIC PANEL WITH GFR
Anion gap: 13 (ref 5–15)
BUN: 14 mg/dL (ref 8–23)
CO2: 25 mmol/L (ref 22–32)
Calcium: 9.2 mg/dL (ref 8.9–10.3)
Chloride: 99 mmol/L (ref 98–111)
Creatinine, Ser: 0.6 mg/dL (ref 0.44–1.00)
GFR, Estimated: >60 mL/min (ref >60)
Glucose, Bld: 100 mg/dL — ABNORMAL HIGH (ref 70–99)
Potassium: 4 mmol/L (ref 3.5–5.1)
Sodium: 137 mmol/L (ref 135–145)

## 2024-08-21 SURGERY — VIDEO BRONCHOSCOPY WITH ENDOBRONCHIAL NAVIGATION
Anesthesia: General | Laterality: Bilateral

## 2024-08-21 MED ORDER — MIDAZOLAM HCL (PF) 2 MG/2ML IJ SOLN: 0.5000 mg | Freq: Once | INTRAMUSCULAR | Status: DC | PRN

## 2024-08-21 MED ORDER — LIDOCAINE 2% (20 MG/ML) 5 ML SYRINGE
INTRAMUSCULAR | Status: DC | PRN
  Administered 2024-08-21: 40 mg via INTRAVENOUS

## 2024-08-21 MED ORDER — PHENYLEPHRINE HCL-NACL 20-0.9 MG/250ML-% IV SOLN
INTRAVENOUS | Status: DC | PRN
  Administered 2024-08-21: 20 ug/min via INTRAVENOUS

## 2024-08-21 MED ORDER — ONDANSETRON HCL 4 MG/2ML IJ SOLN
INTRAMUSCULAR | Status: DC | PRN
  Administered 2024-08-21: 4 mg via INTRAVENOUS

## 2024-08-21 MED ORDER — METOPROLOL SUCCINATE ER 25 MG PO TB24
50.0000 mg | ORAL_TABLET | Freq: Once | ORAL | Status: AC
  Administered 2024-08-21: 50 mg via ORAL
  Filled 2024-08-21: qty 2

## 2024-08-21 MED ORDER — FENTANYL CITRATE (PF) 100 MCG/2ML IJ SOLN: 25.0000 ug | INTRAMUSCULAR | Status: DC | PRN

## 2024-08-21 MED ORDER — PROPOFOL 10 MG/ML IV BOLUS
INTRAVENOUS | Status: DC | PRN
  Administered 2024-08-21: 80 mg via INTRAVENOUS
  Administered 2024-08-21: 10 mg via INTRAVENOUS
  Administered 2024-08-21: 15 mg via INTRAVENOUS

## 2024-08-21 MED ORDER — MEPERIDINE HCL 50 MG/ML IJ SOLN
6.2500 mg | INTRAMUSCULAR | Status: DC | PRN
  Filled 2024-08-21: qty 1

## 2024-08-21 MED ORDER — PROPOFOL 500 MG/50ML IV EMUL
INTRAVENOUS | Status: DC | PRN
  Administered 2024-08-21: 85 ug/kg/min via INTRAVENOUS

## 2024-08-21 MED ORDER — OXYCODONE HCL 5 MG/5ML PO SOLN: 5.0000 mg | Freq: Once | ORAL | Status: DC | PRN

## 2024-08-21 MED ORDER — OXYCODONE HCL 5 MG PO TABS: 5.0000 mg | ORAL_TABLET | Freq: Once | ORAL | Status: DC | PRN

## 2024-08-21 MED ORDER — DEXAMETHASONE SOD PHOSPHATE PF 10 MG/ML IJ SOLN
INTRAMUSCULAR | Status: DC | PRN
  Administered 2024-08-21: 10 mg via INTRAVENOUS

## 2024-08-21 MED ORDER — SUGAMMADEX SODIUM 200 MG/2ML IV SOLN
INTRAVENOUS | Status: DC | PRN
  Administered 2024-08-21: 160 mg via INTRAVENOUS

## 2024-08-21 MED ORDER — GLYCOPYRROLATE PF 0.2 MG/ML IJ SOSY
PREFILLED_SYRINGE | INTRAMUSCULAR | Status: DC | PRN
  Administered 2024-08-21: .2 mg via INTRAVENOUS

## 2024-08-21 MED ORDER — ACETAMINOPHEN 500 MG PO TABS
1000.0000 mg | ORAL_TABLET | Freq: Once | ORAL | Status: AC
  Administered 2024-08-21: 1000 mg via ORAL
  Filled 2024-08-21: qty 2

## 2024-08-21 MED ORDER — ROCURONIUM BROMIDE 10 MG/ML (PF) SYRINGE
PREFILLED_SYRINGE | INTRAVENOUS | Status: DC | PRN
  Administered 2024-08-21: 50 mg via INTRAVENOUS
  Administered 2024-08-21: 30 mg via INTRAVENOUS

## 2024-08-21 MED ORDER — CHLORHEXIDINE GLUCONATE 0.12 % MT SOLN
15.0000 mL | Freq: Once | OROMUCOSAL | Status: AC
  Administered 2024-08-21: 15 mL via OROMUCOSAL
  Filled 2024-08-21: qty 15

## 2024-08-21 MED ORDER — LACTATED RINGERS IV SOLN: INTRAVENOUS | Status: DC

## 2024-08-21 SURGICAL SUPPLY — 37 items
ADAPTER BRONCHOSCOPE OLYMPUS (ADAPTER) ×2 IMPLANT
ADAPTER VALVE BIOPSY EBUS (MISCELLANEOUS) IMPLANT
BAG COUNTER SPONGE SURGICOUNT (BAG) ×2 IMPLANT
BRUSH CYTOL CELLEBRITY 1.5X140 (MISCELLANEOUS) ×2 IMPLANT
BRUSH SUPERTRAX BIOPSY (INSTRUMENTS) IMPLANT
BRUSH SUPERTRAX NDL-TIP CYTO (INSTRUMENTS) ×2 IMPLANT
CANISTER SUCTION 3000ML PPV (SUCTIONS) ×2 IMPLANT
CNTNR URN SCR LID CUP LEK RST (MISCELLANEOUS) ×2 IMPLANT
COVER BACK TABLE 60X90IN (DRAPES) ×2 IMPLANT
FILTER STRAW FLUID ASPIR (MISCELLANEOUS) IMPLANT
FORCEPS BIOP 1.5 SINGLE USE (MISCELLANEOUS) ×2 IMPLANT
FORCEPS BIOP SUPERTRX PREMAR (INSTRUMENTS) ×2 IMPLANT
GAUZE SPONGE 4X4 12PLY STRL (GAUZE/BANDAGES/DRESSINGS) ×2 IMPLANT
GLOVE BIO SURGEON STRL SZ7.5 (GLOVE) ×4 IMPLANT
GOWN STRL REUS W/ TWL LRG LVL3 (GOWN DISPOSABLE) ×4 IMPLANT
KIT CLEAN ENDO COMPLIANCE (KITS) ×2 IMPLANT
KIT LOCATABLE GUIDE (CANNULA) IMPLANT
KIT MARKER FIDUCIAL DELIVERY (KITS) IMPLANT
KIT TURNOVER KIT B (KITS) ×2 IMPLANT
MARKER SKIN DUAL TIP RULER LAB (MISCELLANEOUS) ×2 IMPLANT
NDL SUPERTRX PREMARK BIOPSY (NEEDLE) ×2 IMPLANT
NEEDLE SUPERTRX PREMARK BIOPSY (NEEDLE) ×1 IMPLANT
OIL SILICONE PENTAX (PARTS (SERVICE/REPAIRS)) ×2 IMPLANT
PAD ARMBOARD POSITIONER FOAM (MISCELLANEOUS) ×4 IMPLANT
PATCHES PATIENT (LABEL) ×6 IMPLANT
SOLN 0.9% NACL POUR BTL 1000ML (IV SOLUTION) ×2 IMPLANT
SOLN STERILE WATER BTL 1000 ML (IV SOLUTION) ×2 IMPLANT
SYR 20ML ECCENTRIC (SYRINGE) ×2 IMPLANT
SYR 20ML LL LF (SYRINGE) ×2 IMPLANT
SYR 50ML SLIP (SYRINGE) ×2 IMPLANT
SuperLock Fiducial Marker IMPLANT
TOWEL GREEN STERILE FF (TOWEL DISPOSABLE) ×2 IMPLANT
TRAP SPECIMEN MUCUS 40CC (MISCELLANEOUS) IMPLANT
TUBE CONNECTING 20X1/4 (TUBING) ×2 IMPLANT
UNDERPAD 30X36 HEAVY ABSORB (UNDERPADS AND DIAPERS) ×2 IMPLANT
VALVE BIOPSY SINGLE USE (MISCELLANEOUS) ×2 IMPLANT
VALVE SUCTION BRONCHIO DISP (MISCELLANEOUS) ×2 IMPLANT

## 2024-08-21 NOTE — Op Note (Signed)
 Procedure Note  Patient: [name]  [employer] Cios mobile C-arm was utilized to identify and biopsy left lower lobe superior segment nodule and right upper lobe nodule.  Needle-in-lesion was confirmed using real-time Cios imaging, and images were uploaded to PACS.    Left lower lobe superior segment    Right upper lobe  Lamar Chris, MD, PhD 08/21/2024, 11:12 AM [facility] [phone] or if no answer before 7:00PM call [phone] For any issues after 7:00PM please call eLink [phone]

## 2024-08-21 NOTE — Anesthesia Postprocedure Evaluation (Signed)
 Anesthesia Post Note  Patient: [name]  Procedure(s) Performed: VIDEO BRONCHOSCOPY WITH ENDOBRONCHIAL NAVIGATION (Bilateral)     Patient location during evaluation: PACU Anesthesia Type: General Level of consciousness: awake and alert, oriented and patient cooperative Pain management: pain level controlled Vital Signs Assessment: post-procedure vital signs reviewed and stable Respiratory status: spontaneous breathing, nonlabored ventilation and respiratory function stable Cardiovascular status: blood pressure returned to baseline and stable Postop Assessment: no apparent nausea or vomiting and able to ambulate Anesthetic complications: no   No notable events documented.  Last Vitals:  Vitals:   08/21/24 1130 08/21/24 1135  BP: 110/65 104/66  Pulse: 95 94  Resp: 15 19  Temp:  36.9 C  SpO2: 98% 99%    Last Pain:  Vitals:   08/21/24 1130  TempSrc:   PainSc: 0-No pain                 Khalilah Hoke,E. Kayanna Mckillop

## 2024-08-21 NOTE — Op Note (Signed)
 Video Bronchoscopy with Robotic Assisted Bronchoscopic Navigation   Date of Operation: 08/21/2024   Pre-op Diagnosis: Bilateral pulmonary nodules  Post-op Diagnosis: Same  Surgeon: Lamar Chris  Assistants: None  Anesthesia: General endotracheal anesthesia  Operation: Flexible video fiberoptic bronchoscopy with robotic assistance and biopsies.  Estimated Blood Loss: Minimal  Complications: None  Indications and History: [name] is a 68 y.o. female with history of former tobacco use.  We have been following pulmonary nodules on CT scan of the chest.  There is been some interval enlargement and a left lower lobe superior segmental nodule.  Also a new 9 mm right upper lobe ground glass nodule on most recent imaging.  Recommendation made to achieve a tissue diagnosis via robotic assisted navigational bronchoscopy.  The risks, benefits, complications, treatment options and expected outcomes were discussed with the patient.  The possibilities of pneumothorax, pneumonia, reaction to medication, pulmonary aspiration, perforation of a viscus, bleeding, failure to diagnose a condition and creating a complication requiring transfusion or operation were discussed with the patient who freely signed the consent.    Description of Procedure: The patient was seen in the Preoperative Area, was examined and was deemed appropriate to proceed.  The patient was taken to [facility] room 3, identified as Cathlean LELON Clink and the procedure verified as Flexible Video Fiberoptic Bronchoscopy.  A Time Out was held and the above information confirmed.   Prior to the date of the procedure a high-resolution CT scan of the chest was performed. Utilizing ION software program a virtual tracheobronchial tree was generated to allow the creation of distinct navigation pathways to the patient's parenchymal abnormalities. After being taken to the operating room general anesthesia was initiated and the patient  was  orally intubated. The video fiberoptic bronchoscope was introduced via the endotracheal tube and a general inspection was performed which showed some mucosal nodularity and hyperpigmentation without overt endobronchial lesion at the distal trachea extending towards the right mainstem bronchus.  Otherwise there was normal right and left lung anatomy.  Endobronchial brushings were performed at the distal trachea to be sent for cytology.  Aspiration of the bilateral mainstems was completed to remove any remaining secretions. Robotic catheter inserted into patient's endotracheal tube.         Target #1 left lower lobe superior segment mixed density nodule: The distinct navigation pathways prepared prior to this procedure were then utilized to navigate to patient's lesion identified on CT scan. The robotic catheter was secured into place and the vision probe was withdrawn.  Lesion location was approximated using fluoroscopy.  Local registration and targeting was performed using [employer] Cios mobile C-arm three-dimensional imaging. Under fluoroscopic guidance transbronchial brushings, transbronchial needle biopsies, and transbronchial cryoprobe biopsies were performed to be sent for cytology and pathology.  Needle-in-lesion was confirmed using Cios mobile C-arm. Under fluoroscopic guidance a single fiducial marker was placed adjacent to the nodule.  Target #2 right upper lobe ground glass nodule: The distinct navigation pathways prepared prior to this procedure were then utilized to navigate to patient's lesion identified on CT scan. The robotic catheter was secured into place and the vision probe was withdrawn.  Lesion location was approximated using fluoroscopy.  Local registration and targeting was performed using [employer] Cios mobile C-arm three-dimensional imaging. Under fluoroscopic guidance transbronchial needle biopsies and transbronchial cryoprobe biopsies were performed to be  sent for cytology and pathology.  Needle-in-lesion was confirmed using Cios mobile C-arm.  Under fluoroscopic guidance a single fiducial marker was  placed adjacent to the nodule.  At the end of the procedure a general airway inspection was performed and there was no evidence of active bleeding. The bronchoscope was removed.  The patient tolerated the procedure well. There was no significant blood loss and there were no obvious complications. A post-procedural chest x-ray is pending.  Samples Target #1: 1. Transbronchial brushings from left lower lobe nodule 2. Transbronchial Wang needle biopsies from left lower lobe nodule 3. Transbronchial forceps biopsies from left lower lobe nodule  Samples Target #2: 1. Transbronchial Wang needle biopsies from right upper lobe nodule 2. Transbronchial cryoprobe biopsies from right upper lobe nodule  Endobronchial samples: 1. Endobronchial brushings from distal trachea  Plans:  The patient will be discharged from the PACU to home when recovered from anesthesia and after chest x-ray is reviewed. We will review the cytology, pathology and microbiology results with the patient when they become available. Outpatient followup will be with Dr Shelah.    Lamar Shelah, MD, PhD 08/21/2024, 11:10 AM [facility] [phone] or if no answer before 7:00PM call [phone] For any issues after 7:00PM please call eLink [phone]

## 2024-08-21 NOTE — Transfer of Care (Signed)
 Immediate Anesthesia Transfer of Care Note  Patient: [name]  Procedure(s) Performed: VIDEO BRONCHOSCOPY WITH ENDOBRONCHIAL NAVIGATION (Bilateral)  Patient Location: PACU  Anesthesia Type:General  Level of Consciousness: awake, drowsy, patient cooperative, and responds to stimulation  Airway & Oxygen Therapy: Patient Spontanous Breathing and Patient connected to face mask oxygen  Post-op Assessment: Report given to RN and Post -op Vital signs reviewed and stable  Post vital signs: Reviewed and stable  Last Vitals:  Vitals Value Taken Time  BP 106/67 08/21/24 11:05  Temp 36.9 C 08/21/24 11:05  Pulse 96 08/21/24 11:08  Resp 16 08/21/24 11:08  SpO2 89 % 08/21/24 11:08  Vitals shown include unfiled device data.  Last Pain:  Vitals:   08/21/24 1105  TempSrc:   PainSc: 0-No pain         Complications: No notable events documented.

## 2024-08-21 NOTE — Interval H&P Note (Signed)
 History and Physical Interval Note:  08/21/2024 9:19 AM  [name]  has presented today for surgery, with the diagnosis of Bilateral ground glass pulmonary nodules.  The various methods of treatment have been discussed with the patient and family. After consideration of risks, benefits and other options for treatment, the patient has consented to  Procedure(s): VIDEO BRONCHOSCOPY WITH ENDOBRONCHIAL NAVIGATION (Bilateral) as a surgical intervention.  The patient's history has been reviewed, patient examined, no change in status, stable for surgery.  I have reviewed the patient's chart and labs.  Questions were answered to the patient's satisfaction.     Lamar GORMAN Chris

## 2024-08-21 NOTE — Discharge Instructions (Signed)
 Flexible Bronchoscopy, Care After This sheet gives you information about how to care for yourself after your test. Your doctor may also give you more specific instructions. If you have problems or questions, contact your doctor. Follow these instructions at home: Eating and drinking When you are wide awake, your numbness is gone and your cough and gag reflexes have come back, you may: Start eating only soft foods. Slowly drink liquids. Six hours after the test, go back to your normal diet. Driving Do not drive for 24 hours if you were given a medicine to help you relax (sedative). Do not drive or use heavy machinery while taking prescription pain medicine. General instructions Take over-the-counter and prescription medicines only as told by your doctor. Return to your normal activities as told. Ask what activities are safe for you. Do not use any products that have nicotine or tobacco in them. This includes cigarettes and e-cigarettes. If you need help quitting, ask your doctor. Keep all follow-up visits as told by your doctor. This is important. It is very important if you had a tissue sample (biopsy) taken. Get help right away if: You have shortness of breath that gets worse. You get light-headed. You feel like you are going to pass out (faint). You have chest pain. You cough up: More than a little blood. More blood than before. Summary Do not use cigarettes. Do not use e-cigarettes. Seek care in the Emergency Department right away if you have chest pain or shortness of breath. Call or MyChart Message our office for any questions or problems at [phone].  Okay to restart aspirin  on 08/22/2024   This information is not intended to replace advice given to you by your health care provider. Make sure you discuss any questions you have with your health care provider.

## 2024-08-21 NOTE — Anesthesia Procedure Notes (Signed)
 Procedure Name: Intubation Date/Time: 08/21/2024 9:47 AM  Performed by: Myrna Homer, CRNAPre-anesthesia Checklist: Patient identified, Emergency Drugs available, Suction available and Patient being monitored Patient Re-evaluated:Patient Re-evaluated prior to induction Oxygen Delivery Method: Circle System Utilized Preoxygenation: Pre-oxygenation with 100% oxygen Induction Type: IV induction Ventilation: Mask ventilation without difficulty Laryngoscope Size: Mac and 3 Grade View: Grade I Tube type: Oral Tube size: 8.0 mm Number of attempts: 1 Airway Equipment and Method: Stylet and Oral airway Placement Confirmation: ETT inserted through vocal cords under direct vision, positive ETCO2 and breath sounds checked- equal and bilateral Secured at: 21 cm Tube secured with: Tape Dental Injury: Teeth and Oropharynx as per pre-operative assessment

## 2024-08-22 ENCOUNTER — Encounter (HOSPITAL_COMMUNITY): Payer: Self-pay | Admitting: Emergency Medicine

## 2024-08-24 LAB — CYTOLOGY - NON PAP

## 2024-08-27 ENCOUNTER — Ambulatory Visit: Admitting: Acute Care

## 2024-08-27 ENCOUNTER — Encounter: Payer: Self-pay | Admitting: Acute Care

## 2024-08-27 VITALS — BP 138/72 | HR 107 | Temp 98.3°F | Ht 66.0 in | Wt 176.0 lb

## 2024-08-27 DIAGNOSIS — Z9889 Other specified postprocedural states: Secondary | ICD-10-CM

## 2024-08-27 DIAGNOSIS — Z85528 Personal history of other malignant neoplasm of kidney: Secondary | ICD-10-CM | POA: Diagnosis not present

## 2024-08-27 DIAGNOSIS — Z87891 Personal history of nicotine dependence: Secondary | ICD-10-CM

## 2024-08-27 DIAGNOSIS — C3490 Malignant neoplasm of unspecified part of unspecified bronchus or lung: Secondary | ICD-10-CM

## 2024-08-27 DIAGNOSIS — Z905 Acquired absence of kidney: Secondary | ICD-10-CM | POA: Diagnosis not present

## 2024-08-27 DIAGNOSIS — C3411 Malignant neoplasm of upper lobe, right bronchus or lung: Secondary | ICD-10-CM | POA: Diagnosis not present

## 2024-08-27 DIAGNOSIS — R9389 Abnormal findings on diagnostic imaging of other specified body structures: Secondary | ICD-10-CM

## 2024-08-27 DIAGNOSIS — Z801 Family history of malignant neoplasm of trachea, bronchus and lung: Secondary | ICD-10-CM

## 2024-08-27 DIAGNOSIS — C3432 Malignant neoplasm of lower lobe, left bronchus or lung: Secondary | ICD-10-CM

## 2024-08-27 DIAGNOSIS — R911 Solitary pulmonary nodule: Secondary | ICD-10-CM

## 2024-08-27 NOTE — Progress Notes (Signed)
 History of Present Illness [name] is a 68 y.o. female former smoker ( Quit 2007) referred to Dr. Shelah 07/2024 for follow up of a Left lower lobe ground glass pulmonary lung nodule. Pt. Has a history of prior renal cell carcinoma with right nephrectomy in 2020. She also has a significant cardiac history including TAVR.  Synopsis 68 y.o. female former smoker referred to Dr. Shelah 07/2024 for follow up of a Left lower lobe ground glass pulmonary lung nodule. Pt. Has a history of prior renal cell carcinoma with right nephrectomy in 2020. Her serial CT scans had been stable up to 2020 but there was a left lower lobe ground glass pulmonary nodule that had been stable for 3 years and needed completion of its follow-up. She underwent a repeat CT chest 06/22/2024 . This showed multiple bilateral pulmonary nodules some solid and some ground glass.  There was a 1.6 x 1.2 cm mixed density nodule in the superior segment of the left lower lobe that is slightly larger and more conspicuous compared with 2018.  The solid component is 6 mm.  There was  also a new ground glass nodule in the inferior right upper lobe 10 x 8 mm. Dr. Byrum gave the patient the option of a 3 month follow up CT Chest, vs biopsy now for definitive diagnosis. She opted to have the biopsy now.  She underwent bronchoscopy with biopsies on 08/21/2024. She is here today to review results of the biopsy, and to ensure she has done well since the procedure.   08/27/2024 Discussed the use of AI scribe software for clinical note transcription with the patient, who gave verbal consent to proceed.  History of Present Illness [name] is a 68 year old female who presents for follow up after bronchoscopy with biopsies.She states she has done well since the procedure. No significant bleeding, no fever, discolored secretions, worsening shortness of breath or adverse reaction to anesthesia. We discussed the results of the biopsy. Both the  RUL and LLL were positive for adenocarcinoma. We discussed that this is a non small cell lung cancer.  The distal tracheal brushing was negative for  malignant cells.  We discussed that we need to complete staging to ensure there are no other sites of concern in areas we have not imaged. Plan is for a PET scan and MRI Brain to complete staging. We will also do PFT's as patient is interested in a surgical referral. Although she understands surgery may not be an option based on final staging after PET and MR  brain.  She has a history of smoking, having quit many years ago, possibly around 2007, though she is uncertain of the exact year. No history of pacemaker, hip replacement, or metal implants, and she has undergone MRIs in the past without issues.  She feels well overall, with no significant symptoms impacting her daily life.No weight loss or hemoptysis.   Her family history is significant for lung cancer, with both her mother and sister having died from the disease, specifically small cell lung cancer. Her sister was a previous smoker, and her mother had smoked in the past.  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      Test Results: Cytology 08/21/2024  FINAL MICROSCOPIC DIAGNOSIS:  A. LUNG, LLL TARGET 1, FINE NEEDLE ASPIRATION  BIOPSY:  - Non-small cell carcinoma consistent with adenocarcinoma, see comment.  B. LUNG, LLL TARGET 1, BRUSHING:  - Non-small cell carcinoma consistent with adenocarcinoma   C. LUNG, RUL TARGET 2, FINE NEEDLE ASPIRATION  BIOPSY:  - Non-small cell carcinoma consistent with adenocarcinoma, see comment   FINAL MICROSCOPIC DIAGNOSIS:  D. DISTAL TRACHEA ENDOBRONCHIAL BRUSHING:  - No malignant cells identified  - Benign bronchial cells with reactive changes   Super D CT chest 06/22/2024 Mixed solid and ground-glass nodule of the dependent superior segment left lower lobe measures 1.6 x  1.2 cm, with a solid component measuring 0.6 cm. This is increased in size and conspicuity when compared to remote prior examination dated 08/25/2017, and although behavior has been very indolent over this long period of follow-up is worrisome for indolent adenocarcinoma. Given location and size of solid component, this may be amenable to metabolic characterization by PET-CT. 2. New ground-glass nodule of the anterior inferior right upper lobe measuring 1.0 x 0.8 cm. This is nonspecific and may be infectious or inflammatory but as above indolent adenocarcinoma not excluded. 3. Numerous additional small solid and ground-glass nodules not significantly changed when compared to examination dated 2018 and presumed benign. 4. Aortic valve stent endograft.    Latest Ref Rng & Units 08/21/2024    8:00 AM 06/25/2022    9:06 AM 04/21/2022    8:49 PM  CBC  WBC 4.0 - 10.5 K/uL 7.5  6.9  8.2   Hemoglobin 12.0 - 15.0 g/dL 87.4  87.1  87.1   Hematocrit 36.0 - 46.0 % 39.7  39.8  38.3   Platelets 150 - 400 K/uL 265  244  239        Latest Ref Rng & Units 08/21/2024    8:00 AM 04/06/2024   11:22 AM 06/25/2022    9:06 AM  BMP  Glucose 70 - 99 mg/dL 899  95  878   BUN 8 - 23 mg/dL 14  14  9    Creatinine 0.44 - 1.00 mg/dL 9.39  9.33  9.33   BUN/Creat Ratio 12 - 28  21    Sodium 135 - 145 mmol/L 137  141  140   Potassium 3.5 - 5.1 mmol/L 4.0  4.9  3.4   Chloride 98 - 111 mmol/L 99  101  104   CO2 22 - 32 mmol/L 25  24  29    Calcium  8.9 - 10.3 mg/dL 9.2  89.8  9.2     BNP    Component Value Date/Time   BNP 28.0 11/09/2019 0930    ProBNP    Component Value Date/Time   PROBNP 102 01/15/2022 1623    PFT No results found for: FEV1PRE, FEV1POST, FVCPRE, FVCPOST, TLC, DLCOUNC, PREFEV1FVCRT, PSTFEV1FVCRT  DG Chest Port 1 View Result Date: 08/21/2024 EXAM: 1 VIEW XRAY OF THE CHEST 08/21/2024 11:31:00 AM COMPARISON: None available. CLINICAL HISTORY: S/P bronchoscopy with biopsy  [id number]. S/P bronchoscopy FINDINGS: LINES, TUBES AND DEVICES: Cardiac monitoring leads are noted. LUNGS AND PLEURA: Left basilar opacity is present. A possible small left pleural effusion is noted. HEART AND MEDIASTINUM: TAVR is noted. Aortic atherosclerosis is present. BONES AND SOFT TISSUES: No acute osseous abnormality. IMPRESSION: 1. Left basilar opacity, which may be due to infiltrate or atelectasis. 2. Possible small left pleural effusion. Electronically signed by: Norleen Kil MD 08/21/2024 11:52 AM EDT RP Workstation: [id number]   DG C-ARM BRONCHOSCOPY Result Date: 08/21/2024 C-ARM BRONCHOSCOPY: Fluoroscopy was utilized by the requesting physician.  No radiographic interpretation.   DG C-Arm 1-60 Min-No Report Result Date: 08/21/2024 Fluoroscopy  was utilized by the requesting physician.  No radiographic interpretation.   ECHOCARDIOGRAM COMPLETE Result Date: 08/16/2024    ECHOCARDIOGRAM REPORT   Patient Name:   [name] Date of Exam: 08/16/2024 Medical Rec #:  [id number]        Height:       66.0 in Accession #:    [id number]       Weight:       177.0 lb Date of Birth:  02/11/1956        BSA:          1.899 m Patient Age:    68 years         BP:           121/82 mmHg Patient Gender: F                HR:           75 bpm. Exam Location:  [address] Procedure: 2D Echo, 3D Echo, Cardiac Doppler, Color Doppler and Strain Analysis            (Both Spectral and Color Flow Doppler were utilized during            procedure). Indications:    Z95.2 TAVR  History:        Patient has prior history of Echocardiogram examinations, most                 recent 08/16/2023. Renal CA and Stroke; Risk                 Factors:Hypertension and Former Smoker. Previous echo LVEF 60%                 TAVR mean gradient 28 mmHg. PAP 26 mmHg.                 Aortic Valve: 23 mm Sapien prosthetic, stented (TAVR) valve is                 present in the aortic position. Procedure Date: 11/13/2019.  Sonographer:    Nolon Berg BA, RDCS Referring Phys: [id number] MAUDE JAYSON EMMER IMPRESSIONS  1. Left ventricular ejection fraction, by estimation, is 65 to 70%. The left ventricle has normal function. The left ventricle has no regional wall motion abnormalities. There is mild left ventricular hypertrophy. The average left ventricular global longitudinal strain is -18.7 %. The global longitudinal strain is normal.  2. Right ventricular systolic function is normal. The right ventricular size is normal.  3. The mitral valve is normal in structure. Trivial mitral valve regurgitation.  4. S/p TAVR (23 mm Sapien prosthesis; procedure date 11/13/2019) Peak nad mena gradients through the valve are 34 and 22 mm Hg respectively Dimensionless valve index is 0.4 OVerall relatively unchanged from echo done in October 2024. The aortic valve has  been repaired/replaced. Aortic valve regurgitation is not visualized. There is a 23 mm Sapien prosthetic (TAVR) valve present in the aortic position. Procedure Date: 11/13/2019. FINDINGS  Left Ventricle: Left ventricular ejection fraction, by estimation, is 65 to 70%. The left ventricle has normal function. The left ventricle has no regional wall motion abnormalities. The average left ventricular global longitudinal strain is -18.7 %. Strain was performed and the global longitudinal strain is normal. The left ventricular internal cavity size was normal in size. There is mild left ventricular hypertrophy. Right Ventricle: The right ventricular size is normal. Right vetricular wall thickness was not assessed. Right ventricular systolic function is  normal. Left Atrium: Left atrial size was normal in size. Right Atrium: Right atrial size was normal in size. Pericardium: Trivial pericardial effusion is present. Mitral Valve: The mitral valve is normal in structure. Trivial mitral valve regurgitation. Tricuspid Valve: The tricuspid valve is normal in structure. Tricuspid valve regurgitation is trivial. Aortic Valve: S/p  TAVR (23 mm Sapien prosthesis; procedure date 11/13/2019) Peak nad mena gradients through the valve are 34 and 22 mm Hg respectively Dimensionless valve index is 0.4 OVerall relatively unchanged from echo done in October 2024. The aortic  valve has been repaired/replaced. Aortic valve regurgitation is not visualized. Aortic valve mean gradient measures 22.0 mmHg. Aortic valve peak gradient measures 34.3 mmHg. Aortic valve area, by VTI measures 2.11 cm. There is a 23 mm Sapien prosthetic, stented (TAVR) valve present in the aortic position. Procedure Date: 11/13/2019. Pulmonic Valve: The pulmonic valve was normal in structure. Pulmonic valve regurgitation is mild. Aorta: The aortic root and ascending aorta are structurally normal, with no evidence of dilitation. IAS/Shunts: No atrial level shunt detected by color flow Doppler.  LEFT VENTRICLE PLAX 2D LVIDd:         4.20 cm   Diastology LVIDs:         2.25 cm   LV e' medial:    5.17 cm/s LV PW:         1.10 cm   LV E/e' medial:  18.0 LV IVS:        1.20 cm   LV e' lateral:   4.79 cm/s LVOT diam:     2.60 cm   LV E/e' lateral: 19.5 LV SV:         124 LV SV Index:   65        2D Longitudinal Strain LVOT Area:     5.31 cm  2D Strain GLS (A4C):   -18.5 %                          2D Strain GLS (A3C):   -13.9 %                          2D Strain GLS (A2C):   -23.8 %                          2D Strain GLS Avg:     -18.7 %                           3D Volume EF:                          3D EF:        60 %                          LV EDV:       88 ml                          LV ESV:       36 ml                          LV SV:        53 ml RIGHT VENTRICLE  IVC RV Basal diam:  3.50 cm     IVC diam: 1.90 cm RV Mid diam:    3.30 cm RV S prime:     16.50 cm/s  PULMONARY VEINS TAPSE (M-mode): 2.5 cm      A Reversal Velocity: 35.00 cm/s RVSP:           26.4 mmHg   Diastolic Velocity:  40.40 cm/s                             S/D Velocity:        1.40                              Systolic Velocity:   57.10 cm/s LEFT ATRIUM             Index        RIGHT ATRIUM           Index LA diam:        4.80 cm 2.53 cm/m   RA Pressure: 3.00 mmHg LA Vol (A2C):   32.4 ml 17.06 ml/m  RA Area:     15.30 cm LA Vol (A4C):   40.4 ml 21.28 ml/m  RA Volume:   42.60 ml  22.44 ml/m LA Biplane Vol: 36.4 ml 19.17 ml/m  AORTIC VALVE AV Area (Vmax):    2.32 cm AV Area (Vmean):   2.28 cm AV Area (VTI):     2.11 cm AV Vmax:           293.00 cm/s AV Vmean:          204.200 cm/s AV VTI:            0.587 m AV Peak Grad:      34.3 mmHg AV Mean Grad:      22.0 mmHg LVOT Vmax:         128.00 cm/s LVOT Vmean:        87.500 cm/s LVOT VTI:          0.233 m LVOT/AV VTI ratio: 0.40  AORTA Ao Root diam: 3.00 cm Ao Asc diam:  3.80 cm MITRAL VALVE                TRICUSPID VALVE MV Area (PHT): 4.39 cm     TR Peak grad:   23.4 mmHg MV Decel Time: 173 msec     TR Vmax:        242.00 cm/s MV E velocity: 93.30 cm/s   Estimated RAP:  3.00 mmHg MV A velocity: 108.00 cm/s  RVSP:           26.4 mmHg MV E/A ratio:  0.86                             SHUNTS                             Systemic VTI:  0.23 m                             Systemic Diam: 2.60 cm Vina Gull MD Electronically signed by Vina Gull MD Signature Date/Time: 08/16/2024/6:09:29 PM    Final      Past medical hx Past Medical History:  Diagnosis Date  Diverticulitis, colon    GERD (gastroesophageal reflux disease)    H/O colonoscopy 07/19/2022   Hypertension    Mild dilation of ascending aorta    Pulmonary nodules    seen on pre TAVR CT, needs follow up    Renal cancer [facility]) 2007   Surgically removed from right kidney   S/P TAVR (transcatheter aortic valve replacement)    s/p TAVR w/ a Edwards Sapien 3 Ultra THV via the TF approach on 11/13/19 with Dr. Wonda and Lucas.    Severe aortic stenosis    Stroke (cerebrum) (HCC)    a. MRI of brain 12/2021 [facility] showed punctate subacute versus chronic white matter lacunar infarct in the posterior left corona  radiata/centrum semiovale.     Social History   Tobacco Use   Smoking status: Former    Current packs/day: 0.00    Average packs/day: 1 pack/day for 10.0 years (10.0 ttl pk-yrs)    Types: Cigarettes    Start date: 10/26/1995    Quit date: 10/25/2005    Years since quitting: 18.8   Smokeless tobacco: Never   Tobacco comments:    quit around 2007   Vaping Use   Vaping status: Never Used  Substance Use Topics   Alcohol use: Yes    Comment: occ   Drug use: No    Ms.Marron reports that she quit smoking about 18 years ago. Her smoking use included cigarettes. She started smoking about 28 years ago. She has a 10 pack-year smoking history. She has never used smokeless tobacco. She reports current alcohol use. She reports that she does not use drugs.  Tobacco Cessation: Counseling given: Not Answered Tobacco comments: quit around 2007  Former smoker, quit in 2007   Past surgical hx, Family hx, Social hx all reviewed.  Current Outpatient Medications on File Prior to Visit  Medication Sig   aspirin  EC 81 MG tablet Take 1 tablet (81 mg total) by mouth daily. Swallow whole.   atorvastatin  (LIPITOR) 40 MG tablet TAKE 1 TABLET BY MOUTH DAILY   cetirizine (ZYRTEC) 10 MG tablet Take 10 mg by mouth daily.   fluticasone  (FLONASE ) 50 MCG/ACT nasal spray Place 1 spray into both nostrils as needed for allergies or rhinitis.   ibuprofen (ADVIL) 200 MG tablet Take 200 mg by mouth every 4 (four) hours as needed.   magnesium  oxide (MAG-OX) 400 MG tablet Take 1 tablet (400 mg total) by mouth daily.   metoprolol  succinate (TOPROL -XL) 50 MG 24 hr tablet TAKE 1 TABLET BY MOUTH DAILY WITH OR IMMEDIATELY FOLLOWING A MEAL (Patient taking differently: Take 50 mg by mouth daily.)   omeprazole  (PRILOSEC) 40 MG capsule TAKE 1 CAPSULE BY MOUTH DAILY   OVER THE COUNTER MEDICATION Take 1 tablet by mouth daily at 6 (six) AM. Trans pterostilbene   No current facility-administered medications on file prior to visit.      Allergies  Allergen Reactions   Diphenhydramine Hcl     loopy   Protonix  [Pantoprazole ] Nausea And Vomiting    Nausea, headache, heightened senses.    Ciprofloxacin Itching   Codeine Itching   Diphenhydramine Other (See Comments)   Morphine And Codeine Nausea Only    Anxiety and Nausea     Review Of Systems:  Constitutional:   No  weight loss, night sweats,  Fevers, chills, fatigue, or  lassitude.  HEENT:   No headaches,  Difficulty swallowing,  Tooth/dental problems, or  Sore throat,  No sneezing, itching, ear ache, nasal congestion, post nasal drip,   CV:  No chest pain,  Orthopnea, PND, swelling in lower extremities, anasarca, dizziness, palpitations, syncope.   GI  No heartburn, indigestion, abdominal pain, nausea, vomiting, diarrhea, change in bowel habits, loss of appetite, bloody stools.   Resp: No shortness of breath with exertion or at rest.  No excess mucus, no productive cough,  No non-productive cough,  No coughing up of blood.  No change in color of mucus.  No wheezing.  No chest wall deformity  Skin: no rash or lesions.  GU: no dysuria, change in color of urine, no urgency or frequency.  No flank pain, no hematuria   MS:  No joint pain or swelling.  No decreased range of motion.  No back pain.  Psych:  No change in mood or affect. No depression or anxiety.  No memory loss.   Vital Signs BP 138/72   Pulse (!) 107   Temp 98.3 F (36.8 C) (Oral)   Ht 5' 6 (1.676 m)   Wt 176 lb (79.8 kg)   SpO2 97% Comment: room air  BMI 28.41 kg/m     Physical Exam GENERAL: No distress, alert and oriented times 3. EARS NOSE THROAT: No sinus tenderness, tympanic membranes clear, pale nasal mucosa, no oral exudate, no post nasal drip, no lymphadenopathy. CHEST: Lungs clear to auscultation bilaterally. No wheeze, rales, dullness, no accessory muscle use, no nasal flaring, no sternal retractions. CARDIAC: S1, S2, regular rate and rhythm, no  murmur. ABDOMINAL: Soft, non tender. ND, BS present, EXTREMITIES: No clubbing, cyanosis, edema. No obvious deformities. NEUROLOGICAL: Normal strength. Alert and oriented x 3, MAE x 4. SKIN: No rashes, warm and dry. No obvious skin lesions. PSYCHIATRIC: Normal mood and behavior.   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 Further staging required to confirm stage of disease  Former smoker  Family history of lung cancer Personal history or renal cell cancer s/p nephrectomy  - Ordered PET scan to stage cancer and assess for metastasis. - Ordered MRI of the brain to complete staging. - Ordered pulmonary function tests to assess lung capacity for potential surgical intervention. - Referred to radiation oncology for evaluation of radiation therapy options. - Referred to medical oncology for evaluation of chemotherapy and immune therapy options. - Referred to thoracic surgery for evaluation of surgical options. - Scheduled video follow-up visit to discuss PET scan and MRI results. - Provided information on support groups and resources for lung cancer patients.  I spent 40 minutes dedicated to the care of this patient on the date of this encounter to include pre-visit review of records, face-to-face time with the patient discussing conditions above, post visit ordering of testing, clinical documentation with the electronic health record, making appropriate referrals as documented, and communicating necessary information to the patient's healthcare team.      Lauraine JULIANNA Lites, NP 08/27/2024  11:07 AM

## 2024-08-27 NOTE — Patient Instructions (Addendum)
 It is good to see you today. I am glad you have done well after the procedure and biopsies.  We have reviewed your biopsy results. The biopsy of the left lower lobe target was consistent with adenocarcinoma which is a non-small cell lung cancer. The target of the right upper lobe was consistent with a non-small cell adenocarcinoma also. We need to complete staging with a PET scan, and an MRI of the brain. I have placed orders for both and you will get calls to get the schedule. I have asked that you be scheduled for a video visit after these staging scans have been completed so we can review the results. If you are scheduled to see oncology or have already seen oncology and these results have been reviewed with you it is fine to cancel that appointment. I have also ordered pulmonary function testing to determine if you are surgical candidate as an option for treatment. I have referred you to thoracic surgery, medical oncology, and radiation oncology consultations to help you understand all the different options for treatment. Based on staging all of these may not be an option however until staging is completed we will go ahead and get you scheduled to see these specialties. Please call if you have any questions or concerns. Good luck with your therapy and treatment, the lung cancer center will take wonderful care of you. Please contact office for sooner follow up if symptoms do not improve or worsen or seek emergency care

## 2024-08-30 ENCOUNTER — Other Ambulatory Visit: Payer: Self-pay

## 2024-08-30 NOTE — Progress Notes (Signed)
 The proposed treatment discussed in conference is for discussion purpose only and is not a binding recommendation.  The patients have not been physically examined, or presented with their treatment options.  Therefore, final treatment plans cannot be decided.

## 2024-09-02 ENCOUNTER — Encounter: Payer: Self-pay | Admitting: Cardiovascular Disease

## 2024-09-03 ENCOUNTER — Ambulatory Visit (INDEPENDENT_AMBULATORY_CARE_PROVIDER_SITE_OTHER)

## 2024-09-03 DIAGNOSIS — C3490 Malignant neoplasm of unspecified part of unspecified bronchus or lung: Secondary | ICD-10-CM

## 2024-09-03 LAB — PULMONARY FUNCTION TEST
DL/VA % pred: 106 %
DL/VA: 4.35 ml/min/mmHg/L
DLCO cor % pred: 104 %
DLCO cor: 21.65 ml/min/mmHg
DLCO unc % pred: 101 %
DLCO unc: 21.03 ml/min/mmHg
FEF 25-75 Post: 2.27 L/s
FEF 25-75 Pre: 1.44 L/s
FEF2575-%Change-Post: 57 %
FEF2575-%Pred-Post: 112 %
FEF2575-%Pred-Pre: 71 %
FEV1-%Change-Post: 12 %
FEV1-%Pred-Post: 87 %
FEV1-%Pred-Pre: 77 %
FEV1-Post: 2.16 L
FEV1-Pre: 1.92 L
FEV1FVC-%Change-Post: 3 %
FEV1FVC-%Pred-Pre: 97 %
FEV6-%Change-Post: 9 %
FEV6-%Pred-Post: 90 %
FEV6-%Pred-Pre: 82 %
FEV6-Post: 2.81 L
FEV6-Pre: 2.56 L
FEV6FVC-%Change-Post: 0 %
FEV6FVC-%Pred-Post: 104 %
FEV6FVC-%Pred-Pre: 103 %
FVC-%Change-Post: 8 %
FVC-%Pred-Post: 86 %
FVC-%Pred-Pre: 79 %
FVC-Post: 2.81 L
FVC-Pre: 2.58 L
Post FEV1/FVC ratio: 77 %
Post FEV6/FVC ratio: 100 %
Pre FEV1/FVC ratio: 74 %
Pre FEV6/FVC Ratio: 99 %
RV % pred: 156 %
RV: 3.59 L
TLC % pred: 118 %
TLC: 6.35 L

## 2024-09-03 NOTE — Progress Notes (Signed)
 Full PFT performed today.

## 2024-09-03 NOTE — Patient Instructions (Signed)
 Full PFT performed today.

## 2024-09-04 ENCOUNTER — Encounter (HOSPITAL_COMMUNITY)
Admission: RE | Admit: 2024-09-04 | Discharge: 2024-09-04 | Disposition: A | Discharge status: Home / Self Care | Source: Ambulatory Visit | Attending: Acute Care | Admitting: Acute Care

## 2024-09-04 ENCOUNTER — Ambulatory Visit (HOSPITAL_COMMUNITY)
Admission: RE | Admit: 2024-09-04 | Discharge: 2024-09-04 | Disposition: A | Discharge status: Home / Self Care | Source: Ambulatory Visit | Attending: Acute Care | Admitting: Acute Care

## 2024-09-04 DIAGNOSIS — C3490 Malignant neoplasm of unspecified part of unspecified bronchus or lung: Secondary | ICD-10-CM | POA: Diagnosis not present

## 2024-09-04 DIAGNOSIS — C3432 Malignant neoplasm of lower lobe, left bronchus or lung: Secondary | ICD-10-CM | POA: Diagnosis not present

## 2024-09-04 LAB — GLUCOSE, CAPILLARY: Glucose-Capillary: 87 mg/dL (ref 70–99)

## 2024-09-04 MED ORDER — GADOBUTROL 1 MMOL/ML IV SOLN
7.0000 mL | Freq: Once | INTRAVENOUS | Status: AC | PRN
  Administered 2024-09-04: 7 mL via INTRAVENOUS

## 2024-09-04 MED ORDER — FLUDEOXYGLUCOSE F - 18 (FDG) INJECTION
8.7600 | Freq: Once | INTRAVENOUS | Status: AC | PRN
  Administered 2024-09-04: 8.76 via INTRAVENOUS

## 2024-09-04 NOTE — Progress Notes (Signed)
 Radiation Oncology         [phone] ________________________________  Initial {In/Out}patient Consultation  Name: [name] MRN: [id number]  Date: 09/06/2024  DOB: 1956/09/24  RR:Tyjmunw, Charmaine, PA-C  Shelah Lamar RAMAN, MD   REFERRING PHYSICIAN: Shelah Lamar RAMAN, MD  DIAGNOSIS: There were no encounter diagnoses.  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 female who is accompanied by ***. she is seen as a courtesy of Dr. Byrum for an opinion concerning radiation therapy as part of management for her recently diagnosed lung cancer. Patient's medical history is significant for renal cell carcinoma with right nephrectomy in 2020. She has a distant smoking history having quit on 2007.   Patient undergoes regular surveillance imaging, during a CT chest on 06/22/24 which showed multiple bilateral pulmonary nodules some solid and some ground glass and a 1.6 x 1.2 cm mixed density nodule in the superior segment of the left lower lobe that is slightly larger and more conspicuous compared with 2018. The solid component is 6 mm. Scan also noted a new ground glass nodule in the inferior right upper lobe 10 x 8 mm.  Subsequently, he was referred to Dr. Shelah who presented the patient with two options, a repeat CT scan in 3 months or a biopsy. Patient opted to proceed with a video bronchoscopy with biopsies which she underwent on 08/21/2024. Surgical pathology of LLL and RUL FNA indicated non-small cell carcinoma consistent with adenocarcinoma. Distal tracheal brushing was negative for malignant cells.   Her family history is significant for lung cancer, with both her mother and sister having died from the disease, specifically small cell lung cancer. Her sister was a previous smoker, and her mother had smoked in the past.   Patient underwent brain MRI and PET scan on 11/11, results  pending.  PREVIOUS RADIATION THERAPY: {EXAM; YES/NO:19492::No}  PAST MEDICAL HISTORY:  Past Medical History:  Diagnosis Date   Diverticulitis, colon    GERD (gastroesophageal reflux disease)    H/O colonoscopy 07/19/2022   Hypertension    Mild dilation of ascending aorta    Pulmonary nodules    seen on pre TAVR CT, needs follow up    Renal cancer [facility]) 2007   Surgically removed from right kidney   S/P TAVR (transcatheter aortic valve replacement)    s/p TAVR w/ a Edwards Sapien 3 Ultra THV via the TF approach on 11/13/19 with Dr. Wonda and Lucas.    Severe aortic stenosis    Stroke (cerebrum) (HCC)    a. MRI of brain 12/2021 [facility] showed punctate subacute versus chronic white matter lacunar infarct in the posterior left corona radiata/centrum semiovale.    PAST SURGICAL HISTORY: Past Surgical History:  Procedure Laterality Date   APPENDECTOMY     COLON SURGERY     HERNIA REPAIR     OVARIAN CYST SURGERY     RIGHT/LEFT HEART CATH AND CORONARY ANGIOGRAPHY N/A 08/22/2019   Procedure: RIGHT/LEFT HEART CATH AND CORONARY ANGIOGRAPHY;  Surgeon: Claudene Victory LELON, MD;  Location: [facility] INVASIVE CV LAB;  Service: Cardiovascular;  Laterality: N/A;   TEE WITHOUT CARDIOVERSION N/A 11/13/2019   Procedure: TRANSESOPHAGEAL ECHOCARDIOGRAM (TEE);  Surgeon: Wonda Sharper, MD;  Location: [facility] INVASIVE CV LAB;  Service: Open Heart Surgery;  Laterality: N/A;   TRANSCATHETER AORTIC VALVE REPLACEMENT, TRANSFEMORAL N/A 11/13/2019   Procedure: TRANSCATHETER AORTIC VALVE REPLACEMENT, TRANSFEMORAL;  Surgeon: Wonda Sharper, MD;  Location: [facility] INVASIVE CV LAB;  Service: Open Heart Surgery;  Laterality: N/A;   VIDEO BRONCHOSCOPY WITH ENDOBRONCHIAL NAVIGATION Bilateral 08/21/2024   Procedure: VIDEO BRONCHOSCOPY WITH ENDOBRONCHIAL NAVIGATION;  Surgeon: Shelah Lamar RAMAN, MD;  Location: [facility] ENDOSCOPY;  Service: Pulmonary;  Laterality: Bilateral;    FAMILY HISTORY:  Family History  Problem Relation Age of Onset   Breast  cancer Mother    Cancer Mother        Lung   Cancer Father        Lung   Hypertension Father    Cancer Sister        Lung   Heart attack Paternal Aunt    Heart attack Paternal Grandfather     SOCIAL HISTORY:  Social History   Tobacco Use   Smoking status: Former    Current packs/day: 0.00    Average packs/day: 1 pack/day for 10.0 years (10.0 ttl pk-yrs)    Types: Cigarettes    Start date: 10/26/1995    Quit date: 10/25/2005    Years since quitting: 18.8   Smokeless tobacco: Never   Tobacco comments:    quit around 2007   Vaping Use   Vaping status: Never Used  Substance Use Topics   Alcohol use: Yes    Comment: occ   Drug use: No    ALLERGIES:  Allergies  Allergen Reactions   Diphenhydramine Hcl     loopy   Protonix  [Pantoprazole ] Nausea And Vomiting    Nausea, headache, heightened senses.    Ciprofloxacin Itching   Codeine Itching   Diphenhydramine Other (See Comments)   Morphine And Codeine Nausea Only    Anxiety and Nausea     MEDICATIONS:  Current Outpatient Medications  Medication Sig Dispense Refill   aspirin  EC 81 MG tablet Take 1 tablet (81 mg total) by mouth daily. Swallow whole. 90 tablet 3   atorvastatin  (LIPITOR) 40 MG tablet TAKE 1 TABLET BY MOUTH DAILY 90 tablet 2   cetirizine (ZYRTEC) 10 MG tablet Take 10 mg by mouth daily.     fluticasone  (FLONASE ) 50 MCG/ACT nasal spray Place 1 spray into both nostrils as needed for allergies or rhinitis.     ibuprofen (ADVIL) 200 MG tablet Take 200 mg by mouth every 4 (four) hours as needed.     magnesium  oxide (MAG-OX) 400 MG tablet Take 1 tablet (400 mg total) by mouth daily. 30 tablet 11   metoprolol  succinate (TOPROL -XL) 50 MG 24 hr tablet TAKE 1 TABLET BY MOUTH DAILY WITH OR IMMEDIATELY FOLLOWING A MEAL (Patient taking differently: Take 50 mg by mouth daily.) 90 tablet 2   omeprazole  (PRILOSEC) 40 MG capsule TAKE 1 CAPSULE BY MOUTH DAILY 90 capsule 3   OVER THE COUNTER MEDICATION Take 1 tablet by mouth  daily at 6 (six) AM. Trans pterostilbene     No current facility-administered medications for this visit.    REVIEW OF SYSTEMS:  A 10+ POINT REVIEW OF SYSTEMS WAS OBTAINED including neurology, dermatology, psychiatry, cardiac, respiratory, lymph, extremities, GI, GU, musculoskeletal, constitutional, reproductive, HEENT. ***   PHYSICAL EXAM:  vitals were not taken for this visit.   General: Alert and oriented, in no acute distress HEENT: Head is normocephalic. Extraocular movements are intact. Oropharynx is clear. Neck: Neck is supple, no palpable cervical or supraclavicular lymphadenopathy. Heart: Regular in rate and rhythm with no murmurs, rubs, or gallops. Chest: Clear to auscultation bilaterally, with no rhonchi, wheezes, or rales. Abdomen: Soft, nontender, nondistended, with no rigidity or guarding. Extremities:  No cyanosis or edema. Lymphatics: see Neck Exam Skin: No concerning lesions. Musculoskeletal: symmetric strength and muscle tone throughout. Neurologic: Cranial nerves II through XII are grossly intact. No obvious focalities. Speech is fluent. Coordination is intact. Psychiatric: Judgment and insight are intact. Affect is appropriate. ***  ECOG = ***  0 - Asymptomatic (Fully active, able to carry on all predisease activities without restriction)  1 - Symptomatic but completely ambulatory (Restricted in physically strenuous activity but ambulatory and able to carry out work of a light or sedentary nature. For example, light housework, office work)  2 - Symptomatic, <50% in bed during the day (Ambulatory and capable of all self care but unable to carry out any work activities. Up and about more than 50% of waking hours)  3 - Symptomatic, >50% in bed, but not bedbound (Capable of only limited self-care, confined to bed or chair 50% or more of waking hours)  4 - Bedbound (Completely disabled. Cannot carry on any self-care. Totally confined to bed or chair)  5 - Death    Raylene MM, Creech RH, Tormey DC, et al. [phone]). Toxicity and response criteria of the [facility]. Am. DOROTHA Bridges. Oncol. 5 (6): 649-55  LABORATORY DATA:  Lab Results  Component Value Date   WBC 7.5 08/21/2024   HGB 12.5 08/21/2024   HCT 39.7 08/21/2024   MCV 86.9 08/21/2024   PLT 265 08/21/2024   NEUTROABS 5.4 04/21/2022   Lab Results  Component Value Date   NA 137 08/21/2024   K 4.0 08/21/2024   CL 99 08/21/2024   CO2 25 08/21/2024   GLUCOSE 100 (H) 08/21/2024   BUN 14 08/21/2024   CREATININE 0.60 08/21/2024   CALCIUM  9.2 08/21/2024      RADIOGRAPHY: DG Chest Port 1 View Result Date: 08/21/2024 EXAM: 1 VIEW XRAY OF THE CHEST 08/21/2024 11:31:00 AM COMPARISON: None available. CLINICAL HISTORY: S/P bronchoscopy with biopsy [id number]. S/P bronchoscopy FINDINGS: LINES, TUBES AND DEVICES: Cardiac monitoring leads are noted. LUNGS AND PLEURA: Left basilar opacity is present. A possible small left pleural effusion is noted. HEART AND MEDIASTINUM: TAVR is noted. Aortic atherosclerosis is present. BONES AND SOFT TISSUES: No acute osseous abnormality. IMPRESSION: 1. Left basilar opacity, which may be due to infiltrate or atelectasis. 2. Possible small left pleural effusion. Electronically signed by: Norleen Kil MD 08/21/2024 11:52 AM EDT RP Workstation: [id number]   DG C-ARM BRONCHOSCOPY Result Date: 08/21/2024 C-ARM BRONCHOSCOPY: Fluoroscopy was utilized by the requesting physician.  No radiographic interpretation.   DG C-Arm 1-60 Min-No Report Result Date: 08/21/2024 Fluoroscopy was utilized by the requesting physician.  No radiographic interpretation.   ECHOCARDIOGRAM COMPLETE Result Date: 08/16/2024    ECHOCARDIOGRAM REPORT   Patient Name:   [name] Date of Exam: 08/16/2024 Medical Rec #:  [id number]        Height:       66.0 in Accession #:    [id number]       Weight:       177.0 lb Date of Birth:  [birth year]        BSA:          1.899 m Patient Age:    68  years         BP:           121/82 mmHg Patient Gender: F                HR:  75 bpm. Exam Location:  [address] Procedure: 2D Echo, 3D Echo, Cardiac Doppler, Color Doppler and Strain Analysis            (Both Spectral and Color Flow Doppler were utilized during            procedure). Indications:    Z95.2 TAVR  History:        Patient has prior history of Echocardiogram examinations, most                 recent 08/16/2023. Renal CA and Stroke; Risk                 Factors:Hypertension and Former Smoker. Previous echo LVEF 60%                 TAVR mean gradient 28 mmHg. PAP 26 mmHg.                 Aortic Valve: 23 mm Sapien prosthetic, stented (TAVR) valve is                 present in the aortic position. Procedure Date: 11/13/2019.  Sonographer:    Nolon Berg BA, RDCS Referring Phys: [id number] MAUDE JAYSON EMMER IMPRESSIONS  1. Left ventricular ejection fraction, by estimation, is 65 to 70%. The left ventricle has normal function. The left ventricle has no regional wall motion abnormalities. There is mild left ventricular hypertrophy. The average left ventricular global longitudinal strain is -18.7 %. The global longitudinal strain is normal.  2. Right ventricular systolic function is normal. The right ventricular size is normal.  3. The mitral valve is normal in structure. Trivial mitral valve regurgitation.  4. S/p TAVR (23 mm Sapien prosthesis; procedure date 11/13/2019) Peak nad mena gradients through the valve are 34 and 22 mm Hg respectively Dimensionless valve index is 0.4 OVerall relatively unchanged from echo done in October 2024. The aortic valve has  been repaired/replaced. Aortic valve regurgitation is not visualized. There is a 23 mm Sapien prosthetic (TAVR) valve present in the aortic position. Procedure Date: 11/13/2019. FINDINGS  Left Ventricle: Left ventricular ejection fraction, by estimation, is 65 to 70%. The left ventricle has normal function. The left ventricle has no regional wall  motion abnormalities. The average left ventricular global longitudinal strain is -18.7 %. Strain was performed and the global longitudinal strain is normal. The left ventricular internal cavity size was normal in size. There is mild left ventricular hypertrophy. Right Ventricle: The right ventricular size is normal. Right vetricular wall thickness was not assessed. Right ventricular systolic function is normal. Left Atrium: Left atrial size was normal in size. Right Atrium: Right atrial size was normal in size. Pericardium: Trivial pericardial effusion is present. Mitral Valve: The mitral valve is normal in structure. Trivial mitral valve regurgitation. Tricuspid Valve: The tricuspid valve is normal in structure. Tricuspid valve regurgitation is trivial. Aortic Valve: S/p TAVR (23 mm Sapien prosthesis; procedure date 11/13/2019) Peak nad mena gradients through the valve are 34 and 22 mm Hg respectively Dimensionless valve index is 0.4 OVerall relatively unchanged from echo done in October 2024. The aortic  valve has been repaired/replaced. Aortic valve regurgitation is not visualized. Aortic valve mean gradient measures 22.0 mmHg. Aortic valve peak gradient measures 34.3 mmHg. Aortic valve area, by VTI measures 2.11 cm. There is a 23 mm Sapien prosthetic, stented (TAVR) valve present in the aortic position. Procedure Date: 11/13/2019. Pulmonic Valve: The pulmonic valve was normal in structure. Pulmonic valve regurgitation is mild.  Aorta: The aortic root and ascending aorta are structurally normal, with no evidence of dilitation. IAS/Shunts: No atrial level shunt detected by color flow Doppler.  LEFT VENTRICLE PLAX 2D LVIDd:         4.20 cm   Diastology LVIDs:         2.25 cm   LV e' medial:    5.17 cm/s LV PW:         1.10 cm   LV E/e' medial:  18.0 LV IVS:        1.20 cm   LV e' lateral:   4.79 cm/s LVOT diam:     2.60 cm   LV E/e' lateral: 19.5 LV SV:         124 LV SV Index:   65        2D Longitudinal Strain LVOT  Area:     5.31 cm  2D Strain GLS (A4C):   -18.5 %                          2D Strain GLS (A3C):   -13.9 %                          2D Strain GLS (A2C):   -23.8 %                          2D Strain GLS Avg:     -18.7 %                           3D Volume EF:                          3D EF:        60 %                          LV EDV:       88 ml                          LV ESV:       36 ml                          LV SV:        53 ml RIGHT VENTRICLE             IVC RV Basal diam:  3.50 cm     IVC diam: 1.90 cm RV Mid diam:    3.30 cm RV S prime:     16.50 cm/s  PULMONARY VEINS TAPSE (M-mode): 2.5 cm      A Reversal Velocity: 35.00 cm/s RVSP:           26.4 mmHg   Diastolic Velocity:  40.40 cm/s                             S/D Velocity:        1.40                             Systolic Velocity:   57.10 cm/s LEFT ATRIUM  Index        RIGHT ATRIUM           Index LA diam:        4.80 cm 2.53 cm/m   RA Pressure: 3.00 mmHg LA Vol (A2C):   32.4 ml 17.06 ml/m  RA Area:     15.30 cm LA Vol (A4C):   40.4 ml 21.28 ml/m  RA Volume:   42.60 ml  22.44 ml/m LA Biplane Vol: 36.4 ml 19.17 ml/m  AORTIC VALVE AV Area (Vmax):    2.32 cm AV Area (Vmean):   2.28 cm AV Area (VTI):     2.11 cm AV Vmax:           293.00 cm/s AV Vmean:          204.200 cm/s AV VTI:            0.587 m AV Peak Grad:      34.3 mmHg AV Mean Grad:      22.0 mmHg LVOT Vmax:         128.00 cm/s LVOT Vmean:        87.500 cm/s LVOT VTI:          0.233 m LVOT/AV VTI ratio: 0.40  AORTA Ao Root diam: 3.00 cm Ao Asc diam:  3.80 cm MITRAL VALVE                TRICUSPID VALVE MV Area (PHT): 4.39 cm     TR Peak grad:   23.4 mmHg MV Decel Time: 173 msec     TR Vmax:        242.00 cm/s MV E velocity: 93.30 cm/s   Estimated RAP:  3.00 mmHg MV A velocity: 108.00 cm/s  RVSP:           26.4 mmHg MV E/A ratio:  0.86                             SHUNTS                             Systemic VTI:  0.23 m                             Systemic Diam: 2.60 cm Vina Gull MD Electronically signed by Vina Gull MD Signature Date/Time: 08/16/2024/6:09:29 PM    Final       IMPRESSION: 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  We discussed the natural history of *** and general treatment, highlighting the role of radiotherapy in the management.  We discussed the available radiation techniques, and focused on the details of logistics and delivery.  We reviewed the anticipated acute and late sequelae associated with radiation in this setting.  The patient was encouraged to ask questions that I answered to the best of my ability. *** A patient consent form was discussed and signed.  We retained a copy for our records.  The patient would like to proceed with radiation and will be scheduled for CT simulation.  PLAN: ***    *** minutes of total time was spent for this patient encounter, including preparation, face-to-face counseling with the patient and coordination of care, physical exam, and  documentation of the encounter.   ------------------------------------------------  Lynwood CHARM Nasuti, PhD, MD  This document serves as a record of services personally performed by Lynwood Nasuti, MD. It was created on his behalf by Reymundo Cartwright, a trained medical scribe. The creation of this record is based on the scribe's personal observations and the provider's statements to them. This document has been checked and approved by the attending provider.

## 2024-09-05 NOTE — Progress Notes (Signed)
 Location of tumor and Histology per Pathology Report:  Left lower lobe and right upper lobe    Biopsy:     Past/Anticipated interventions by surgeon, if any:    Past/Anticipated interventions by medical oncology, if any:  Patient to see Dr. Gatha on 09/12/24    Pain issues, if any:  no    SAFETY ISSUES: Prior radiation? no Pacemaker/ICD? no Possible current pregnancy? no Is the patient on methotrexate? no  Current Complaints / other details:      BP 136/72 (BP Location: Left Arm, Patient Position: Sitting)   Pulse 94   Temp (!) 96.8 F (36 C) (Temporal)   Resp 18   Ht 5' 6 (1.676 m)   Wt 174 lb 8 oz (79.2 kg)   SpO2 98%   BMI 28.17 kg/m

## 2024-09-06 ENCOUNTER — Encounter: Payer: Self-pay | Admitting: Radiation Oncology

## 2024-09-06 ENCOUNTER — Ambulatory Visit
Admission: RE | Admit: 2024-09-06 | Discharge: 2024-09-06 | Disposition: A | Discharge status: Home / Self Care | Source: Ambulatory Visit | Attending: Radiation Oncology | Admitting: Radiation Oncology

## 2024-09-06 VITALS — BP 136/72 | HR 94 | Temp 96.8°F | Resp 18 | Ht 66.0 in | Wt 174.5 lb

## 2024-09-06 DIAGNOSIS — Z7982 Long term (current) use of aspirin: Secondary | ICD-10-CM | POA: Insufficient documentation

## 2024-09-06 DIAGNOSIS — I10 Essential (primary) hypertension: Secondary | ICD-10-CM | POA: Diagnosis not present

## 2024-09-06 DIAGNOSIS — C3432 Malignant neoplasm of lower lobe, left bronchus or lung: Secondary | ICD-10-CM

## 2024-09-06 DIAGNOSIS — Z8673 Personal history of transient ischemic attack (TIA), and cerebral infarction without residual deficits: Secondary | ICD-10-CM | POA: Insufficient documentation

## 2024-09-06 DIAGNOSIS — Z85528 Personal history of other malignant neoplasm of kidney: Secondary | ICD-10-CM | POA: Diagnosis not present

## 2024-09-06 DIAGNOSIS — C3411 Malignant neoplasm of upper lobe, right bronchus or lung: Secondary | ICD-10-CM

## 2024-09-06 DIAGNOSIS — Z79899 Other long term (current) drug therapy: Secondary | ICD-10-CM | POA: Insufficient documentation

## 2024-09-06 DIAGNOSIS — I350 Nonrheumatic aortic (valve) stenosis: Secondary | ICD-10-CM | POA: Insufficient documentation

## 2024-09-06 DIAGNOSIS — K219 Gastro-esophageal reflux disease without esophagitis: Secondary | ICD-10-CM | POA: Insufficient documentation

## 2024-09-06 DIAGNOSIS — Z803 Family history of malignant neoplasm of breast: Secondary | ICD-10-CM | POA: Insufficient documentation

## 2024-09-06 DIAGNOSIS — Z801 Family history of malignant neoplasm of trachea, bronchus and lung: Secondary | ICD-10-CM | POA: Insufficient documentation

## 2024-09-06 DIAGNOSIS — Z87891 Personal history of nicotine dependence: Secondary | ICD-10-CM | POA: Diagnosis not present

## 2024-09-06 DIAGNOSIS — R918 Other nonspecific abnormal finding of lung field: Secondary | ICD-10-CM

## 2024-09-11 ENCOUNTER — Other Ambulatory Visit: Payer: Self-pay | Admitting: *Deleted

## 2024-09-11 DIAGNOSIS — R918 Other nonspecific abnormal finding of lung field: Secondary | ICD-10-CM

## 2024-09-12 ENCOUNTER — Inpatient Hospital Stay: Attending: Internal Medicine | Admitting: Internal Medicine

## 2024-09-12 ENCOUNTER — Inpatient Hospital Stay (HOSPITAL_BASED_OUTPATIENT_CLINIC_OR_DEPARTMENT_OTHER)

## 2024-09-12 VITALS — BP 129/69 | HR 95 | Temp 98.2°F | Resp 17 | Ht 66.0 in | Wt 172.0 lb

## 2024-09-12 DIAGNOSIS — C3432 Malignant neoplasm of lower lobe, left bronchus or lung: Secondary | ICD-10-CM

## 2024-09-12 DIAGNOSIS — R918 Other nonspecific abnormal finding of lung field: Secondary | ICD-10-CM

## 2024-09-12 DIAGNOSIS — C3411 Malignant neoplasm of upper lobe, right bronchus or lung: Secondary | ICD-10-CM

## 2024-09-12 DIAGNOSIS — Z801 Family history of malignant neoplasm of trachea, bronchus and lung: Secondary | ICD-10-CM | POA: Diagnosis not present

## 2024-09-12 LAB — CBC WITH DIFFERENTIAL (CANCER CENTER ONLY)
Abs Immature Granulocytes: 0.02 K/uL (ref 0.00–0.07)
Basophils Absolute: 0.1 K/uL (ref 0.0–0.1)
Basophils Relative: 2 %
Eosinophils Absolute: 0.5 K/uL (ref 0.0–0.5)
Eosinophils Relative: 7 %
HCT: 37.4 % (ref 36.0–46.0)
Hemoglobin: 12 g/dL (ref 12.0–15.0)
Immature Granulocytes: 0 %
Lymphocytes Relative: 27 %
Lymphs Abs: 2.1 K/uL (ref 0.7–4.0)
MCH: 27.3 pg (ref 26.0–34.0)
MCHC: 32.1 g/dL (ref 30.0–36.0)
MCV: 85.2 fL (ref 80.0–100.0)
Monocytes Absolute: 0.6 K/uL (ref 0.1–1.0)
Monocytes Relative: 7 %
Neutro Abs: 4.5 K/uL (ref 1.7–7.7)
Neutrophils Relative %: 57 %
Platelet Count: 275 K/uL (ref 150–400)
RBC: 4.39 MIL/uL (ref 3.87–5.11)
RDW: 13.4 % (ref 11.5–15.5)
WBC Count: 7.8 K/uL (ref 4.0–10.5)
nRBC: 0 % (ref 0.0–0.2)

## 2024-09-12 LAB — CMP (CANCER CENTER ONLY)
ALT: 16 U/L (ref 0–44)
AST: 23 U/L (ref 15–41)
Albumin: 4.5 g/dL (ref 3.5–5.0)
Alkaline Phosphatase: 113 U/L (ref 38–126)
Anion gap: 9 (ref 5–15)
BUN: 14 mg/dL (ref 8–23)
CO2: 30 mmol/L (ref 22–32)
Calcium: 10 mg/dL (ref 8.9–10.3)
Chloride: 100 mmol/L (ref 98–111)
Creatinine: 0.64 mg/dL (ref 0.44–1.00)
GFR, Estimated: >60 mL/min (ref >60)
Glucose, Bld: 97 mg/dL (ref 70–99)
Potassium: 4.2 mmol/L (ref 3.5–5.1)
Sodium: 139 mmol/L (ref 135–145)
Total Bilirubin: 0.4 mg/dL (ref 0.0–1.2)
Total Protein: 8 g/dL (ref 6.5–8.1)

## 2024-09-12 NOTE — Progress Notes (Signed)
 [facility] Telephone:([phone]) [phone]   Fax:(336) [phone]  CONSULT NOTE  REFERRING PHYSICIAN: Dr. Lamar Chris  REASON FOR CONSULTATION:  68 years old white female recently diagnosed with lung cancer  HPI [name] is a 68 y.o. female.   HPI  Discussed the use of AI scribe software for clinical note transcription with the patient, who gave verbal consent to proceed.  History of Present Illness [name] is a 68 year old female with lung cancer who presents for evaluation of recently diagnosed lung cancer. She is accompanied by her husband, [name].  She was recently diagnosed with lung cancer through a CT lung screening program. A CT scan on June 22, 2024, revealed a mixed solid and ground glass nodule in the superior segment of the left lower lobe measuring 1.6 by 1.2 cm, and a ground glass nodule in the right upper lobe. A bronchoscopy with biopsy on August 21, 2024, confirmed adenocarcinoma in both nodules. A recent MRI of the brain showed no metastasis, and a PET scan indicated activity in the left nodule but not in the right.  No symptoms such as chest pain, breathing issues, coughing, weight loss, night sweats, headaches, nausea, or vomiting.  Her past medical history includes high blood pressure, acid reflux, diverticulosis, kidney cancer in 2007 treated with partial nephrectomy, a stroke in March 2023, and severe aortic stenosis treated with an artificial heart valve. She denies diabetes, heart attacks, and high cholesterol.  Her family history is significant for lung cancer in her mother, father, and sister.  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 She quit smoking 20 years ago after smoking for approximately 20 years. She does not consume alcohol regularly and denies use of street drugs.  She is allergic to certain medications, though the specific allergies were not recalled during the conversation.     Past Medical  History:  Diagnosis Date   Diverticulitis, colon    GERD (gastroesophageal reflux disease)    H/O colonoscopy 07/19/2022   Hypertension    Mild dilation of ascending aorta    Pulmonary nodules    seen on pre TAVR CT, needs follow up    Renal cancer [facility]) 2007   Surgically removed from right kidney   S/P TAVR (transcatheter aortic valve replacement)    s/p TAVR w/ a Edwards Sapien 3 Ultra THV via the TF approach on 11/13/19 with Dr. Wonda and Lucas.    Severe aortic stenosis    Stroke (cerebrum) (HCC)    a. MRI of brain 12/2021 RI showed punctate subacute versus chronic white matter lacunar infarct in the posterior left corona radiata/centrum semiovale.      Past Surgical History:  Procedure Laterality Date   APPENDECTOMY     COLON SURGERY     HERNIA REPAIR     OVARIAN CYST SURGERY     RIGHT/LEFT HEART CATH AND CORONARY ANGIOGRAPHY N/A 08/22/2019   Procedure: RIGHT/LEFT HEART CATH AND CORONARY ANGIOGRAPHY;  Surgeon: Claudene Victory LELON, MD;  Location: [facility] INVASIVE CV LAB;  Service: Cardiovascular;  Laterality: N/A;   TEE WITHOUT CARDIOVERSION N/A 11/13/2019   Procedure: TRANSESOPHAGEAL ECHOCARDIOGRAM (TEE);  Surgeon: Wonda Sharper, MD;  Location: [facility] INVASIVE CV LAB;  Service: Open Heart Surgery;  Laterality: N/A;   TRANSCATHETER AORTIC VALVE REPLACEMENT, TRANSFEMORAL N/A 11/13/2019   Procedure: TRANSCATHETER AORTIC VALVE REPLACEMENT, TRANSFEMORAL;  Surgeon: Wonda Sharper, MD;  Location: [facility] INVASIVE CV LAB;  Service: Open Heart Surgery;  Laterality:  N/A;   VIDEO BRONCHOSCOPY WITH ENDOBRONCHIAL NAVIGATION Bilateral 08/21/2024   Procedure: VIDEO BRONCHOSCOPY WITH ENDOBRONCHIAL NAVIGATION;  Surgeon: Shelah Lamar RAMAN, MD;  Location: [facility] ENDOSCOPY;  Service: Pulmonary;  Laterality: Bilateral;    Family History  Problem Relation Age of Onset   Breast cancer Mother    Cancer Mother        Lung   Cancer Father        Lung   Hypertension Father    Cancer Sister        Lung   Heart attack  Paternal Aunt    Heart attack Paternal Grandfather     Social History Social History   Tobacco Use   Smoking status: Former    Current packs/day: 0.00    Average packs/day: 1 pack/day for 10.0 years (10.0 ttl pk-yrs)    Types: Cigarettes    Start date: 10/26/1995    Quit date: 10/25/2005    Years since quitting: 18.8   Smokeless tobacco: Never   Tobacco comments:    quit around 2007   Vaping Use   Vaping status: Never Used  Substance Use Topics   Alcohol use: Yes    Comment: occ   Drug use: No    Allergies  Allergen Reactions   Diphenhydramine Hcl     loopy   Protonix  [Pantoprazole ] Nausea And Vomiting    Nausea, headache, heightened senses.    Ciprofloxacin Itching   Codeine Itching   Diphenhydramine Other (See Comments)   Morphine And Codeine Nausea Only    Anxiety and Nausea     Current Outpatient Medications  Medication Sig Dispense Refill   aspirin  EC 81 MG tablet Take 1 tablet (81 mg total) by mouth daily. Swallow whole. 90 tablet 3   atorvastatin  (LIPITOR) 40 MG tablet TAKE 1 TABLET BY MOUTH DAILY 90 tablet 2   cetirizine (ZYRTEC) 10 MG tablet Take 10 mg by mouth daily.     fluticasone  (FLONASE ) 50 MCG/ACT nasal spray Place 1 spray into both nostrils as needed for allergies or rhinitis.     ibuprofen (ADVIL) 200 MG tablet Take 200 mg by mouth every 4 (four) hours as needed.     magnesium  oxide (MAG-OX) 400 MG tablet Take 1 tablet (400 mg total) by mouth daily. 30 tablet 11   metoprolol  succinate (TOPROL -XL) 50 MG 24 hr tablet TAKE 1 TABLET BY MOUTH DAILY WITH OR IMMEDIATELY FOLLOWING A MEAL (Patient taking differently: Take 50 mg by mouth daily.) 90 tablet 2   omeprazole  (PRILOSEC) 40 MG capsule TAKE 1 CAPSULE BY MOUTH DAILY 90 capsule 3   OVER THE COUNTER MEDICATION Take 1 tablet by mouth daily at 6 (six) AM. Trans pterostilbene     No current facility-administered medications for this visit.    Review of Systems  Constitutional: negative Eyes:  negative Ears, nose, mouth, throat, and face: negative Respiratory: negative Cardiovascular: negative Gastrointestinal: negative Genitourinary:negative Integument/breast: negative Hematologic/lymphatic: negative Musculoskeletal:negative Neurological: negative Behavioral/Psych: negative Endocrine: negative Allergic/Immunologic: negative  Physical Exam  MJO:jozmu, healthy, no distress, well nourished, well developed, and anxious SKIN: skin color, texture, turgor are normal, no rashes or significant lesions HEAD: Normocephalic, No masses, lesions, tenderness or abnormalities EYES: normal, PERRLA, Conjunctiva are pink and non-injected EARS: External ears normal, Canals clear OROPHARYNX:no exudate, no erythema, and lips, buccal mucosa, and tongue normal  NECK: supple, no adenopathy, no JVD LYMPH:  no palpable lymphadenopathy, no hepatosplenomegaly BREAST:not examined LUNGS: clear to auscultation , and palpation HEART: regular rate & rhythm, no  murmurs, and no gallops ABDOMEN:abdomen soft, non-tender, normal bowel sounds, and no masses or organomegaly BACK: Back symmetric, no curvature., No CVA tenderness EXTREMITIES:no joint deformities, effusion, or inflammation, no edema  NEURO: alert & oriented x 3 with fluent speech, no focal motor/sensory deficits  PERFORMANCE STATUS: ECOG 1  LABORATORY DATA: Lab Results  Component Value Date   WBC 7.8 09/12/2024   HGB 12.0 09/12/2024   HCT 37.4 09/12/2024   MCV 85.2 09/12/2024   PLT 275 09/12/2024      Chemistry      Component Value Date/Time   NA 139 09/12/2024 1333   NA 141 04/06/2024 1122   K 4.2 09/12/2024 1333   CL 100 09/12/2024 1333   CO2 30 09/12/2024 1333   BUN 14 09/12/2024 1333   BUN 14 04/06/2024 1122   CREATININE 0.64 09/12/2024 1333      Component Value Date/Time   CALCIUM  10.0 09/12/2024 1333   ALKPHOS 113 09/12/2024 1333   AST 23 09/12/2024 1333   ALT 16 09/12/2024 1333   BILITOT 0.4 09/12/2024 1333        RADIOGRAPHIC STUDIES: MR BRAIN W WO CONTRAST Result Date: 09/05/2024 EXAM: MRI BRAIN WITH AND WITHOUT CONTRAST 09/04/2024 04:37:25 PM TECHNIQUE: Multiplanar multisequence MRI of the head/brain was performed with and without the administration of intravenous contrast. COMPARISON: MR Head 12/25/2021. CLINICAL HISTORY: Non-small cell lung cancer (NSCLC), staging, malignant neoplasm of unspecified part of unspecified bronchus or lung. FINDINGS: BRAIN AND VENTRICLES: No acute infarct. No acute intracranial hemorrhage. No mass effect or midline shift. No hydrocephalus. The sella is unremarkable. Normal flow voids. No mass or abnormal enhancement. There is mild subcortical and periventricular T2 and FLAIR signal hyperintensity likely reflecting sequelae of chronic microvascular ischemia. ORBITS: No acute abnormality. SINUSES: Leftward nasal septal deviation. BONES AND SOFT TISSUES: Normal bone marrow signal and enhancement. No acute soft tissue abnormality. IMPRESSION: 1. No abnormal intracranial enhancement to suggest intracranial metastatic disease. Electronically signed by: prentice bybordi 09/05/2024 12:30 PM EST RP Workstation: [id number]   NM PET Image Initial (PI) Skull Base To Thigh Result Date: 09/05/2024 EXAM: PET AND CT SKULL BASE TO MID THIGH 09/04/2024 06:00:40 PM TECHNIQUE: RADIOPHARMACEUTICAL: 8.76 mCi F-18 FDG Uptake time 60 minutes. Glucose level 87 mg/dl. PET imaging was acquired from the base of the skull to the mid thighs. Non-contrast enhanced computed tomography was obtained for attenuation correction and anatomic localization. COMPARISON: CT 06/22/2024 CLINICAL HISTORY: Non-small cell lung cancer (NSCLC), metastatic, assess treatment response. FINDINGS: HEAD AND NECK: No metabolically active cervical lymphadenopathy. CHEST: Ill defined nodule in the superior aspect of the left lower lobe measures 13 mm on image 71. This nodule has moderate hypermetabolic activity. There is a biopsy clip  adjacent to the nodule. SUV max equal 3.2. Hypermetabolic nodule in the superior aspect of the left lobe is concerning for bronchial carcinoma. Biopsy clip in the right upper lobe on image 81 is adjacent to a subsolid nodule without metabolic activity. No metabolic activity associated with subsolid nodule in the right upper lobe. Indeterminate finding. No hypermetabolic activity mediastinal lymph nodes. No evidence of metastatic mediastinal adenopathy. ABDOMEN AND PELVIS: CT findings surgical anastomosis of the sigmoid colon. Hernia mesh along the ventral abdominal wall. No metabolically active lymphadenopathy. Physiologic activity within the gastrointestinal and genitourinary systems. BONES AND SOFT TISSUE: Multiple hemangiomas within the THORACIC SPINE. No abnormal FDG activity localizes to the bones. No distant metastatic disease. IMPRESSION: 1. Ill-defined 13 mm nodule in the superior aspect of the left lower  lobe with moderate hypermetabolic activity (SUV max 3.2), concerning for bronchogenic carcinoma. 2. Subsolid right upper lobe nodule without metabolic activity. Indeterminate finding. 3. No hypermetabolic mediastinal adenopathy. 4. No distant hypermetabolic metastatic disease. Electronically signed by: Norleen Boxer MD 09/05/2024 11:46 AM EST RP Workstation: [id number]   DG Chest Port 1 View Result Date: 08/21/2024 EXAM: 1 VIEW XRAY OF THE CHEST 08/21/2024 11:31:00 AM COMPARISON: None available. CLINICAL HISTORY: S/P bronchoscopy with biopsy [id number]. S/P bronchoscopy FINDINGS: LINES, TUBES AND DEVICES: Cardiac monitoring leads are noted. LUNGS AND PLEURA: Left basilar opacity is present. A possible small left pleural effusion is noted. HEART AND MEDIASTINUM: TAVR is noted. Aortic atherosclerosis is present. BONES AND SOFT TISSUES: No acute osseous abnormality. IMPRESSION: 1. Left basilar opacity, which may be due to infiltrate or atelectasis. 2. Possible small left pleural effusion. Electronically signed  by: Norleen Kil MD 08/21/2024 11:52 AM EDT RP Workstation: [id number]   DG C-ARM BRONCHOSCOPY Result Date: 08/21/2024 C-ARM BRONCHOSCOPY: Fluoroscopy was utilized by the requesting physician.  No radiographic interpretation.   DG C-Arm 1-60 Min-No Report Result Date: 08/21/2024 Fluoroscopy was utilized by the requesting physician.  No radiographic interpretation.   ECHOCARDIOGRAM COMPLETE Result Date: 08/16/2024    ECHOCARDIOGRAM REPORT   Patient Name:   ALEXIS REBER Date of Exam: 08/16/2024 Medical Rec #:  [id number]        Height:       66.0 in Accession #:    [id number]       Weight:       177.0 lb Date of Birth:  [birth year]        BSA:          1.899 m Patient Age:    68 years         BP:           121/82 mmHg Patient Gender: F                HR:           75 bpm. Exam Location:  [address] Procedure: 2D Echo, 3D Echo, Cardiac Doppler, Color Doppler and Strain Analysis            (Both Spectral and Color Flow Doppler were utilized during            procedure). Indications:    Z95.2 TAVR  History:        Patient has prior history of Echocardiogram examinations, most                 recent 08/16/2023. Renal CA and Stroke; Risk                 Factors:Hypertension and Former Smoker. Previous echo LVEF 60%                 TAVR mean gradient 28 mmHg. PAP 26 mmHg.                 Aortic Valve: 23 mm Sapien prosthetic, stented (TAVR) valve is                 present in the aortic position. Procedure Date: 11/13/2019.  Sonographer:    Nolon Berg BA, RDCS Referring Phys: [id number] MAUDE JAYSON EMMER IMPRESSIONS  1. Left ventricular ejection fraction, by estimation, is 65 to 70%. The left ventricle has normal function. The left ventricle has no regional wall motion abnormalities. There is mild left ventricular hypertrophy. The average left ventricular global longitudinal strain is -18.7 %.  The global longitudinal strain is normal.  2. Right ventricular systolic function is normal. The right ventricular size is  normal.  3. The mitral valve is normal in structure. Trivial mitral valve regurgitation.  4. S/p TAVR (23 mm Sapien prosthesis; procedure date 11/13/2019) Peak nad mena gradients through the valve are 34 and 22 mm Hg respectively Dimensionless valve index is 0.4 OVerall relatively unchanged from echo done in October 2024. The aortic valve has  been repaired/replaced. Aortic valve regurgitation is not visualized. There is a 23 mm Sapien prosthetic (TAVR) valve present in the aortic position. Procedure Date: 11/13/2019. FINDINGS  Left Ventricle: Left ventricular ejection fraction, by estimation, is 65 to 70%. The left ventricle has normal function. The left ventricle has no regional wall motion abnormalities. The average left ventricular global longitudinal strain is -18.7 %. Strain was performed and the global longitudinal strain is normal. The left ventricular internal cavity size was normal in size. There is mild left ventricular hypertrophy. Right Ventricle: The right ventricular size is normal. Right vetricular wall thickness was not assessed. Right ventricular systolic function is normal. Left Atrium: Left atrial size was normal in size. Right Atrium: Right atrial size was normal in size. Pericardium: Trivial pericardial effusion is present. Mitral Valve: The mitral valve is normal in structure. Trivial mitral valve regurgitation. Tricuspid Valve: The tricuspid valve is normal in structure. Tricuspid valve regurgitation is trivial. Aortic Valve: S/p TAVR (23 mm Sapien prosthesis; procedure date 11/13/2019) Peak nad mena gradients through the valve are 34 and 22 mm Hg respectively Dimensionless valve index is 0.4 OVerall relatively unchanged from echo done in October 2024. The aortic  valve has been repaired/replaced. Aortic valve regurgitation is not visualized. Aortic valve mean gradient measures 22.0 mmHg. Aortic valve peak gradient measures 34.3 mmHg. Aortic valve area, by VTI measures 2.11 cm. There is a 23 mm  Sapien prosthetic, stented (TAVR) valve present in the aortic position. Procedure Date: 11/13/2019. Pulmonic Valve: The pulmonic valve was normal in structure. Pulmonic valve regurgitation is mild. Aorta: The aortic root and ascending aorta are structurally normal, with no evidence of dilitation. IAS/Shunts: No atrial level shunt detected by color flow Doppler.  LEFT VENTRICLE PLAX 2D LVIDd:         4.20 cm   Diastology LVIDs:         2.25 cm   LV e' medial:    5.17 cm/s LV PW:         1.10 cm   LV E/e' medial:  18.0 LV IVS:        1.20 cm   LV e' lateral:   4.79 cm/s LVOT diam:     2.60 cm   LV E/e' lateral: 19.5 LV SV:         124 LV SV Index:   65        2D Longitudinal Strain LVOT Area:     5.31 cm  2D Strain GLS (A4C):   -18.5 %                          2D Strain GLS (A3C):   -13.9 %                          2D Strain GLS (A2C):   -23.8 %  2D Strain GLS Avg:     -18.7 %                           3D Volume EF:                          3D EF:        60 %                          LV EDV:       88 ml                          LV ESV:       36 ml                          LV SV:        53 ml RIGHT VENTRICLE             IVC RV Basal diam:  3.50 cm     IVC diam: 1.90 cm RV Mid diam:    3.30 cm RV S prime:     16.50 cm/s  PULMONARY VEINS TAPSE (M-mode): 2.5 cm      A Reversal Velocity: 35.00 cm/s RVSP:           26.4 mmHg   Diastolic Velocity:  40.40 cm/s                             S/D Velocity:        1.40                             Systolic Velocity:   57.10 cm/s LEFT ATRIUM             Index        RIGHT ATRIUM           Index LA diam:        4.80 cm 2.53 cm/m   RA Pressure: 3.00 mmHg LA Vol (A2C):   32.4 ml 17.06 ml/m  RA Area:     15.30 cm LA Vol (A4C):   40.4 ml 21.28 ml/m  RA Volume:   42.60 ml  22.44 ml/m LA Biplane Vol: 36.4 ml 19.17 ml/m  AORTIC VALVE AV Area (Vmax):    2.32 cm AV Area (Vmean):   2.28 cm AV Area (VTI):     2.11 cm AV Vmax:           293.00 cm/s AV Vmean:           204.200 cm/s AV VTI:            0.587 m AV Peak Grad:      34.3 mmHg AV Mean Grad:      22.0 mmHg LVOT Vmax:         128.00 cm/s LVOT Vmean:        87.500 cm/s LVOT VTI:          0.233 m LVOT/AV VTI ratio: 0.40  AORTA Ao Root diam: 3.00 cm Ao Asc diam:  3.80 cm MITRAL VALVE                TRICUSPID VALVE MV Area (PHT): 4.39  cm     TR Peak grad:   23.4 mmHg MV Decel Time: 173 msec     TR Vmax:        242.00 cm/s MV E velocity: 93.30 cm/s   Estimated RAP:  3.00 mmHg MV A velocity: 108.00 cm/s  RVSP:           26.4 mmHg MV E/A ratio:  0.86                             SHUNTS                             Systemic VTI:  0.23 m                             Systemic Diam: 2.60 cm Vina Gull MD Electronically signed by Vina Gull MD Signature Date/Time: 08/16/2024/6:09:29 PM    Final     ASSESSMENT: This is a very pleasant 68 years old white female diagnosed with bilateral stage Ia (T1b, N0, M0) non-small cell lung cancer involving the superior segment of the left lower lobe as well as right upper lobe adenocarcinoma diagnosed in October 2025.   PLAN: I had a lengthy discussion with the patient and her husband today about her current disease stage, prognosis and treatment options.  I personally and independently reviewed the imaging studies as well as the pathology report and discussed the result with the patient and her husband. Assessment and Plan Assessment & Plan Non-small cell lung cancer, adenocarcinoma, left lower lobe and right upper lobe Diagnosed with non-small cell lung cancer, adenocarcinoma, in the left lower lobe (1.6 x 1.2 cm) and right upper lobe (0.6 cm). PET scan shows activity in the left nodule, indicating malignancy. No metastasis to brain or other organs. Family history of lung cancer. No current symptoms such as chest pain, dyspnea, or weight loss. Surgery is the preferred treatment option, starting with the left lower lobe nodule due to its size. Molecular marker testing is planned to  guide future treatment options. - Referred to Dr. Kerrin for surgical consultation for left lower lobe nodule resection. - Ordered molecular marker testing on biopsy tissue for ALK, BRAF, EGFR, HER2, KRAS, and other relevant markers. - Plan for potential radiation therapy for right upper lobe nodule post-surgery, depending on surgical outcomes and pulmonary function. - Will arrange follow-up in six weeks post-surgery to review pathology and discuss further treatment options.  Pulmonary nodules under surveillance Multiple small pulmonary nodules identified, with the largest being the adenocarcinoma in the left lower lobe. Other nodules are small and require monitoring for growth. Close surveillance is necessary due to family history and previous cancer diagnoses. - Continue surveillance of pulmonary nodules with regular imaging to monitor for growth. - Will consider targeted therapy or chemotherapy if nodules grow and molecular markers are identified. The patient was advised to call immediately if she has any other concerning symptoms in the interval.    The patient voices understanding of current disease status and treatment options and is in agreement with the current care plan.  All questions were answered. The patient knows to call the clinic with any problems, questions or concerns. We can certainly see the patient much sooner if necessary.  Thank you so much for allowing me to participate in the care of [name]. I will  continue to follow up the patient with you and assist in her care.  The total time spent in the appointment was 60 minutes including review of chart and various tests results, discussions about plan of care and coordination of care plan .   Disclaimer: This note was dictated with voice recognition software. Similar sounding words can inadvertently be transcribed and may not be corrected upon review.   Sherrod MARLA Sherrod September 12, 2024, 3:05 PM

## 2024-09-14 ENCOUNTER — Ambulatory Visit: Admitting: Acute Care

## 2024-09-18 ENCOUNTER — Ambulatory Visit
Attending: Thoracic Surgery (Cardiothoracic Vascular Surgery) | Admitting: Thoracic Surgery (Cardiothoracic Vascular Surgery)

## 2024-09-18 ENCOUNTER — Other Ambulatory Visit: Payer: Self-pay | Admitting: *Deleted

## 2024-09-18 ENCOUNTER — Encounter: Payer: Self-pay | Admitting: Thoracic Surgery (Cardiothoracic Vascular Surgery)

## 2024-09-18 ENCOUNTER — Encounter: Payer: Self-pay | Admitting: *Deleted

## 2024-09-18 VITALS — BP 130/73 | HR 90 | Resp 20 | Ht 66.0 in | Wt 170.0 lb

## 2024-09-18 DIAGNOSIS — C3432 Malignant neoplasm of lower lobe, left bronchus or lung: Secondary | ICD-10-CM

## 2024-09-18 DIAGNOSIS — C3411 Malignant neoplasm of upper lobe, right bronchus or lung: Secondary | ICD-10-CM | POA: Diagnosis not present

## 2024-09-18 NOTE — H&P (View-Only) (Signed)
 PCP is Katina Pfeiffer, PA-C Referring Provider is Ruthell Lauraine FALCON, NP  Chief Complaint  Patient presents with   Lung Cancer    Surgical consult,/ Bronch 08/21/24/ Chest CT 06/22/24/ PET scan 09/04/24/ MR Brain 09/04/24/ PFT's 09/03/24    HPI: Mrs. Barritt is sent for consultation regarding bilateral stage Ia adenocarcinomas.  [name] is a 68 year old woman with a history of tobacco use, aortic stenosis, status post TAVR, renal cell carcinoma, cerebellar stroke, reflux, hypertension, and numerous pulmonary nodules.  She has smoked on and off since the 1970s.  Never more than about a half a pack a day.  Quit smoking about 20 years ago.  Her mother and sister both died of aggressive cancers.  She was concerned about the possibility of lung cancer and her primary care ordered a CT of the chest.  She was noted to have numerous pulmonary nodules.  Those were followed.  On a recent scan left lower lobe nodule increased in size and solid component and there was a new suspicious right upper lobe nodule.  PET/CT showed the left upper lobe nodule was hypermetabolic.  No mediastinal or hilar adenopathy.  Most of the pulmonary nodules were too small to characterize.  Dr. Shelah performed robotic bronchoscopy.  Samples from the superior segment of the left lower lobe and the inferior right upper lobe were positive for adenocarcinoma.    She is retired.  Remains active.  Denies any chest pain, pressure, tightness, shortness of breath, change in appetite, weight loss, headaches, visual changes, or new bone or joint pain.  Zubrod Score: At the time of surgery this patient's most appropriate activity status/level should be described as: [x]     0    Normal activity, no symptoms []     1    Restricted in physical strenuous activity but ambulatory, able to do out light work []     2    Ambulatory and capable of self care, unable to do work activities, up and about >50 % of waking hours                               []     3    Only limited self care, in bed greater than 50% of waking hours []     4    Completely disabled, no self care, confined to bed or chair []     5    Moribund  Past Medical History:  Diagnosis Date   Diverticulitis, colon    GERD (gastroesophageal reflux disease)    H/O colonoscopy 07/19/2022   Hypertension    Mild dilation of ascending aorta    Pulmonary nodules    seen on pre TAVR CT, needs follow up    Renal cancer [facility]) 2007   Surgically removed from right kidney   S/P TAVR (transcatheter aortic valve replacement)    s/p TAVR w/ a Edwards Sapien 3 Ultra THV via the TF approach on 11/13/19 with Dr. Wonda and Lucas.    Severe aortic stenosis    Stroke (cerebrum) (HCC)    a. MRI of brain 12/2021 [facility] showed punctate subacute versus chronic white matter lacunar infarct in the posterior left corona radiata/centrum semiovale.    Past Surgical History:  Procedure Laterality Date   APPENDECTOMY     COLON SURGERY     HERNIA REPAIR     OVARIAN CYST SURGERY     RIGHT/LEFT HEART CATH AND CORONARY ANGIOGRAPHY N/A 08/22/2019  Procedure: RIGHT/LEFT HEART CATH AND CORONARY ANGIOGRAPHY;  Surgeon: Claudene Victory ORN, MD;  Location: [facility] INVASIVE CV LAB;  Service: Cardiovascular;  Laterality: N/A;   TEE WITHOUT CARDIOVERSION N/A 11/13/2019   Procedure: TRANSESOPHAGEAL ECHOCARDIOGRAM (TEE);  Surgeon: Wonda Sharper, MD;  Location: [facility] INVASIVE CV LAB;  Service: Open Heart Surgery;  Laterality: N/A;   TRANSCATHETER AORTIC VALVE REPLACEMENT, TRANSFEMORAL N/A 11/13/2019   Procedure: TRANSCATHETER AORTIC VALVE REPLACEMENT, TRANSFEMORAL;  Surgeon: Wonda Sharper, MD;  Location: [facility] INVASIVE CV LAB;  Service: Open Heart Surgery;  Laterality: N/A;   VIDEO BRONCHOSCOPY WITH ENDOBRONCHIAL NAVIGATION Bilateral 08/21/2024   Procedure: VIDEO BRONCHOSCOPY WITH ENDOBRONCHIAL NAVIGATION;  Surgeon: Shelah Lamar RAMAN, MD;  Location: [facility] ENDOSCOPY;  Service: Pulmonary;  Laterality: Bilateral;    Family History   Problem Relation Age of Onset   Breast cancer Mother    Cancer Mother        Lung   Cancer Father        Lung   Hypertension Father    Cancer Sister        Lung   Heart attack Paternal Aunt    Heart attack Paternal Grandfather     Social History Social History   Tobacco Use   Smoking status: Former    Current packs/day: 0.00    Average packs/day: 1 pack/day for 10.0 years (10.0 ttl pk-yrs)    Types: Cigarettes    Start date: 10/26/1995    Quit date: 10/25/2005    Years since quitting: 18.9   Smokeless tobacco: Never   Tobacco comments:    quit around 2007   Vaping Use   Vaping status: Never Used  Substance Use Topics   Alcohol use: Yes    Comment: occ   Drug use: No    Current Outpatient Medications  Medication Sig Dispense Refill   aspirin  EC 81 MG tablet Take 1 tablet (81 mg total) by mouth daily. Swallow whole. 90 tablet 3   atorvastatin  (LIPITOR) 40 MG tablet TAKE 1 TABLET BY MOUTH DAILY 90 tablet 2   cetirizine (ZYRTEC) 10 MG tablet Take 10 mg by mouth daily.     fluticasone  (FLONASE ) 50 MCG/ACT nasal spray Place 1 spray into both nostrils as needed for allergies or rhinitis.     ibuprofen (ADVIL) 200 MG tablet Take 200 mg by mouth every 4 (four) hours as needed.     magnesium  oxide (MAG-OX) 400 MG tablet Take 1 tablet (400 mg total) by mouth daily. 30 tablet 11   metoprolol  succinate (TOPROL -XL) 50 MG 24 hr tablet TAKE 1 TABLET BY MOUTH DAILY WITH OR IMMEDIATELY FOLLOWING A MEAL (Patient taking differently: Take 50 mg by mouth daily.) 90 tablet 2   omeprazole  (PRILOSEC) 40 MG capsule TAKE 1 CAPSULE BY MOUTH DAILY 90 capsule 3   OVER THE COUNTER MEDICATION Take 1 tablet by mouth daily at 6 (six) AM. Trans pterostilbene     No current facility-administered medications for this visit.    Allergies  Allergen Reactions   Diphenhydramine Hcl     loopy   Protonix  [Pantoprazole ] Nausea And Vomiting    Nausea, headache, heightened senses.    Ciprofloxacin Itching    Codeine Itching   Diphenhydramine Other (See Comments)   Morphine And Codeine Nausea Only    Anxiety and Nausea     Review of Systems  Constitutional:  Negative for activity change, appetite change and unexpected weight change.  HENT:  Positive for dental problem (Dentures). Negative for trouble swallowing and voice change.  Eyes:  Negative for visual disturbance.  Respiratory:  Negative for cough, shortness of breath and wheezing.   Cardiovascular:  Negative for chest pain and leg swelling.  Genitourinary:  Negative for dysuria.  Musculoskeletal: Negative.   Neurological:  Negative for seizures and weakness.  Hematological:  Negative for adenopathy. Does not bruise/bleed easily.  All other systems reviewed and are negative.   BP 130/73   Pulse 90   Resp 20   Ht 5' 6 (1.676 m)   Wt 170 lb (77.1 kg)   SpO2 95% Comment: RA  BMI 27.44 kg/m  Physical Exam Vitals reviewed.  Constitutional:      General: She is not in acute distress.    Appearance: Normal appearance.  HENT:     Head: Normocephalic and atraumatic.  Eyes:     General: No scleral icterus.    Extraocular Movements: Extraocular movements intact.  Cardiovascular:     Pulses: Normal pulses.     Heart sounds: Murmur (2/6 systolic murmur) heard.  Pulmonary:     Effort: Pulmonary effort is normal. No respiratory distress.     Breath sounds: Normal breath sounds. No wheezing.  Abdominal:     General: There is no distension.     Palpations: Abdomen is soft.     Tenderness: There is no abdominal tenderness.  Musculoskeletal:     Cervical back: Neck supple. No rigidity.  Skin:    General: Skin is warm and dry.  Neurological:     General: No focal deficit present.     Mental Status: She is alert and oriented to person, place, and time.     Cranial Nerves: No cranial nerve deficit.     Motor: No weakness.     Diagnostic Tests: PET AND CT SKULL BASE TO MID THIGH 09/04/2024 06:00:40 PM   TECHNIQUE:    RADIOPHARMACEUTICAL: 8.76 mCi F-18 FDG Uptake time 60 minutes. Glucose level 87 mg/dl.   PET imaging was acquired from the base of the skull to the mid thighs. Non-contrast enhanced computed tomography was obtained for attenuation correction and anatomic localization.   COMPARISON: CT 06/22/2024   CLINICAL HISTORY: Non-small cell lung cancer (NSCLC), metastatic, assess treatment response.   FINDINGS:   HEAD AND NECK: No metabolically active cervical lymphadenopathy.   CHEST: Ill defined nodule in the superior aspect of the left lower lobe measures 13 mm on image 71. This nodule has moderate hypermetabolic activity. There is a biopsy clip adjacent to the nodule. SUV max equal 3.2. Hypermetabolic nodule in the superior aspect of the left lobe is concerning for bronchial carcinoma.   Biopsy clip in the right upper lobe on image 81 is adjacent to a subsolid nodule without metabolic activity. No metabolic activity associated with subsolid nodule in the right upper lobe. Indeterminate finding.   No hypermetabolic activity mediastinal lymph nodes. No evidence of metastatic mediastinal adenopathy.   ABDOMEN AND PELVIS: CT findings surgical anastomosis of the sigmoid colon. Hernia mesh along the ventral abdominal wall. No metabolically active lymphadenopathy. Physiologic activity within the gastrointestinal and genitourinary systems.   BONES AND SOFT TISSUE: Multiple hemangiomas within the THORACIC SPINE. No abnormal FDG activity localizes to the bones. No distant metastatic disease.   IMPRESSION: 1. Ill-defined 13 mm nodule in the superior aspect of the left lower lobe with moderate hypermetabolic activity (SUV max 3.2), concerning for bronchogenic carcinoma. 2. Subsolid right upper lobe nodule without metabolic activity. Indeterminate finding. 3. No hypermetabolic mediastinal adenopathy. 4. No distant hypermetabolic metastatic disease.  Electronically signed by: Norleen Boxer MD 09/05/2024 11:46 AM EST RP Workstation: [id number]   I personally reviewed the CT and PET/CT images.  Numerous pulmonary nodules bilaterally.  Dominant nodule in superior segment left lower lobe with solid component and hypermetabolic on PET.  No activity and subsolid right upper lobe nodule.  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  Right upper and left lower lobe nodules.  Both T1, N0, stage Ia.  We discussed treatment options including surgical resection of both lesions in a staged fashion, stereotactic radiation to both lesions concurrently, and surgery for the left lower lobe nodule followed by stereotactic radiation of the nodule on the right.  We discussed the advantages and disadvantages of each of those approaches.  She very strongly wishes to have surgery.  The plan would be to do the left lower lobe nodule first and then after a time for her to recover with no backing to the right side.  If she has complications or decides she does not want to do the right side surgically then she can be treated with stereotactic radiation.  I described the postoperative procedure to her.  The plan would be to do a robotic assisted left lower lobe superior segmentectomy.  She had minimal effect on her pulmonary function.  Given the very small solid component and location of the nodule we should be able to achieve an adequate margin.  She understands the general nature of the procedure including the need for general anesthesia, the incision to be used, the use of the surgical robot, use of drains to postoperatively, the expected hospital stay, and the  overall recovery.  I informed her of the indications, risks, benefits, and alternatives.  She understands the risks include, but not limited to death, MI, DVT, PE, bleeding, possible need for transfusion, infection, prolonged air leak, cardiac arrhythmias, as well as possibility of other unforeseeable complications.  She understands that regardless of how these 2000 are treated there are other nodules that we will need follow-up and could also be cancerous.  Status post TAVR.  Recent echo showed stable gradients.  Recent cardiac CT showed a coronary calcium  score of 0.  Plan: Robotic assisted left lower lobe superior segmentectomy on Friday, 10/05/2024  [name], MD [facility] [phone]

## 2024-09-18 NOTE — Progress Notes (Signed)
 PCP is Katina Pfeiffer, PA-C Referring Provider is Ruthell Lauraine FALCON, NP  Chief Complaint  Patient presents with   Lung Cancer    Surgical consult,/ Bronch 08/21/24/ Chest CT 06/22/24/ PET scan 09/04/24/ MR Brain 09/04/24/ PFT's 09/03/24    HPI: [name] is sent for consultation regarding bilateral stage Ia adenocarcinomas.  [name] is a 68 year old woman with a history of tobacco use, aortic stenosis, status post TAVR, renal cell carcinoma, cerebellar stroke, reflux, hypertension, and numerous pulmonary nodules.  [name] has smoked on and off since the 1970s.  Never more than about a half a pack a day.  Quit smoking about 20 years ago.  Her mother and sister both died of aggressive cancers.  [name] was concerned about the possibility of lung cancer and her primary care ordered a CT of the chest.  [name] was noted to have numerous pulmonary nodules.  Those were followed.  On a recent scan left lower lobe nodule increased in size and solid component and there was a new suspicious right upper lobe nodule.  PET/CT showed the left upper lobe nodule was hypermetabolic.  No mediastinal or hilar adenopathy.  Most of the pulmonary nodules were too small to characterize.  Dr. Shelah performed robotic bronchoscopy.  Samples from the superior segment of the left lower lobe and the inferior right upper lobe were positive for adenocarcinoma.    [name] is retired.  Remains active.  Denies any chest pain, pressure, tightness, shortness of breath, change in appetite, weight loss, headaches, visual changes, or new bone or joint pain.  Zubrod Score: At the time of surgery this patient's most appropriate activity status/level should be described as: [x]     0    Normal activity, no symptoms []     1    Restricted in physical strenuous activity but ambulatory, able to do out light work []     2    Ambulatory and capable of self care, unable to do work activities, up and about >50 % of waking hours                               []     3    Only limited self care, in bed greater than 50% of waking hours []     4    Completely disabled, no self care, confined to bed or chair []     5    Moribund  Past Medical History:  Diagnosis Date   Diverticulitis, colon    GERD (gastroesophageal reflux disease)    H/O colonoscopy 07/19/2022   Hypertension    Mild dilation of ascending aorta    Pulmonary nodules    seen on pre TAVR CT, needs follow up    Renal cancer [facility]) 2007   Surgically removed from right kidney   S/P TAVR (transcatheter aortic valve replacement)    s/p TAVR w/ a Edwards Sapien 3 Ultra THV via the TF approach on 11/13/19 with Dr. Wonda and Lucas.    Severe aortic stenosis    Stroke (cerebrum) (HCC)    a. MRI of brain 12/2021 [facility] showed punctate subacute versus chronic white matter lacunar infarct in the posterior left corona radiata/centrum semiovale.    Past Surgical History:  Procedure Laterality Date   APPENDECTOMY     COLON SURGERY     HERNIA REPAIR     OVARIAN CYST SURGERY     RIGHT/LEFT HEART CATH AND CORONARY ANGIOGRAPHY N/A 08/22/2019  Procedure: RIGHT/LEFT HEART CATH AND CORONARY ANGIOGRAPHY;  Surgeon: Claudene Victory ORN, MD;  Location: [facility] INVASIVE CV LAB;  Service: Cardiovascular;  Laterality: N/A;   TEE WITHOUT CARDIOVERSION N/A 11/13/2019   Procedure: TRANSESOPHAGEAL ECHOCARDIOGRAM (TEE);  Surgeon: Wonda Sharper, MD;  Location: [facility] INVASIVE CV LAB;  Service: Open Heart Surgery;  Laterality: N/A;   TRANSCATHETER AORTIC VALVE REPLACEMENT, TRANSFEMORAL N/A 11/13/2019   Procedure: TRANSCATHETER AORTIC VALVE REPLACEMENT, TRANSFEMORAL;  Surgeon: Wonda Sharper, MD;  Location: [facility] INVASIVE CV LAB;  Service: Open Heart Surgery;  Laterality: N/A;   VIDEO BRONCHOSCOPY WITH ENDOBRONCHIAL NAVIGATION Bilateral 08/21/2024   Procedure: VIDEO BRONCHOSCOPY WITH ENDOBRONCHIAL NAVIGATION;  Surgeon: Shelah Lamar RAMAN, MD;  Location: [facility] ENDOSCOPY;  Service: Pulmonary;  Laterality: Bilateral;    Family History   Problem Relation Age of Onset   Breast cancer Mother    Cancer Mother        Lung   Cancer Father        Lung   Hypertension Father    Cancer Sister        Lung   Heart attack Paternal Aunt    Heart attack Paternal Grandfather     Social History Social History   Tobacco Use   Smoking status: Former    Current packs/day: 0.00    Average packs/day: 1 pack/day for 10.0 years (10.0 ttl pk-yrs)    Types: Cigarettes    Start date: 10/26/1995    Quit date: 10/25/2005    Years since quitting: 18.9   Smokeless tobacco: Never   Tobacco comments:    quit around 2007   Vaping Use   Vaping status: Never Used  Substance Use Topics   Alcohol use: Yes    Comment: occ   Drug use: No    Current Outpatient Medications  Medication Sig Dispense Refill   aspirin  EC 81 MG tablet Take 1 tablet (81 mg total) by mouth daily. Swallow whole. 90 tablet 3   atorvastatin  (LIPITOR) 40 MG tablet TAKE 1 TABLET BY MOUTH DAILY 90 tablet 2   cetirizine (ZYRTEC) 10 MG tablet Take 10 mg by mouth daily.     fluticasone  (FLONASE ) 50 MCG/ACT nasal spray Place 1 spray into both nostrils as needed for allergies or rhinitis.     ibuprofen (ADVIL) 200 MG tablet Take 200 mg by mouth every 4 (four) hours as needed.     magnesium  oxide (MAG-OX) 400 MG tablet Take 1 tablet (400 mg total) by mouth daily. 30 tablet 11   metoprolol  succinate (TOPROL -XL) 50 MG 24 hr tablet TAKE 1 TABLET BY MOUTH DAILY WITH OR IMMEDIATELY FOLLOWING A MEAL (Patient taking differently: Take 50 mg by mouth daily.) 90 tablet 2   omeprazole  (PRILOSEC) 40 MG capsule TAKE 1 CAPSULE BY MOUTH DAILY 90 capsule 3   OVER THE COUNTER MEDICATION Take 1 tablet by mouth daily at 6 (six) AM. Trans pterostilbene     No current facility-administered medications for this visit.    Allergies  Allergen Reactions   Diphenhydramine Hcl     loopy   Protonix  [Pantoprazole ] Nausea And Vomiting    Nausea, headache, heightened senses.    Ciprofloxacin Itching    Codeine Itching   Diphenhydramine Other (See Comments)   Morphine And Codeine Nausea Only    Anxiety and Nausea     Review of Systems  Constitutional:  Negative for activity change, appetite change and unexpected weight change.  HENT:  Positive for dental problem (Dentures). Negative for trouble swallowing and voice change.  Eyes:  Negative for visual disturbance.  Respiratory:  Negative for cough, shortness of breath and wheezing.   Cardiovascular:  Negative for chest pain and leg swelling.  Genitourinary:  Negative for dysuria.  Musculoskeletal: Negative.   Neurological:  Negative for seizures and weakness.  Hematological:  Negative for adenopathy. Does not bruise/bleed easily.  All other systems reviewed and are negative.   BP 130/73   Pulse 90   Resp 20   Ht 5' 6 (1.676 m)   Wt 170 lb (77.1 kg)   SpO2 95% Comment: RA  BMI 27.44 kg/m  Physical Exam Vitals reviewed.  Constitutional:      General: [name] is not in acute distress.    Appearance: Normal appearance.  HENT:     Head: Normocephalic and atraumatic.  Eyes:     General: No scleral icterus.    Extraocular Movements: Extraocular movements intact.  Cardiovascular:     Pulses: Normal pulses.     Heart sounds: Murmur (2/6 systolic murmur) heard.  Pulmonary:     Effort: Pulmonary effort is normal. No respiratory distress.     Breath sounds: Normal breath sounds. No wheezing.  Abdominal:     General: There is no distension.     Palpations: Abdomen is soft.     Tenderness: There is no abdominal tenderness.  Musculoskeletal:     Cervical back: Neck supple. No rigidity.  Skin:    General: Skin is warm and dry.  Neurological:     General: No focal deficit present.     Mental Status: [name] is alert and oriented to person, place, and time.     Cranial Nerves: No cranial nerve deficit.     Motor: No weakness.     Diagnostic Tests: PET AND CT SKULL BASE TO MID THIGH 09/04/2024 06:00:40 PM   TECHNIQUE:    RADIOPHARMACEUTICAL: 8.76 mCi F-18 FDG Uptake time 60 minutes. Glucose level 87 mg/dl.   PET imaging was acquired from the base of the skull to the mid thighs. Non-contrast enhanced computed tomography was obtained for attenuation correction and anatomic localization.   COMPARISON: CT 06/22/2024   CLINICAL HISTORY: Non-small cell lung cancer (NSCLC), metastatic, assess treatment response.   FINDINGS:   HEAD AND NECK: No metabolically active cervical lymphadenopathy.   CHEST: Ill defined nodule in the superior aspect of the left lower lobe measures 13 mm on image 71. This nodule has moderate hypermetabolic activity. There is a biopsy clip adjacent to the nodule. SUV max equal 3.2. Hypermetabolic nodule in the superior aspect of the left lobe is concerning for bronchial carcinoma.   Biopsy clip in the right upper lobe on image 81 is adjacent to a subsolid nodule without metabolic activity. No metabolic activity associated with subsolid nodule in the right upper lobe. Indeterminate finding.   No hypermetabolic activity mediastinal lymph nodes. No evidence of metastatic mediastinal adenopathy.   ABDOMEN AND PELVIS: CT findings surgical anastomosis of the sigmoid colon. Hernia mesh along the ventral abdominal wall. No metabolically active lymphadenopathy. Physiologic activity within the gastrointestinal and genitourinary systems.   BONES AND SOFT TISSUE: Multiple hemangiomas within the THORACIC SPINE. No abnormal FDG activity localizes to the bones. No distant metastatic disease.   IMPRESSION: 1. Ill-defined 13 mm nodule in the superior aspect of the left lower lobe with moderate hypermetabolic activity (SUV max 3.2), concerning for bronchogenic carcinoma. 2. Subsolid right upper lobe nodule without metabolic activity. Indeterminate finding. 3. No hypermetabolic mediastinal adenopathy. 4. No distant hypermetabolic metastatic disease.  Electronically signed by: Norleen Boxer MD 09/05/2024 11:46 AM EST RP Workstation: [id number]   I personally reviewed the CT and PET/CT images.  Numerous pulmonary nodules bilaterally.  Dominant nodule in superior segment left lower lobe with solid component and hypermetabolic on PET.  No activity and subsolid right upper lobe nodule.  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  Right upper and left lower lobe nodules.  Both T1, N0, stage Ia.  We discussed treatment options including surgical resection of both lesions in a staged fashion, stereotactic radiation to both lesions concurrently, and surgery for the left lower lobe nodule followed by stereotactic radiation of the nodule on the right.  We discussed the advantages and disadvantages of each of those approaches.  [name] very strongly wishes to have surgery.  The plan would be to do the left lower lobe nodule first and then after a time for her to recover with no backing to the right side.  If [name] has complications or decides [name] does not want to do the right side surgically then [name] can be treated with stereotactic radiation.  I described the postoperative procedure to her.  The plan would be to do a robotic assisted left lower lobe superior segmentectomy.  [name] had minimal effect on her pulmonary function.  Given the very small solid component and location of the nodule we should be able to achieve an adequate margin.  [name] understands the general nature of the procedure including the need for general anesthesia, the incision to be used, the use of the surgical robot, use of drains to postoperatively, the expected hospital stay, and the  overall recovery.  I informed her of the indications, risks, benefits, and alternatives.  [name] understands the risks include, but not limited to death, MI, DVT, PE, bleeding, possible need for transfusion, infection, prolonged air leak, cardiac arrhythmias, as well as possibility of other unforeseeable complications.  [name] understands that regardless of how these 2000 are treated there are other nodules that we will need follow-up and could also be cancerous.  Status post TAVR.  Recent echo showed stable gradients.  Recent cardiac CT showed a coronary calcium  score of 0.  Plan: Robotic assisted left lower lobe superior segmentectomy on Friday, 10/05/2024  Tiffany JAYSON Millers, MD [facility] [phone]

## 2024-09-19 ENCOUNTER — Encounter: Payer: Self-pay | Admitting: *Deleted

## 2024-09-27 ENCOUNTER — Encounter: Payer: Self-pay | Admitting: *Deleted

## 2024-10-02 ENCOUNTER — Encounter (HOSPITAL_COMMUNITY): Payer: Self-pay

## 2024-10-02 NOTE — Pre-Procedure Instructions (Signed)
 Surgical Instructions   Your procedure is scheduled on October 05, 2024. Report to [facility] Main Entrance A at 7:30 A.M., then check in with the Admitting office. Any questions or running late day of surgery: call [phone]  Questions prior to your surgery date: call [phone], Monday-Friday, 8am-4pm. If you experience any cold or flu symptoms such as cough, fever, chills, shortness of breath, etc. between now and your scheduled surgery, please notify us  at the above number.     Remember:  Do not eat or drink after midnight the night before your surgery    Take these medicines the morning of surgery with A SIP OF WATER: atorvastatin  (LIPITOR)  cetirizine (ZYRTEC)  metoprolol  succinate (TOPROL -XL)  omeprazole  (PRILOSEC)    May take these medicines IF NEEDED: fluticasone  (FLONASE ) nasal spray    Continue taking your Aspirin  through the day before surgery. DO NOT take any the morning of surgery.   One week prior to surgery, STOP taking any Aleve, Naproxen, Ibuprofen, Motrin, Advil, Goody's, BC's, all herbal medications, fish oil, and non-prescription vitamins.                     Do NOT Smoke (Tobacco/Vaping) for 24 hours prior to your procedure.  If you use a CPAP at night, you may bring your mask/headgear for your overnight stay.   You will be asked to remove any contacts, glasses, piercing's, hearing aid's, dentures/partials prior to surgery. Please bring cases for these items if needed.    Patients discharged the day of surgery will not be allowed to drive home, and someone needs to stay with them for 24 hours.  SURGICAL WAITING ROOM VISITATION Patients may have no more than 2 support people in the waiting area - these visitors may rotate.   Pre-op nurse will coordinate an appropriate time for 1 ADULT support person, who may not rotate, to accompany patient in pre-op.  Children under the age of 35 must have an adult with them who is not the patient and must  remain in the main waiting area with an adult.  If the patient needs to stay at the hospital during part of their recovery, the visitor guidelines for inpatient rooms apply.  Please refer to the [facility] website for the visitor guidelines for any additional information.   If you received a COVID test during your pre-op visit  it is requested that you wear a mask when out in public, stay away from anyone that may not be feeling well and notify your surgeon if you develop symptoms. If you have been in contact with anyone that has tested positive in the last 10 days please notify you surgeon.      Pre-operative CHG Bathing Instructions   You can play a key role in reducing the risk of infection after surgery. Your skin needs to be as free of germs as possible. You can reduce the number of germs on your skin by washing with CHG (chlorhexidine  gluconate) soap before surgery. CHG is an antiseptic soap that kills germs and continues to kill germs even after washing.   DO NOT use if you have an allergy to chlorhexidine /CHG or antibacterial soaps. If your skin becomes reddened or irritated, stop using the CHG and notify one of our RNs at [phone].              TAKE A SHOWER THE NIGHT BEFORE SURGERY   Please keep in mind the following:  DO NOT shave, including legs  and underarms, 48 hours prior to surgery.   You may shave your face before/day of surgery.  Place clean sheets on your bed the night before surgery Use a clean washcloth (not used since being washed) for shower. DO NOT sleep with pet's night before surgery.  CHG Shower Instructions:  Wash your face and private area with normal soap. If you choose to wash your hair, wash first with your normal shampoo.  After you use shampoo/soap, rinse your hair and body thoroughly to remove shampoo/soap residue.  Turn the water OFF and apply half the bottle of CHG soap to a CLEAN washcloth.  Apply CHG soap ONLY FROM YOUR NECK DOWN TO YOUR TOES  (washing for 3-5 minutes)  DO NOT use CHG soap on face, private areas, open wounds, or sores.  Pay special attention to the area where your surgery is being performed.  If you are having back surgery, having someone wash your back for you may be helpful. Wait 2 minutes after CHG soap is applied, then you may rinse off the CHG soap.  Pat dry with a clean towel  Put on clean pajamas    Additional instructions for the day of surgery: If you choose, you may shower the morning of surgery with an antibacterial soap.  DO NOT APPLY any lotions, deodorants, cologne, or perfumes.   Do not wear jewelry or makeup Do not wear nail polish, gel polish, artificial nails, or any other type of covering on natural nails (fingers and toes) Do not bring valuables to the hospital. [facility] is not responsible for valuables/personal belongings. Put on clean/comfortable clothes.  Please brush your teeth.  Ask your nurse before applying any prescription medications to the skin.

## 2024-10-03 ENCOUNTER — Encounter (HOSPITAL_COMMUNITY): Payer: Self-pay

## 2024-10-03 ENCOUNTER — Ambulatory Visit (HOSPITAL_COMMUNITY)
Admission: RE | Admit: 2024-10-03 | Discharge: 2024-10-03 | Disposition: A | Discharge status: Home / Self Care | Source: Ambulatory Visit | Attending: Thoracic Surgery (Cardiothoracic Vascular Surgery) | Admitting: Thoracic Surgery (Cardiothoracic Vascular Surgery)

## 2024-10-03 ENCOUNTER — Inpatient Hospital Stay (HOSPITAL_COMMUNITY)
Admission: RE | Admit: 2024-10-03 | Discharge: 2024-10-03 | Disposition: A | Source: Ambulatory Visit | Attending: Thoracic Surgery (Cardiothoracic Vascular Surgery)

## 2024-10-03 VITALS — BP 126/73 | HR 105 | Temp 98.1°F | Resp 20 | Ht 66.0 in | Wt 174.7 lb

## 2024-10-03 DIAGNOSIS — Z01818 Encounter for other preprocedural examination: Secondary | ICD-10-CM | POA: Insufficient documentation

## 2024-10-03 DIAGNOSIS — C3432 Malignant neoplasm of lower lobe, left bronchus or lung: Secondary | ICD-10-CM

## 2024-10-03 LAB — URINALYSIS, ROUTINE W REFLEX MICROSCOPIC
Bilirubin Urine: NEGATIVE
Glucose, UA: NEGATIVE mg/dL
Hgb urine dipstick: NEGATIVE
Ketones, ur: NEGATIVE mg/dL
Leukocytes,Ua: NEGATIVE
Nitrite: NEGATIVE
Protein, ur: NEGATIVE mg/dL
Specific Gravity, Urine: 1.015 (ref 1.005–1.030)
pH: 6 (ref 5.0–8.0)

## 2024-10-03 LAB — TYPE AND SCREEN
ABO/RH(D): O POS
Antibody Screen: NEGATIVE

## 2024-10-03 LAB — COMPREHENSIVE METABOLIC PANEL WITH GFR
ALT: 25 U/L (ref 0–44)
AST: 26 U/L (ref 15–41)
Albumin: 4.4 g/dL (ref 3.5–5.0)
Alkaline Phosphatase: 92 U/L (ref 38–126)
Anion gap: 12 (ref 5–15)
BUN: 12 mg/dL (ref 8–23)
CO2: 28 mmol/L (ref 22–32)
Calcium: 10 mg/dL (ref 8.9–10.3)
Chloride: 100 mmol/L (ref 98–111)
Creatinine, Ser: 0.62 mg/dL (ref 0.44–1.00)
GFR, Estimated: >60 mL/min (ref >60)
Glucose, Bld: 105 mg/dL — ABNORMAL HIGH (ref 70–99)
Potassium: 3.9 mmol/L (ref 3.5–5.1)
Sodium: 140 mmol/L (ref 135–145)
Total Bilirubin: 0.8 mg/dL (ref 0.0–1.2)
Total Protein: 8.2 g/dL — ABNORMAL HIGH (ref 6.5–8.1)

## 2024-10-03 LAB — CBC
HCT: 40.3 % (ref 36.0–46.0)
Hemoglobin: 12.9 g/dL (ref 12.0–15.0)
MCH: 28 pg (ref 26.0–34.0)
MCHC: 32 g/dL (ref 30.0–36.0)
MCV: 87.6 fL (ref 80.0–100.0)
Platelets: 269 K/uL (ref 150–400)
RBC: 4.6 MIL/uL (ref 3.87–5.11)
RDW: 13.3 % (ref 11.5–15.5)
WBC: 8.1 K/uL (ref 4.0–10.5)
nRBC: 0 % (ref 0.0–0.2)

## 2024-10-03 LAB — APTT: aPTT: 28 s (ref 24–36)

## 2024-10-03 LAB — PROTIME-INR
INR: 1 (ref 0.8–1.2)
Prothrombin Time: 13.6 s (ref 11.4–15.2)

## 2024-10-03 NOTE — Progress Notes (Signed)
 PCP - Katina Pfeiffer, PA-C  Cardiologist - Delford Maude BROCKS, MD   PPM/ICD - denies Device Orders - denies Rep Notified - denies  Chest x-ray - 10-03-24 EKG - 10-03-24 Stress Test - 08-04-18 ECHO - 08-16-24 Cardiac Cath - 11-13-19  Sleep Study - denies CPAP - N/A  Dm -denies  Blood Thinner Instructions: denies  Aspirin  Instructions: Do not take day of surgery  ERAS Protcol -NPO  COVID TEST- n/a   Anesthesia review: Yes, HTN, CVA, Hz of heart murmur  Patient denies shortness of breath, fever, cough and chest pain at PAT appointment   All instructions explained to the patient, with a verbal understanding of the material. Patient agrees to go over the instructions while at home for a better understanding. Patient also instructed to self quarantine after being tested for COVID-19. The opportunity to ask questions was provided.

## 2024-10-04 NOTE — Progress Notes (Signed)
 Spoke with the pt, she will arrive tom at 0700. NPO post mn.

## 2024-10-04 NOTE — Anesthesia Preprocedure Evaluation (Addendum)
 Anesthesia Evaluation  Patient identified by MRN, date of birth, ID band Patient awake    Reviewed: Allergy & Precautions, NPO status , Patient's Chart, lab work & pertinent test results, reviewed documented beta blocker date and time   Airway Mallampati: III  TM Distance: >3 FB   Mouth opening: Limited Mouth Opening  Dental no notable dental hx.    Pulmonary former smoker   breath sounds clear to auscultation       Cardiovascular hypertension, (-) CAD and (-) Past MI + Valvular Problems/Murmurs  Rhythm:Regular Rate:Normal  AS s/p TAVR   1. Left ventricular ejection fraction, by estimation, is 65 to 70%. The  left ventricle has normal function. The left ventricle has no regional  wall motion abnormalities. There is mild left ventricular hypertrophy. The  average left ventricular global  longitudinal strain is -18.7 %. The global longitudinal strain is normal.   2. Right ventricular systolic function is normal. The right ventricular  size is normal.   3. The mitral valve is normal in structure. Trivial mitral valve  regurgitation.   4. S/p TAVR (23 mm Sapien prosthesis; procedure date 11/13/2019) Peak nad  mena gradients through the valve are 34 and 22 mm Hg respectively  Dimensionless valve index is 0.4 OVerall relatively unchanged from echo  done in October 2024. The aortic valve has   been repaired/replaced. Aortic valve regurgitation is not visualized.  There is a 23 mm Sapien prosthetic (TAVR) valve present in the aortic  position. Procedure Date: 11/13/2019.     Neuro/Psych CVA    GI/Hepatic ,GERD  ,,  Endo/Other    Renal/GU Renal disease     Musculoskeletal   Abdominal   Peds  Hematology   Anesthesia Other Findings   Reproductive/Obstetrics                              Anesthesia Physical Anesthesia Plan  ASA: 3  Anesthesia Plan: General   Post-op Pain Management:     Induction: Intravenous  PONV Risk Score and Plan: 2 and Ondansetron  and Dexamethasone   Airway Management Planned: Double Lumen EBT  Additional Equipment: Arterial line  Intra-op Plan:   Post-operative Plan: Extubation in OR  Informed Consent: I have reviewed the patients History and Physical, chart, labs and discussed the procedure including the risks, benefits and alternatives for the proposed anesthesia with the patient or authorized representative who has indicated his/her understanding and acceptance.     Dental advisory given  Plan Discussed with: CRNA  Anesthesia Plan Comments: (PAT note by Lynwood Hope, PA-C:  68 year old female follows with cardiology for history of HTN, severe AS s/p TAVR 10/2019.  Last seen by Dr. Delford on 04/06/2024.  Per note, bioprosthetic AVR gradients have been rising; mean gradient 28 mmHg by echo 07/2023.  CTA was ordered.  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 This does not correlate well with a prosthetic valve gradient of 28 mm Hg. Differential includes patient prosthesis mismatch or hyperdynamic function.  Dr. Delford commented on results stating, Not much thrombus on leaflets will f/u echo since she is asymptomatic and only start anticoagulation if gradients go up further.  Echo 07/2024 showed LVEF 65 to 70%, normal RV, TAVR with stable peak and mean gradients of 34 and 22 mmHg respectively.  Dr. Delford commented on results stating, EF normal gradients stable post TAVR repeat echo in a year.  Follows with  pulmonology for history of former smoker and pulmonary nodules.  Bronchoscopy 08/21/2024 with samples from superior segment left lower lobe in the inferior right upper lobe are positive for adenocarcinoma.  She was subsequently seen by Dr. Kerrin on 09/18/2024.  Per note, She very strongly wishes to have surgery. The plan would be to do the left lower lobe nodule first and then after a time for her  to recover with no backing to the right side. If she has complications or decides she does not want to do the right side surgically then she can be treated with stereotactic radiation.  Other pertinent history includes GERD on PPI, CVA (punctate infarct, likely secondary to small vessel disease, incidental finding), RCC s/p right nephrectomy.  Patient will need day of surgery labs and evaluation.  EKG 08/21/24: NSR. Rate 90  CT super D chest 06/22/24: IMPRESSION: 1. Mixed solid and ground-glass nodule of the dependent superior segment left lower lobe measures 1.6 x 1.2 cm, with a solid component measuring 0.6 cm. This is increased in size and conspicuity when compared to remote prior examination dated 08/25/2017, and although behavior has been very indolent over this long period of follow-up is worrisome for indolent adenocarcinoma. Given location and size of solid component, this may be amenable to metabolic characterization by PET-CT. 2. New ground-glass nodule of the anterior inferior right upper lobe measuring 1.0 x 0.8 cm. This is nonspecific and may be infectious or inflammatory but as above indolent adenocarcinoma not excluded. 3. Numerous additional small solid and ground-glass nodules not significantly changed when compared to examination dated 2018 and presumed benign. 4. Aortic valve stent endograft.  Coronary CTA 04/16/2024: IMPRESSION: 1. 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 This does not correlate well with a prosthetic valve gradient of 28 mm Hg. Differential includes patient prosthesis mismatch or hyperdynamic function.  TTE 08/16/2024: 1. Left ventricular ejection fraction, by estimation, is 65 to 70%. The  left ventricle has normal function. The left ventricle has no regional  wall motion abnormalities. There is mild left ventricular hypertrophy. The  average left ventricular global  longitudinal strain is -18.7 %. The global longitudinal  strain is normal.  2. Right ventricular systolic function is normal. The right ventricular  size is normal.  3. The mitral valve is normal in structure. Trivial mitral valve  regurgitation.  4. S/p TAVR (23 mm Sapien prosthesis; procedure date 11/13/2019) Peak nad  mena gradients through the valve are 34 and 22 mm Hg respectively  Dimensionless valve index is 0.4 OVerall relatively unchanged from echo  done in October 2024. The aortic valve has  been repaired/replaced. Aortic valve regurgitation is not visualized.  There is a 23 mm Sapien prosthetic (TAVR) valve present in the aortic  position. Procedure Date: 11/13/2019. )         Anesthesia Quick Evaluation

## 2024-10-04 NOTE — Progress Notes (Addendum)
 Anesthesia Chart Review:  68 year old female follows with cardiology for history of HTN, severe AS s/p TAVR 10/2019.  Last seen by Dr. Delford on 04/06/2024.  Per note, bioprosthetic AVR gradients have been rising; mean gradient 28 mmHg by echo 07/2023.  CTA was ordered.  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 This does not correlate well with a prosthetic valve gradient of 28 mm Hg. Differential includes patient prosthesis mismatch or hyperdynamic function.  Dr. Delford commented on results stating, Not much thrombus on leaflets will f/u echo since she is asymptomatic and only start anticoagulation if gradients go up further.  Echo 07/2024 showed LVEF 65 to 70%, normal RV, TAVR with stable peak and mean gradients of 34 and 22 mmHg respectively.  Dr. Delford commented on results stating, EF normal gradients stable post TAVR repeat echo in a year.   Follows with pulmonology for history of former smoker and pulmonary nodules.  Bronchoscopy 08/21/2024 with samples from superior segment left lower lobe in the inferior right upper lobe are positive for adenocarcinoma.  She was subsequently seen by Dr. Kerrin on 09/18/2024.  Per note, She very strongly wishes to have surgery. The plan would be to do the left lower lobe nodule first and then after a time for her to recover with no backing to the right side. If she has complications or decides she does not want to do the right side surgically then she can be treated with stereotactic radiation.  Other pertinent history includes GERD on PPI, CVA (punctate infarct, likely secondary to small vessel disease, incidental finding), RCC s/p right nephrectomy.   Patient will need day of surgery labs and evaluation.   EKG 08/21/24: NSR. Rate 90   CT super D chest 06/22/24: IMPRESSION: 1. Mixed solid and ground-glass nodule of the dependent superior segment left lower lobe measures 1.6 x 1.2 cm, with a solid component  measuring 0.6 cm. This is increased in size and conspicuity when compared to remote prior examination dated 08/25/2017, and although behavior has been very indolent over this long period of follow-up is worrisome for indolent adenocarcinoma. Given location and size of solid component, this may be amenable to metabolic characterization by PET-CT. 2. New ground-glass nodule of the anterior inferior right upper lobe measuring 1.0 x 0.8 cm. This is nonspecific and may be infectious or inflammatory but as above indolent adenocarcinoma not excluded. 3. Numerous additional small solid and ground-glass nodules not significantly changed when compared to examination dated 2018 and presumed benign. 4. Aortic valve stent endograft.  Coronary CTA 04/16/2024: IMPRESSION: 1. 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 This does not correlate well with a prosthetic valve gradient of 28 mm Hg. Differential includes patient prosthesis mismatch or hyperdynamic function.   TTE 08/16/2024: 1. Left ventricular ejection fraction, by estimation, is 65 to 70%. The  left ventricle has normal function. The left ventricle has no regional  wall motion abnormalities. There is mild left ventricular hypertrophy. The  average left ventricular global  longitudinal strain is -18.7 %. The global longitudinal strain is normal.   2. Right ventricular systolic function is normal. The right ventricular  size is normal.   3. The mitral valve is normal in structure. Trivial mitral valve  regurgitation.   4. S/p TAVR (23 mm Sapien prosthesis; procedure date 11/13/2019) Peak nad  mena gradients through the valve are 34 and 22 mm Hg respectively  Dimensionless valve index is 0.4 OVerall relatively  unchanged from echo  done in October 2024. The aortic valve has   been repaired/replaced. Aortic valve regurgitation is not visualized.  There is a 23 mm Sapien prosthetic (TAVR) valve present in the aortic  position.  Procedure Date: 11/13/2019.      Lynwood Geofm RIGGERS [facility] Short Stay Center/Anesthesiology Phone [phone] 10/04/2024 11:54 AM

## 2024-10-05 ENCOUNTER — Encounter (HOSPITAL_COMMUNITY)
Admission: RE | Disposition: A | Payer: Self-pay | Source: Home / Self Care | Attending: Thoracic Surgery (Cardiothoracic Vascular Surgery)

## 2024-10-05 ENCOUNTER — Inpatient Hospital Stay (HOSPITAL_COMMUNITY)
Admission: RE | Admit: 2024-10-05 | Discharge: 2024-10-07 | DRG: 164 | Disposition: A | Discharge status: Home / Self Care | Attending: Thoracic Surgery (Cardiothoracic Vascular Surgery) | Admitting: Thoracic Surgery (Cardiothoracic Vascular Surgery)

## 2024-10-05 ENCOUNTER — Encounter (HOSPITAL_COMMUNITY): Payer: Self-pay | Admitting: Thoracic Surgery (Cardiothoracic Vascular Surgery)

## 2024-10-05 ENCOUNTER — Inpatient Hospital Stay (HOSPITAL_COMMUNITY)

## 2024-10-05 ENCOUNTER — Other Ambulatory Visit: Payer: Self-pay

## 2024-10-05 ENCOUNTER — Inpatient Hospital Stay (HOSPITAL_COMMUNITY): Admitting: Certified Registered Nurse Anesthetist

## 2024-10-05 DIAGNOSIS — Z801 Family history of malignant neoplasm of trachea, bronchus and lung: Secondary | ICD-10-CM | POA: Diagnosis not present

## 2024-10-05 DIAGNOSIS — C3432 Malignant neoplasm of lower lobe, left bronchus or lung: Principal | ICD-10-CM | POA: Diagnosis present

## 2024-10-05 DIAGNOSIS — Z7982 Long term (current) use of aspirin: Secondary | ICD-10-CM

## 2024-10-05 DIAGNOSIS — Z9889 Other specified postprocedural states: Principal | ICD-10-CM

## 2024-10-05 DIAGNOSIS — I517 Cardiomegaly: Secondary | ICD-10-CM | POA: Diagnosis not present

## 2024-10-05 DIAGNOSIS — S0500XA Injury of conjunctiva and corneal abrasion without foreign body, unspecified eye, initial encounter: Secondary | ICD-10-CM | POA: Diagnosis not present

## 2024-10-05 DIAGNOSIS — J95811 Postprocedural pneumothorax: Secondary | ICD-10-CM | POA: Diagnosis not present

## 2024-10-05 DIAGNOSIS — Z888 Allergy status to other drugs, medicaments and biological substances status: Secondary | ICD-10-CM

## 2024-10-05 DIAGNOSIS — Z881 Allergy status to other antibiotic agents status: Secondary | ICD-10-CM | POA: Diagnosis not present

## 2024-10-05 DIAGNOSIS — X58XXXA Exposure to other specified factors, initial encounter: Secondary | ICD-10-CM | POA: Diagnosis not present

## 2024-10-05 DIAGNOSIS — J9811 Atelectasis: Secondary | ICD-10-CM | POA: Diagnosis not present

## 2024-10-05 DIAGNOSIS — Z8249 Family history of ischemic heart disease and other diseases of the circulatory system: Secondary | ICD-10-CM | POA: Diagnosis not present

## 2024-10-05 DIAGNOSIS — Z8673 Personal history of transient ischemic attack (TIA), and cerebral infarction without residual deficits: Secondary | ICD-10-CM

## 2024-10-05 DIAGNOSIS — Z885 Allergy status to narcotic agent status: Secondary | ICD-10-CM | POA: Diagnosis not present

## 2024-10-05 DIAGNOSIS — K219 Gastro-esophageal reflux disease without esophagitis: Secondary | ICD-10-CM | POA: Diagnosis present

## 2024-10-05 DIAGNOSIS — Z902 Acquired absence of lung [part of]: Secondary | ICD-10-CM

## 2024-10-05 DIAGNOSIS — Z85528 Personal history of other malignant neoplasm of kidney: Secondary | ICD-10-CM

## 2024-10-05 DIAGNOSIS — Z87891 Personal history of nicotine dependence: Secondary | ICD-10-CM | POA: Diagnosis not present

## 2024-10-05 DIAGNOSIS — I10 Essential (primary) hypertension: Secondary | ICD-10-CM | POA: Diagnosis present

## 2024-10-05 DIAGNOSIS — J90 Pleural effusion, not elsewhere classified: Secondary | ICD-10-CM | POA: Diagnosis not present

## 2024-10-05 DIAGNOSIS — Z803 Family history of malignant neoplasm of breast: Secondary | ICD-10-CM | POA: Diagnosis not present

## 2024-10-05 DIAGNOSIS — J939 Pneumothorax, unspecified: Secondary | ICD-10-CM | POA: Diagnosis not present

## 2024-10-05 DIAGNOSIS — Z953 Presence of xenogenic heart valve: Secondary | ICD-10-CM | POA: Diagnosis not present

## 2024-10-05 DIAGNOSIS — Z79899 Other long term (current) drug therapy: Secondary | ICD-10-CM | POA: Diagnosis not present

## 2024-10-05 DIAGNOSIS — H5711 Ocular pain, right eye: Secondary | ICD-10-CM | POA: Diagnosis not present

## 2024-10-05 DIAGNOSIS — Z4682 Encounter for fitting and adjustment of non-vascular catheter: Secondary | ICD-10-CM | POA: Diagnosis not present

## 2024-10-05 DIAGNOSIS — Z48813 Encounter for surgical aftercare following surgery on the respiratory system: Secondary | ICD-10-CM | POA: Diagnosis not present

## 2024-10-05 LAB — SURGICAL PCR SCREEN
MRSA, PCR: NEGATIVE
Staphylococcus aureus: NEGATIVE

## 2024-10-05 SURGERY — RESECTION, LUNG, SEGMENTAL, ROBOT-ASSISTED
Anesthesia: General | Site: Chest | Laterality: Left

## 2024-10-05 MED ORDER — ONDANSETRON HCL 4 MG/2ML IJ SOLN: 4.0000 mg | Freq: Once | INTRAMUSCULAR | Status: DC | PRN

## 2024-10-05 MED ORDER — PROPOFOL 10 MG/ML IV BOLUS
INTRAVENOUS | Status: AC
  Filled 2024-10-05: qty 20

## 2024-10-05 MED ORDER — ASPIRIN 81 MG PO TBEC
81.0000 mg | DELAYED_RELEASE_TABLET | Freq: Every day | ORAL | Status: DC
  Administered 2024-10-06 – 2024-10-07 (×2): 81 mg via ORAL
  Filled 2024-10-05 (×2): qty 1

## 2024-10-05 MED ORDER — CEFAZOLIN SODIUM-DEXTROSE 2-4 GM/100ML-% IV SOLN
2.0000 g | INTRAVENOUS | Status: AC
  Administered 2024-10-05: 2 g via INTRAVENOUS
  Filled 2024-10-05: qty 100

## 2024-10-05 MED ORDER — LACTATED RINGERS IV SOLN: INTRAVENOUS | Status: DC | PRN

## 2024-10-05 MED ORDER — FENTANYL CITRATE (PF) 50 MCG/ML IJ SOSY: 25.0000 ug | PREFILLED_SYRINGE | INTRAMUSCULAR | Status: DC | PRN

## 2024-10-05 MED ORDER — KETOROLAC TROMETHAMINE 15 MG/ML IJ SOLN
15.0000 mg | Freq: Four times a day (QID) | INTRAMUSCULAR | Status: AC
  Administered 2024-10-05 – 2024-10-07 (×7): 15 mg via INTRAVENOUS
  Filled 2024-10-05 (×7): qty 1

## 2024-10-05 MED ORDER — PROPOFOL 10 MG/ML IV BOLUS
INTRAVENOUS | Status: DC | PRN
  Administered 2024-10-05: 120 mg via INTRAVENOUS

## 2024-10-05 MED ORDER — ONDANSETRON HCL 4 MG/2ML IJ SOLN: 4.0000 mg | Freq: Four times a day (QID) | INTRAMUSCULAR | Status: DC | PRN

## 2024-10-05 MED ORDER — SODIUM CHLORIDE FLUSH 0.9 % IV SOLN
INTRAVENOUS | Status: DC | PRN
  Administered 2024-10-05: 100 mL

## 2024-10-05 MED ORDER — LACTATED RINGERS IV SOLN: INTRAVENOUS | Status: DC

## 2024-10-05 MED ORDER — ORAL CARE MOUTH RINSE: 15.0000 mL | Freq: Once | OROMUCOSAL | Status: AC

## 2024-10-05 MED ORDER — SODIUM CHLORIDE 0.45 % IV SOLN: INTRAVENOUS | Status: DC

## 2024-10-05 MED ORDER — OXYCODONE HCL 5 MG PO TABS: 5.0000 mg | ORAL_TABLET | Freq: Once | ORAL | Status: DC | PRN

## 2024-10-05 MED ORDER — OXYCODONE HCL 5 MG PO TABS
5.0000 mg | ORAL_TABLET | ORAL | Status: DC | PRN
  Administered 2024-10-05 – 2024-10-06 (×2): 10 mg via ORAL
  Filled 2024-10-05 (×2): qty 2

## 2024-10-05 MED ORDER — CHLORHEXIDINE GLUCONATE 0.12 % MT SOLN
OROMUCOSAL | Status: AC
  Administered 2024-10-05: 15 mL via OROMUCOSAL
  Filled 2024-10-05: qty 15

## 2024-10-05 MED ORDER — ATORVASTATIN CALCIUM 40 MG PO TABS
40.0000 mg | ORAL_TABLET | Freq: Every day | ORAL | Status: DC
  Administered 2024-10-06 – 2024-10-07 (×2): 40 mg via ORAL
  Filled 2024-10-05 (×2): qty 1

## 2024-10-05 MED ORDER — SODIUM CHLORIDE (PF) 0.9 % IJ SOLN
INTRAMUSCULAR | Status: AC
  Filled 2024-10-05: qty 50

## 2024-10-05 MED ORDER — INDIGOTINDISULFONATE SODIUM 8 MG/ML IJ SOLN
INTRAMUSCULAR | Status: DC | PRN
  Administered 2024-10-05: 25 mg via INTRAVENOUS

## 2024-10-05 MED ORDER — PHENYLEPHRINE 80 MCG/ML (10ML) SYRINGE FOR IV PUSH (FOR BLOOD PRESSURE SUPPORT)
PREFILLED_SYRINGE | INTRAVENOUS | Status: AC
  Filled 2024-10-05: qty 10

## 2024-10-05 MED ORDER — MIDAZOLAM HCL 5 MG/5ML IJ SOLN
INTRAMUSCULAR | Status: DC | PRN
  Administered 2024-10-05: 2 mg via INTRAVENOUS

## 2024-10-05 MED ORDER — METOCLOPRAMIDE HCL 5 MG/ML IJ SOLN
10.0000 mg | Freq: Four times a day (QID) | INTRAMUSCULAR | Status: AC
  Administered 2024-10-05 – 2024-10-06 (×3): 10 mg via INTRAVENOUS
  Filled 2024-10-05 (×3): qty 2

## 2024-10-05 MED ORDER — METOPROLOL SUCCINATE ER 50 MG PO TB24
50.0000 mg | ORAL_TABLET | Freq: Every day | ORAL | Status: DC
  Administered 2024-10-06: 50 mg via ORAL
  Filled 2024-10-05: qty 1

## 2024-10-05 MED ORDER — OXYCODONE HCL 5 MG/5ML PO SOLN: 5.0000 mg | Freq: Once | ORAL | Status: DC | PRN

## 2024-10-05 MED ORDER — GABAPENTIN 300 MG PO CAPS
300.0000 mg | ORAL_CAPSULE | Freq: Every day | ORAL | Status: DC
  Administered 2024-10-05 – 2024-10-06 (×2): 300 mg via ORAL
  Filled 2024-10-05 (×2): qty 1

## 2024-10-05 MED ORDER — MAGNESIUM OXIDE -MG SUPPLEMENT 400 (240 MG) MG PO TABS
400.0000 mg | ORAL_TABLET | Freq: Every day | ORAL | Status: DC
  Administered 2024-10-06 – 2024-10-07 (×2): 400 mg via ORAL
  Filled 2024-10-05 (×3): qty 1

## 2024-10-05 MED ORDER — ACETAMINOPHEN 500 MG PO TABS
1000.0000 mg | ORAL_TABLET | Freq: Four times a day (QID) | ORAL | Status: DC
  Administered 2024-10-05 – 2024-10-07 (×7): 1000 mg via ORAL
  Filled 2024-10-05 (×7): qty 2

## 2024-10-05 MED ORDER — NON FORMULARY: 20.0000 mg | Freq: Every day | Status: DC

## 2024-10-05 MED ORDER — SODIUM CHLORIDE 0.9 % IR SOLN
Status: DC | PRN
  Administered 2024-10-05: 1000 mL

## 2024-10-05 MED ORDER — BSS IO SOLN
15.0000 mL | Freq: Once | INTRAOCULAR | Status: AC
  Administered 2024-10-05: 15 mL

## 2024-10-05 MED ORDER — ACETAMINOPHEN 160 MG/5ML PO SOLN: 1000.0000 mg | Freq: Four times a day (QID) | ORAL | Status: DC

## 2024-10-05 MED ORDER — KETOROLAC TROMETHAMINE 0.5 % OP SOLN
1.0000 [drp] | Freq: Four times a day (QID) | OPHTHALMIC | Status: DC
  Administered 2024-10-05 – 2024-10-07 (×6): 1 [drp] via OPHTHALMIC
  Filled 2024-10-05: qty 5

## 2024-10-05 MED ORDER — EPHEDRINE SULFATE-NACL 50-0.9 MG/10ML-% IV SOSY
PREFILLED_SYRINGE | INTRAVENOUS | Status: DC | PRN
  Administered 2024-10-05 (×3): 5 mg via INTRAVENOUS

## 2024-10-05 MED ORDER — GABAPENTIN 300 MG PO CAPS: 300.0000 mg | ORAL_CAPSULE | Freq: Two times a day (BID) | ORAL | Status: DC

## 2024-10-05 MED ORDER — ONDANSETRON HCL 4 MG/2ML IJ SOLN
INTRAMUSCULAR | Status: DC | PRN
  Administered 2024-10-05: 4 mg via INTRAVENOUS

## 2024-10-05 MED ORDER — ROCURONIUM BROMIDE 10 MG/ML (PF) SYRINGE
PREFILLED_SYRINGE | INTRAVENOUS | Status: DC | PRN
  Administered 2024-10-05: 20 mg via INTRAVENOUS
  Administered 2024-10-05: 30 mg via INTRAVENOUS
  Administered 2024-10-05: 70 mg via INTRAVENOUS

## 2024-10-05 MED ORDER — BUPIVACAINE HCL (PF) 0.5 % IJ SOLN
INTRAMUSCULAR | Status: AC
  Filled 2024-10-05: qty 30

## 2024-10-05 MED ORDER — FENTANYL CITRATE (PF) 100 MCG/2ML IJ SOLN: 25.0000 ug | INTRAMUSCULAR | Status: DC | PRN

## 2024-10-05 MED ORDER — ONDANSETRON HCL 4 MG/2ML IJ SOLN
INTRAMUSCULAR | Status: AC
  Filled 2024-10-05: qty 2

## 2024-10-05 MED ORDER — BISACODYL 5 MG PO TBEC
10.0000 mg | DELAYED_RELEASE_TABLET | Freq: Every day | ORAL | Status: DC
  Administered 2024-10-05 – 2024-10-06 (×2): 10 mg via ORAL
  Filled 2024-10-05 (×2): qty 2

## 2024-10-05 MED ORDER — BSS IO SOLN
INTRAOCULAR | Status: AC
  Filled 2024-10-05: qty 15

## 2024-10-05 MED ORDER — PHENYLEPHRINE 80 MCG/ML (10ML) SYRINGE FOR IV PUSH (FOR BLOOD PRESSURE SUPPORT)
PREFILLED_SYRINGE | INTRAVENOUS | Status: DC | PRN
  Administered 2024-10-05: 40 ug via INTRAVENOUS
  Administered 2024-10-05 (×4): 80 ug via INTRAVENOUS
  Administered 2024-10-05: 40 ug via INTRAVENOUS
  Administered 2024-10-05 (×2): 80 ug via INTRAVENOUS

## 2024-10-05 MED ORDER — DEXAMETHASONE SOD PHOSPHATE PF 10 MG/ML IJ SOLN
INTRAMUSCULAR | Status: DC | PRN
  Administered 2024-10-05: 10 mg via INTRAVENOUS

## 2024-10-05 MED ORDER — KETOROLAC TROMETHAMINE 0.5 % OP SOLN
OPHTHALMIC | Status: AC
  Filled 2024-10-05: qty 5

## 2024-10-05 MED ORDER — MIDAZOLAM HCL 2 MG/2ML IJ SOLN
INTRAMUSCULAR | Status: AC
  Filled 2024-10-05: qty 2

## 2024-10-05 MED ORDER — PHENYLEPHRINE HCL-NACL 20-0.9 MG/250ML-% IV SOLN
INTRAVENOUS | Status: DC | PRN
  Administered 2024-10-05: 15 ug/min via INTRAVENOUS

## 2024-10-05 MED ORDER — SUGAMMADEX SODIUM 200 MG/2ML IV SOLN
INTRAVENOUS | Status: DC | PRN
  Administered 2024-10-05: 200 mg via INTRAVENOUS

## 2024-10-05 MED ORDER — LIDOCAINE 2% (20 MG/ML) 5 ML SYRINGE
INTRAMUSCULAR | Status: DC | PRN
  Administered 2024-10-05: 100 mg via INTRAVENOUS

## 2024-10-05 MED ORDER — FENTANYL CITRATE (PF) 250 MCG/5ML IJ SOLN
INTRAMUSCULAR | Status: DC | PRN
  Administered 2024-10-05 (×5): 50 ug via INTRAVENOUS

## 2024-10-05 MED ORDER — KETOROLAC TROMETHAMINE 0.5 % OP SOLN
1.0000 [drp] | Freq: Four times a day (QID) | OPHTHALMIC | Status: DC
  Administered 2024-10-05: 1 [drp] via OPHTHALMIC

## 2024-10-05 MED ORDER — BUPIVACAINE LIPOSOME 1.3 % IJ SUSP
INTRAMUSCULAR | Status: AC
  Filled 2024-10-05: qty 20

## 2024-10-05 MED ORDER — ACETAMINOPHEN 10 MG/ML IV SOLN: 1000.0000 mg | Freq: Once | INTRAVENOUS | Status: DC | PRN

## 2024-10-05 MED ORDER — SUGAMMADEX SODIUM 200 MG/2ML IV SOLN
INTRAVENOUS | Status: AC
  Filled 2024-10-05: qty 2

## 2024-10-05 MED ORDER — ROCURONIUM BROMIDE 10 MG/ML (PF) SYRINGE
PREFILLED_SYRINGE | INTRAVENOUS | Status: AC
  Filled 2024-10-05: qty 10

## 2024-10-05 MED ORDER — TRAMADOL HCL 50 MG PO TABS: 50.0000 mg | ORAL_TABLET | Freq: Four times a day (QID) | ORAL | Status: DC | PRN

## 2024-10-05 MED ORDER — ENOXAPARIN SODIUM 40 MG/0.4ML IJ SOSY
40.0000 mg | PREFILLED_SYRINGE | Freq: Every day | INTRAMUSCULAR | Status: DC
  Administered 2024-10-06 – 2024-10-07 (×2): 40 mg via SUBCUTANEOUS
  Filled 2024-10-05 (×2): qty 0.4

## 2024-10-05 MED ORDER — OMEPRAZOLE 20 MG PO CPDR
20.0000 mg | DELAYED_RELEASE_CAPSULE | Freq: Every day | ORAL | Status: DC
  Administered 2024-10-06 – 2024-10-07 (×2): 20 mg via ORAL
  Filled 2024-10-05 (×2): qty 1

## 2024-10-05 MED ORDER — CEFAZOLIN SODIUM-DEXTROSE 2-4 GM/100ML-% IV SOLN
2.0000 g | Freq: Three times a day (TID) | INTRAVENOUS | Status: AC
  Administered 2024-10-05 (×2): 2 g via INTRAVENOUS
  Filled 2024-10-05 (×2): qty 100

## 2024-10-05 MED ORDER — ALBUMIN HUMAN 5 % IV SOLN: INTRAVENOUS | Status: DC | PRN

## 2024-10-05 MED ORDER — CHLORHEXIDINE GLUCONATE 0.12 % MT SOLN: 15.0000 mL | Freq: Once | OROMUCOSAL | Status: AC

## 2024-10-05 MED ORDER — SENNOSIDES-DOCUSATE SODIUM 8.6-50 MG PO TABS
1.0000 | ORAL_TABLET | Freq: Every day | ORAL | Status: DC
  Administered 2024-10-05: 1 via ORAL
  Filled 2024-10-05 (×2): qty 1

## 2024-10-05 MED ORDER — FENTANYL CITRATE (PF) 250 MCG/5ML IJ SOLN
INTRAMUSCULAR | Status: AC
  Filled 2024-10-05: qty 5

## 2024-10-05 MED ORDER — LORATADINE 10 MG PO TABS
10.0000 mg | ORAL_TABLET | Freq: Every day | ORAL | Status: DC
  Administered 2024-10-06 – 2024-10-07 (×2): 10 mg via ORAL
  Filled 2024-10-05 (×2): qty 1

## 2024-10-05 MED ADMIN — HEMOSTATIC AGENTS (NO CHARGE) OPTIME: 1 | TOPICAL | NDC 99999080054

## 2024-10-05 MED ADMIN — Sodium Chloride Irrigation Soln 0.9%: 2000 mL | NDC 99999050048

## 2024-10-05 MED FILL — Ephedrine Sulf-NaCl PF Pref Syr 50 MG/10ML-0.9% (5 MG/ML): INTRAVENOUS | Qty: 5 | Status: AC

## 2024-10-05 MED FILL — Lidocaine HCl Local Soln Prefilled Syringe 100 MG/5ML (2%): INTRAMUSCULAR | Qty: 5 | Status: AC

## 2024-10-05 SURGICAL SUPPLY — 70 items
BLADE CLIPPER SURG (BLADE) ×2 IMPLANT
CANISTER SUCTION 3000ML PPV (SUCTIONS) ×4 IMPLANT
CANNULA REDUCER 12-8 DVNC XI (CANNULA) ×4 IMPLANT
CLIP APPLIE ROT 10 11.4 M/L (STAPLE) IMPLANT
CLIP TI MEDIUM 6 (CLIP) IMPLANT
CLIP TI WIDE RED SMALL 6 (CLIP) IMPLANT
CNTNR URN SCR LID CUP LEK RST (MISCELLANEOUS) ×10 IMPLANT
CONN ST 1/4X3/8 BEN (MISCELLANEOUS) IMPLANT
DEFOGGER SCOPE WARM SEASHARP (MISCELLANEOUS) ×2 IMPLANT
DERMABOND ADVANCED .7 DNX12 (GAUZE/BANDAGES/DRESSINGS) ×2 IMPLANT
DRAIN CHANNEL 28F RND 3/8 FF (WOUND CARE) IMPLANT
DRAPE ARM DVNC X/XI (DISPOSABLE) ×8 IMPLANT
DRAPE COLUMN DVNC XI (DISPOSABLE) ×2 IMPLANT
DRAPE CV SPLIT W-CLR ANES SCRN (DRAPES) ×2 IMPLANT
DRAPE HALF SHEET 40X57 (DRAPES) ×2 IMPLANT
DRAPE INCISE IOBAN 66X45 STRL (DRAPES) IMPLANT
DRAPE SURG ORHT 6 SPLT 77X108 (DRAPES) ×2 IMPLANT
ELECT BLADE 6.5 EXT (BLADE) IMPLANT
ELECTRODE REM PT RTRN 9FT ADLT (ELECTROSURGICAL) ×2 IMPLANT
FORCEPS BPLR FENES DVNC XI (FORCEP) IMPLANT
FORCEPS BPLR LNG DVNC XI (INSTRUMENTS) IMPLANT
GAUZE KITTNER 4X5 RF (MISCELLANEOUS) IMPLANT
GAUZE SPONGE 4X4 12PLY STRL (GAUZE/BANDAGES/DRESSINGS) ×2 IMPLANT
GAUZE XEROFORM 1X8 LF (GAUZE/BANDAGES/DRESSINGS) IMPLANT
GLOVE SS BIOGEL STRL SZ 7.5 (GLOVE) ×2 IMPLANT
GOWN STRL REUS W/ TWL LRG LVL3 (GOWN DISPOSABLE) ×4 IMPLANT
GOWN STRL REUS W/ TWL XL LVL3 (GOWN DISPOSABLE) ×4 IMPLANT
GOWN STRL REUS W/TWL 2XL LVL3 (GOWN DISPOSABLE) ×2 IMPLANT
GRASPER TIP-UP FEN DVNC XI (INSTRUMENTS) IMPLANT
HEMOSTAT SURGICEL 2X14 (HEMOSTASIS) ×8 IMPLANT
IRRIGATION STRYKERFLOW (MISCELLANEOUS) ×2 IMPLANT
KIT BASIN OR (CUSTOM PROCEDURE TRAY) ×2 IMPLANT
NDL HYPO 25GX1X1/2 BEV (NEEDLE) ×2 IMPLANT
NEEDLE HYPO 25GX1X1/2 BEV (NEEDLE) ×2 IMPLANT
PACK CHEST (CUSTOM PROCEDURE TRAY) ×2 IMPLANT
PAD ARMBOARD POSITIONER FOAM (MISCELLANEOUS) ×4 IMPLANT
RELOAD STAPLE 45 2.0 GRY DVNC (STAPLE) IMPLANT
RELOAD STAPLE 45 2.5 WHT DVNC (STAPLE) IMPLANT
RELOAD STAPLE 45 3.5 BLU DVNC (STAPLE) IMPLANT
RELOAD STAPLE 45 4.3 GRN DVNC (STAPLE) IMPLANT
RELOAD STAPLE 45 4.6 BLK DVNC (STAPLE) IMPLANT
SCISSORS LAP 5X35 DISP (ENDOMECHANICALS) IMPLANT
SEAL UNIV 5-12 XI (MISCELLANEOUS) ×8 IMPLANT
SET TRI-LUMEN FLTR TB AIRSEAL (TUBING) ×2 IMPLANT
SHEARS HARMONIC HDI 20CM (ELECTROSURGICAL) IMPLANT
SOLN 0.9% NACL POUR BTL 1000ML (IV SOLUTION) ×2 IMPLANT
SOLN STERILE WATER BTL 1000 ML (IV SOLUTION) ×2 IMPLANT
SOLUTION ELECTROSURG ANTI STCK (MISCELLANEOUS) ×2 IMPLANT
SPONGE INTESTINAL PEANUT (DISPOSABLE) IMPLANT
SPONGE TONSIL 1 RF SGL (DISPOSABLE) IMPLANT
STAPLER 45 SUREFORM CVD DVNC (STAPLE) IMPLANT
STAPLER 45 SUREFORM DVNC (STAPLE) IMPLANT
SUT PDS AB 3-0 SH 27 (SUTURE) IMPLANT
SUT PROLENE 4-0 RB1 .5 CRCL 36 (SUTURE) IMPLANT
SUT SILK 1 MH (SUTURE) ×4 IMPLANT
SUT SILK 2 0 SH (SUTURE) IMPLANT
SUT SILK 2 0SH CR/8 30 (SUTURE) IMPLANT
SUT SILK 3 0SH CR/8 30 (SUTURE) IMPLANT
SUT VIC AB 1 CTX36XBRD ANBCTR (SUTURE) IMPLANT
SUT VIC AB 2-0 CTX 36 (SUTURE) IMPLANT
SUT VIC AB 3-0 X1 27 (SUTURE) ×2 IMPLANT
SUT VICRYL 0 TIES 12 18 (SUTURE) ×2 IMPLANT
SUT VICRYL 0 UR6 27IN ABS (SUTURE) ×4 IMPLANT
SYR 20CC LL (SYRINGE) ×4 IMPLANT
SYSTEM RETRIEVAL ANCHOR 12 (MISCELLANEOUS) IMPLANT
SYSTEM SAHARA CHEST DRAIN ATS (WOUND CARE) ×2 IMPLANT
TAPE CLOTH 4X10 WHT NS (GAUZE/BANDAGES/DRESSINGS) ×2 IMPLANT
TAPE CLOTH SURG 4X10 WHT LF (GAUZE/BANDAGES/DRESSINGS) IMPLANT
TOWEL GREEN STERILE (TOWEL DISPOSABLE) ×2 IMPLANT
TRAY FOLEY MTR SLVR 16FR STAT (SET/KITS/TRAYS/PACK) ×2 IMPLANT

## 2024-10-05 NOTE — Hospital Course (Addendum)
 History of Present Illness:  Morelia Dirk is a 68 year old woman with a history of tobacco use, aortic stenosis, status post TAVR, renal cell carcinoma, cerebellar stroke, reflux, hypertension, and numerous pulmonary nodules.  She has smoked on and off since the 1970s.  Never more than about a half a pack a day.  Quit smoking about 20 years ago.   Her mother and sister both died of aggressive cancers.  She was concerned about the possibility of lung cancer and her primary care ordered a CT of the chest.  She was noted to have numerous pulmonary nodules.  Those were followed.  On a recent scan left lower lobe nodule increased in size and solid component and there was a new suspicious right upper lobe nodule.  PET/CT showed the left upper lobe nodule was hypermetabolic.  No mediastinal or hilar adenopathy.  Most of the pulmonary nodules were too small to characterize.   Dr. Shelah performed robotic bronchoscopy.  Samples from the superior segment of the left lower lobe and the inferior right upper lobe were positive for adenocarcinoma.     She is retired.  Remains active.  Denies any chest pain, pressure, tightness, shortness of breath, change in appetite, weight loss, headaches, visual changes, or new bone or joint pain.  Dr. Kerrin reviewed the patient's diagnostic studies and determined she would benefit from surgical intervention. He reviewed the patient's treatment options as well as the risks and benefits of surgery with the patient. Ms. Prehn was agreeable to proceed with surgery.   Hospital Course:   Ms. Gerke presented to [facility] and was brought to the operating room on 10/05/24. She underwent Robotic Assisted Left Video Thoracoscopy with Superior Segmentectomy of left lower lobe, lymph node dissection, intercostal nerve block, and lysis of adhesions. She tolerated the procedure well and was transferred to the PACU in stable condition. She had a small left apical pneumothorax  on CXR which improved with time. She had tidaling but no air leak present on POD1 with chest tube to water seal. Chest tube was removed without complication on POD1. Follow up CXR showed a stable small left apical pneumothorax. Oxygen was weaned to room air on POD1. She was ambulating well on RA and tolerating a diet. Her incisions were healing well without sign of infection. She was felt stable for discharge home.

## 2024-10-05 NOTE — Discharge Instructions (Signed)
 Discharge Instructions:  1. You may shower, please wash incisions daily with soap and water and keep dry.  If you wish to cover wounds with dressing you may do so but please keep clean and change daily.  No tub baths or swimming until incisions have completely healed.  If your incisions become red or develop any drainage please call our office at [phone]  2. No Driving until cleared by Dr. Audree Bless office and you are no longer using narcotic pain medications  3. Monitor your weight daily.. Please use the same scale and weigh at same time... If you gain 5-10 lbs in 48 hours with associated lower extremity swelling, please contact our office at [phone]  4. Fever of 101.5 for at least 24 hours with no source, please contact our office at [phone]  5. Activity- up as tolerated, please walk at least 3 times per day.  Avoid strenuous activity, no lifting, pushing, or pulling with your arms over 8-10 lbs for a minimum of 6 weeks  6. If any questions or concerns arise, please do not hesitate to contact our office at [phone]

## 2024-10-05 NOTE — Anesthesia Procedure Notes (Signed)
 Procedure Name: Intubation Date/Time: 10/05/2024 8:32 AM  Performed by: Claudene Arlin LABOR, CRNAPre-anesthesia Checklist: Patient identified, Emergency Drugs available, Suction available and Patient being monitored Patient Re-evaluated:Patient Re-evaluated prior to induction Oxygen Delivery Method: Circle system utilized Preoxygenation: Pre-oxygenation with 100% oxygen Induction Type: IV induction Ventilation: Mask ventilation without difficulty Laryngoscope Size: Miller and 2 Grade View: Grade I Endobronchial tube: Left, Double lumen EBT, EBT position confirmed by auscultation and EBT position confirmed by fiberoptic bronchoscope and 37 Fr Number of attempts: 1 Airway Equipment and Method: Stylet Placement Confirmation: ETT inserted through vocal cords under direct vision, positive ETCO2 and breath sounds checked- equal and bilateral Secured at: 28 cm Tube secured with: Tape Dental Injury: Teeth and Oropharynx as per pre-operative assessment

## 2024-10-05 NOTE — Op Note (Unsigned)
 NAME: [name], [name]. MEDICAL RECORD NO: [id number] ACCOUNT NO: [id number] DATE OF BIRTH: Mar 06, 1956 FACILITY: [facility] LOCATION: [facility] PHYSICIAN: Elspeth BROCKS. 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  PROCEDURE:  Xi robotic assisted left lower lobe superior segmentectomy, lymph node sampling, and intercostal nerve blocks levels 3-10.  SURGEON:  Elspeth BROCKS. Kerrin, MD  ASSISTANT:  Rocky Shad, PA.  Experienced assistance was necessary for this case due to surgical complexity.  Erin Barrett assisted with port placement, robot docking and undocking, instrument exchange, specimen retrieval, suctioning, and wound closure.  ANESTHESIA:  General.  FINDINGS:  Nodule clearly palpable, closest parenchymal margin and bronchial margin negative for tumor.  Adhesions within the chest, predominantly thin filmy adhesions, but did limit lymph node dissection.  CLINICAL NOTE:  The patient is a 68 year old woman with a history of tobacco use who recently was found to have multiple pulmonary nodules.  She was found to have suspicious nodules in the superior segment of the left lower lobe and the inferior portion  of the right upper lobe.  Robotic bronchoscopy and biopsy showed both were positive for adenocarcinoma.  She was offered the option of surgery versus stereotactic radiation.  She wished to proceed with surgery.  Plan was to do a superior segmentectomy  and then potentially in the future resect the right upper lobe nodule.  The indications, risks, benefits, and alternatives were discussed in detail with the patient.  She understood and accepted the risks and agreed to proceed.  OPERATIVE NOTE:  The patient was brought to the operating room on 10/05/2024.  She had  induction of general anesthesia and was intubated with a double-lumen endotracheal tube.  Intravenous antibiotics were administered.  A Foley catheter was placed.   Sequential compression devices were placed on the calves for DVT prophylaxis.  She was placed in a right lateral decubitus position.  A Bair Hugger was placed for active warming.  The left chest was prepped and draped in the usual sterile fashion.    A time-out was performed.  A solution containing 20 mL of liposomal bupivacaine and 30 mL of 0.5% bupivacaine and 50 mL of saline is prepared.  This solution was used for local at the incisions as well as for the intercostal nerve blocks.  Incision was  made in the eighth interspace in the midaxillary line and an 8 mm robotic port was inserted.  The thoracoscope was advanced in the chest.  Immediately noted that there were significant adhesions within the chest.  Carbon dioxide was insufflated per  protocol.  A 12 mm robotic port was placed in the eighth interspace anterior to the camera port.  Intercostal nerve blocks were initiated and we were able to inject 10 mL of the bupivacaine solution into a subpleural plane at each level from the 3rd to  the 6th interspace.  The adhesions then were in the way.  A 12 mm AirSeal port was placed in the 10th interspace posterolaterally and two additional eighth interspace robotic ports were placed.  Electrocautery was used to take down additional adhesions  and the intercostal nerve blocks from levels 7-10 were performed again injecting 10 mL of the bupivacaine solution.  The robot was deployed, the camera arm was docked.  Targeting was performed, the remaining arms were docked.  The robotic instruments  were inserted with thoracoscopic visualization.  Additional adhesions were taken down.  The inferior pulmonary ligament was identified.  It was divided with bipolar cautery.  Level 9 lymph node was removed.  The lung then was retracted anteriorly.  The  pleural reflection was divided at the hilum  posteriorly.  Level 10 and level 7 nodes were removed.  The level 7 node was rather large but otherwise benign in appearance.  Due to adhesions, the aortopulmonary window was not dissected out.  Attention then was turned anteriorly.  Additional adhesions  were taken down.  Care was taken not to use cautery in the vicinity of the phrenic nerve.  The fissure was incomplete.  Division of the fissure was begun using the robotic stapler, initially using blue and then green cartridges.  As the hilum was  approached, the pulmonary artery was identified and a level 11 node was removed.  The remainder of the fissure anteriorly was encircled with a vessel loop and divided with the stapler.  The dissection then was carried along the pulmonary artery and the  fissure was completed with sequential firings of the stapler.  This required multiple firings.  There was a large superior segmental branch of the pulmonary artery to the superior segment and then there was a smaller aberrant branch arising from the  anterior portion of the basilar segmental trunk which also supplied the superior segment.  The larger branch was encircled and divided with the robotic stapler and the then the smaller branch was encircled and divided.  A gray cartridge was used for that  but there still was some bleeding given the small size of the branch and clips were applied.  The superior segmental bronchus then was dissected out.  The stapler was placed across the superior segmental bronchus and closed.  Test inflation showed good  aeration of the upper lobe and the remainder of the lower lobe.  The stapler was fired, transecting the bronchus.  The segmentectomy then was completed with sequential firings of the robotic stapler using green and black cartridges.  10 mL of intravenous  ICG was administered.  There was good correlation of the demarcation from inflation and from the ICG.  The segmentectomy  was completed biasing to remove small portion of the basilar segments along with the entire superior segment.  The vessel loop and sponges used during the dissection were removed.  The chest was copiously irrigated with the saline.  A test inflation to 30 cm of water pressure revealed no air leakage from the bronchial stump or the parenchyma.  The robotic  instruments were removed and the robot was undocked.  The anterior eighth interspace was lengthened to approximately 3 cm.  A 12 mm endoscopic retrieval bag was placed in the chest.  The specimen was placed into the bag and removed.  It was sent for  frozen section of the bronchial and closest staple margins, both of which returned with no tumor seen.  The nodule was clearly palpable within the specimen and the frozen section was not performed on it as we already had a diagnosis.  Final inspection  was made for hemostasis.  A 28 French chest tube was placed through the original port incision to direct to the apex.  It was secured with a #1 silk suture.  Dual lung ventilation was resumed.  The incisions were closed in standard fashion.  All sponge,  needle, and instrument counts were correct at the end of the procedure.  The patient was transported from the operating room to the surgical intensive care unit.  The chest tube was placed to a Pleur-evac on water seal.  The patient was placed back in a  supine position.  She was extubated in the operating room and taken to the post-anesthesia care unit in good condition.  All sponge, needle, and instrument counts were correct at the end of the procedure.    SUJ D: 10/05/2024 6:30:29 pm T: 10/05/2024 10:33:00 pm  JOB: [id number]/ [id number]

## 2024-10-05 NOTE — Plan of Care (Signed)

## 2024-10-05 NOTE — Transfer of Care (Signed)
 Immediate Anesthesia Transfer of Care Note  Patient: [name]  Procedure(s) Performed: LUNG RESECTION, LEFT LOWER LOBE, SEGMENTAL, ROBOT-ASSISTED (Left: Chest) BLOCK, NERVE, INTERCOSTAL (Chest) LYMPH NODE BIOPSY (Chest)  Patient Location: PACU  Anesthesia Type:General  Level of Consciousness: drowsy  Airway & Oxygen Therapy: Patient Spontanous Breathing and Patient connected to face mask oxygen  Post-op Assessment: Report given to RN and Post -op Vital signs reviewed and stable  Post vital signs: Reviewed and stable  Last Vitals:  Vitals Value Taken Time  BP 96/49 10/05/24 11:50  Temp    Pulse 83 10/05/24 11:53  Resp 12 10/05/24 11:53  SpO2 100 % 10/05/24 11:53  Vitals shown include unfiled device data.  Last Pain:  Vitals:   10/05/24 0740  TempSrc:   PainSc: 0-No pain         Complications: No notable events documented.

## 2024-10-05 NOTE — Anesthesia Postprocedure Evaluation (Signed)
 Anesthesia Post Note  Patient: [name]  Procedure(s) Performed: LUNG RESECTION, LEFT LOWER LOBE, SEGMENTAL, ROBOT-ASSISTED (Left: Chest) BLOCK, NERVE, INTERCOSTAL (Chest) LYMPH NODE BIOPSY (Chest)     Patient location during evaluation: PACU Anesthesia Type: General Level of consciousness: awake and alert Pain management: pain level controlled Vital Signs Assessment: post-procedure vital signs reviewed and stable Respiratory status: spontaneous breathing, nonlabored ventilation, respiratory function stable and patient connected to nasal cannula oxygen Cardiovascular status: blood pressure returned to baseline and stable Postop Assessment: no apparent nausea or vomiting Anesthetic complications: yes Comments: C/o right eye pain in PACU. Assessed, red eye appears injected/inflamed, no visible foreign body. Pupillary reflex intact. She reports vision blurred. Suspect corneal abrasion to right eye. Saline flush and ketorolac drops ordered and administered by PACU.    No notable events documented.  Last Vitals:  Vitals:   10/05/24 1352 10/05/24 1614  BP: (!) 90/51 101/62  Pulse: 85 86  Resp: 16 16  Temp: 36.6 C 36.6 C  SpO2: 90% 96%    Last Pain:  Vitals:   10/05/24 1614  TempSrc: Oral  PainSc:                  Lynwood MARLA Cornea

## 2024-10-05 NOTE — Brief Op Note (Addendum)
 10/05/2024  11:33 AM  PATIENT:  [name]  68 y.o. female  PRE-OPERATIVE DIAGNOSIS:  LEFT LOWER LOBE ADENOCARCINOMA  POST-OPERATIVE DIAGNOSIS:  LEFT LOWER LOBE ADENOCARCINOMA  PROCEDURE:  Procedures with comments:  ROBOT-ASSISTED (Left) - ROBOTIC LEFT LOWER LOBE SUPERIOR SEGMENTECTOMY BLOCK, NERVE, INTERCOSTAL LYMPH NODE BIOPSY LYSIS OF ADHESIONS  SURGEON:  Surgeons and Role:    * Kerrin Elspeth BROCKS, MD - Primary  PHYSICIAN ASSISTANT: Rocky Shad PA-C  ASSISTANTS: none   ANESTHESIA:   general  EBL:  25 mL   BLOOD ADMINISTERED:none  DRAINS: 28 [address]   LOCAL MEDICATIONS USED:  BUPIVICAINE   SPECIMEN:  Source of Specimen:  Superior Segment Left Lower Lobe, Lymph Nodes  DISPOSITION OF SPECIMEN:  PATHOLOGY  COUNTS:  YES  TOURNIQUET:  * No tourniquets in log *  DICTATION: .Dragon Dictation  PLAN OF CARE: Admit to inpatient   PATIENT DISPOSITION:  PACU - hemodynamically stable.   Delay start of Pharmacological VTE agent (>24hrs) due to surgical blood loss or risk of bleeding: no

## 2024-10-05 NOTE — Progress Notes (Addendum)
 Patient continuing to report right eye pain. Assessed in 2C, reports pain somewhat improved with ketorolac drops, vision still slightly blurred in right eye as compared with left. Per family members, the right eye initially had appeared swollen but now appears improved. Reassured patient that this is a fairly normal course for a corneal abrasion which does occur sometimes perioperatively or during emergence from anesthesia. She is staying in house overnight; my partner Dr. Dorethea will follow up on her tomorrow morning (12/13) and assess the need for further intervention/ophthalmologist consultation based on clinical course. Please call the anesthesiologist on call if she has any worsening of her vision or pain.   DOROTHA Rockey Cornea, MD

## 2024-10-05 NOTE — TOC CM/SW Note (Signed)
 Transition of Care [facility]) - Inpatient Brief Assessment   Patient Details  Name: [name] MRN: [id number] Date of Birth: December 28, 1955  Transition of Care [facility]) CM/SW Contact:    Lauraine FORBES Saa, LCSWA Phone Number: 10/05/2024, 4:11 PM   Clinical Narrative:  4:11 PM Per chart review, patient resides at home with spouse. Patient has a PCP and insurance. Patient does not have SNF/HH/DME history. Patient's preferred pharmacy is [facility] [phone] [place]. No TOC needs identified at this time. TOC will continue to follow.  Transition of Care Asessment: Insurance and Status: Insurance coverage has been reviewed Patient has primary care physician: Yes Home environment has been reviewed: Private Residence Prior level of function:: N/A Prior/Current Home Services: No current home services Social Drivers of Health Review: SDOH reviewed no interventions necessary Readmission risk has been reviewed: Yes (Currently Green 7%) Transition of care needs: no transition of care needs at this time

## 2024-10-05 NOTE — Discharge Summary (Signed)
 [address] [place] [address]             [phone]        Physician Discharge Summary  Patient ID: [name] MRN: [id number] DOB/AGE: 70-Sep-1957 70 y.o.  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   Pulmonary nodules/lesions, multiple 04/21/2016   GERD (gastroesophageal reflux disease) 02/28/2016   Essential hypertension 11/27/2008   Renal cancer (HCC) 2007    Discharge Diagnoses:   Patient Active Problem List   Diagnosis Date Noted   Status post robot-assisted Left Video Thoracoscopy with Superior Segmentectomy of Left Lower Lobe 10/05/2024   S/P partial lobectomy of lung 10/05/2024   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) 09/12/2024   Primary adenocarcinoma of upper lobe of right lung (HCC) 09/12/2024   Severe aortic stenosis    Pulmonary nodules    S/P TAVR (transcatheter aortic valve replacement)    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  She has smoked on and off since the 1970s.  Never more than about a half a pack a day.  Quit smoking about 20 years ago.   Her mother and sister both died of aggressive cancers.  She was concerned about the  possibility of lung cancer and her primary care ordered a CT of the chest.  She was noted to have numerous pulmonary nodules.  Those were followed.  On a recent scan left lower lobe nodule increased in size and solid component and there was a new suspicious right upper lobe nodule.  PET/CT showed the left upper lobe nodule was hypermetabolic.  No mediastinal or hilar adenopathy.  Most of the pulmonary nodules were too small to characterize.   Dr. Shelah performed robotic bronchoscopy.  Samples from the superior segment of the left lower lobe and the inferior right upper lobe were positive for adenocarcinoma.     She is retired.  Remains active.  Denies any chest pain, pressure, tightness, shortness of breath, change in appetite, weight loss, headaches, visual changes, or new bone or joint pain.  Dr. Kerrin reviewed the patient's diagnostic studies and determined she would benefit from surgical intervention. He reviewed the patient's treatment options as well as the risks and benefits of surgery with the patient. Ms. Leth was agreeable to proceed with surgery.   Hospital Course:   Ms. Knupp presented to [facility] and was brought to the operating room on 10/05/24. She underwent Robotic Assisted Left Video Thoracoscopy with Superior Segmentectomy of left lower lobe, lymph node dissection, intercostal nerve block, and lysis of adhesions. She tolerated the procedure well and was transferred to the PACU in stable condition. She had a small left apical pneumothorax on CXR which improved with  time. She had tidaling but no air leak present on POD1 with chest tube to water seal. Chest tube was removed without complication on POD1. Follow up CXR showed a stable small left apical pneumothorax. Oxygen was weaned to room air on POD1. She was ambulating well on RA and tolerating a diet. Her incisions were healing well without sign of infection. She was felt stable for discharge home.   Consults:  None  Significant Diagnostic Studies: nuclear medicine:   PET imaging was acquired from the base of the skull to the mid thighs. Non-contrast enhanced computed tomography was obtained for attenuation correction and anatomic localization.   COMPARISON: CT 06/22/2024   CLINICAL HISTORY: Non-small cell lung cancer (NSCLC), metastatic, assess treatment response.   FINDINGS:   HEAD AND NECK: No metabolically active cervical lymphadenopathy.   CHEST: Ill defined nodule in the superior aspect of the left lower lobe measures 13 mm on image 71. This nodule has moderate hypermetabolic activity. There is a biopsy clip adjacent to the nodule. SUV max equal 3.2. Hypermetabolic nodule in the superior aspect of the left lobe is concerning for bronchial carcinoma.   Biopsy clip in the right upper lobe on image 81 is adjacent to a subsolid nodule without metabolic activity. No metabolic activity associated with subsolid nodule in the right upper lobe. Indeterminate finding.   No hypermetabolic activity mediastinal lymph nodes. No evidence of metastatic mediastinal adenopathy.   ABDOMEN AND PELVIS: CT findings surgical anastomosis of the sigmoid colon. Hernia mesh along the ventral abdominal wall. No metabolically active lymphadenopathy. Physiologic activity within the gastrointestinal and genitourinary systems.   BONES AND SOFT TISSUE: Multiple hemangiomas within the THORACIC SPINE. No abnormal FDG activity localizes to the bones. No distant metastatic disease.   IMPRESSION: 1. Ill-defined 13 mm nodule in the superior aspect of the left lower lobe with moderate hypermetabolic activity (SUV max 3.2), concerning for bronchogenic carcinoma. 2. Subsolid right upper lobe nodule without metabolic activity. Indeterminate finding. 3. No hypermetabolic mediastinal adenopathy. 4. No distant hypermetabolic metastatic disease.   Electronically signed by: Norleen Boxer MD 09/05/2024 11:46 AM EST  RP Workstation: [id number]   Treatments: surgery: Operative Report    DATE OF PROCEDURE: 10/05/2024   PREOPERATIVE DIAGNOSIS:  Adenocarcinoma clinical stage IA in the superior segment of the left lower lobe.   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   PROCEDURE:  Xi robotic assisted left lower lobe superior segmentectomy, lymph node sampling, and intercostal nerve blocks levels 3-10.   SURGEON:  Elspeth BROCKS. Kerrin, MD    PATHOLOGY: Adenocarcinoma, full pathology pending  Discharge Exam: Blood pressure 114/66, pulse 85, temperature 98.3 F (36.8 C), temperature source Oral, resp. rate 18, height 5' 6 (1.676 m), weight 79.8 kg, SpO2 91%. General appearance: alert, cooperative, and no distress Neurologic: intact Heart: regular rate and rhythm, S1, S2 normal, no murmur, click, rub or gallop Lungs: clear to auscultation bilaterally Abdomen: soft, non-tender; bowel sounds normal; no masses,  no organomegaly Extremities: extremities normal, atraumatic, no cyanosis or edema Wound: Clean and dry without sign of infection  Disposition: Discharge disposition: 01-Home or Self Care        Allergies as of 10/07/2024       Reactions   Diphenhydramine Hcl    loopy   Protonix  [pantoprazole ] Nausea And Vomiting   Nausea, headache, heightened senses.    Ciprofloxacin Itching   Codeine Itching   Diphenhydramine Other (See Comments)   Morphine  And Codeine Nausea Only, Anxiety   Anxiety and Nausea        Medication List     TAKE these medications    aspirin  EC 81 MG tablet Take 1 tablet (81 mg total) by mouth daily. Swallow whole.   atorvastatin  40 MG tablet Commonly known as: LIPITOR TAKE 1 TABLET BY MOUTH DAILY   cetirizine 10 MG tablet Commonly known as: ZYRTEC Take 10 mg by mouth daily.   fluticasone  50 MCG/ACT nasal spray Commonly known as: FLONASE  Place 1 spray into both nostrils as needed  for allergies or rhinitis.   gabapentin  300 MG capsule Commonly known as: Neurontin  Take 1 capsule (300 mg total) by mouth at bedtime.   ibuprofen 200 MG tablet Commonly known as: ADVIL Take 200 mg by mouth every 4 (four) hours as needed for moderate pain (pain score 4-6).   ketorolac  0.5 % ophthalmic solution Commonly known as: ACULAR  Place 1 drop into the right eye every 6 (six) hours as needed (right eye pain). Stop after 5 days   magnesium  oxide 400 MG tablet Commonly known as: MAG-OX Take 1 tablet (400 mg total) by mouth daily.   metoprolol  succinate 50 MG 24 hr tablet Commonly known as: TOPROL -XL TAKE 1 TABLET BY MOUTH DAILY WITH OR IMMEDIATELY FOLLOWING A MEAL   omeprazole  40 MG capsule Commonly known as: PRILOSEC TAKE 1 CAPSULE BY MOUTH DAILY   oxyCODONE  5 MG immediate release tablet Commonly known as: Oxy IR/ROXICODONE  Take 1 tablet (5 mg total) by mouth every 6 (six) hours as needed for severe pain (pain score 7-10).        Follow-up Information     Kerrin Elspeth BROCKS, MD Follow up.   Specialty: Cardiothoracic Surgery Why: The office will reach out to you regarding appointment time and date, this will also be placed in your mychart. Please get a CXR 1 hour prior to your appointment on the 2nd floor of our building. Contact information: [address] [place] [address] [phone]                 Signed: Con GORMAN Bend, PA-C  10/07/2024, 1:27 PM

## 2024-10-05 NOTE — Interval H&P Note (Signed)
 History and Physical Interval Note:  10/05/2024 7:16 AM  [name]  has presented today for surgery, with the diagnosis of LLL ADENOCARCINOMA.  The various methods of treatment have been discussed with the patient and family. After consideration of risks, benefits and other options for treatment, the patient has consented to  Procedures with comments: RESECTION, LUNG, SEGMENTAL, ROBOT-ASSISTED (Left) - ROBOTIC LEFT LOWER LOBE SUPERIOR SEGMENTECTOMY as a surgical intervention.  The patient's history has been reviewed, patient examined, no change in status, stable for surgery.  I have reviewed the patient's chart and labs.  Questions were answered to the patient's satisfaction.     [name]

## 2024-10-06 ENCOUNTER — Inpatient Hospital Stay (HOSPITAL_COMMUNITY)

## 2024-10-06 LAB — CBC
HCT: 32 % — ABNORMAL LOW (ref 36.0–46.0)
Hemoglobin: 10.5 g/dL — ABNORMAL LOW (ref 12.0–15.0)
MCH: 28.2 pg (ref 26.0–34.0)
MCHC: 32.8 g/dL (ref 30.0–36.0)
MCV: 85.8 fL (ref 80.0–100.0)
Platelets: 203 K/uL (ref 150–400)
RBC: 3.73 MIL/uL — ABNORMAL LOW (ref 3.87–5.11)
RDW: 13.4 % (ref 11.5–15.5)
WBC: 15.1 K/uL — ABNORMAL HIGH (ref 4.0–10.5)
nRBC: 0.3 % — ABNORMAL HIGH (ref 0.0–0.2)

## 2024-10-06 LAB — BASIC METABOLIC PANEL WITH GFR
Anion gap: 12 (ref 5–15)
BUN: 18 mg/dL (ref 8–23)
CO2: 20 mmol/L — ABNORMAL LOW (ref 22–32)
Calcium: 8.7 mg/dL — ABNORMAL LOW (ref 8.9–10.3)
Chloride: 101 mmol/L (ref 98–111)
Creatinine, Ser: 0.68 mg/dL (ref 0.44–1.00)
GFR, Estimated: >60 mL/min (ref >60)
Glucose, Bld: 113 mg/dL — ABNORMAL HIGH (ref 70–99)
Potassium: 3.9 mmol/L (ref 3.5–5.1)
Sodium: 133 mmol/L — ABNORMAL LOW (ref 135–145)

## 2024-10-06 NOTE — Plan of Care (Signed)

## 2024-10-06 NOTE — Progress Notes (Signed)
 Follow-up for corneal abrasion with visual defects 10/05/24 post-op. Patient reports significantly improved pain control without visual field deficits to affected eye. Pupils equal, reactive, no scleral injection. Will continue toradol /saline flushes PRN.

## 2024-10-06 NOTE — Progress Notes (Signed)
 Mobility Specialist Progress Note;   10/06/24 0912  Mobility  Activity Ambulated with assistance  Level of Assistance Standby assist, set-up cues, supervision of patient - no hands on  Assistive Device Front wheel walker  Distance Ambulated (ft) 400 ft  Activity Response Tolerated well  Mobility Referral Yes  Mobility visit 1 Mobility  Mobility Specialist Start Time (ACUTE ONLY) 0912  Mobility Specialist Stop Time (ACUTE ONLY) 0930  Mobility Specialist Time Calculation (min) (ACUTE ONLY) 18 min   Pt eager for mobility. On 2LO2 upon arrival. Required no physical assistance during ambulation, SV for safety. Able to ambulate on 1LO2, SPO2 95%>. No c/o when asked. Pt able to void in BR w/o fault. Returned pt to bed and left with all needs met. Returned to suction.   Lauraine Erm Mobility Specialist Please contact via SecureChat or [employer] [phone]

## 2024-10-06 NOTE — Progress Notes (Signed)
 Mobility Specialist Progress Note;   10/06/24 1450  Mobility  Activity Ambulated independently  Level of Assistance Standby assist, set-up cues, supervision of patient - no hands on  Assistive Device None  Distance Ambulated (ft) 400 ft  Activity Response Tolerated well  Mobility Referral Yes  Mobility visit 1 Mobility  Mobility Specialist Start Time (ACUTE ONLY) 1450  Mobility Specialist Stop Time (ACUTE ONLY) 1500  Mobility Specialist Time Calculation (min) (ACUTE ONLY) 10 min   Pt eager for mobility and requesting second session. CT now removed and weaned off O2. Able to ambulate w/ SV for safety. VSS throughout and no c/o when asked. Pt returned back to bed and left with all needs met.   Lauraine Erm Mobility Specialist Please contact via SecureChat or [employer] [phone]

## 2024-10-06 NOTE — Progress Notes (Addendum)
° °   °  [address] [place] [address]             [phone]      1 Day Post-Op Procedures (LRB): LUNG RESECTION, LEFT LOWER LOBE, SEGMENTAL, ROBOT-ASSISTED (Left) BLOCK, NERVE, INTERCOSTAL LYMPH NODE BIOPSY Subjective: The patient reports her eye feels much better and she has some chest soreness but otherwise no complaints.   Objective: Vital signs in last 24 hours: Temp:  [97.6 F (36.4 C)-97.9 F (36.6 C)] 97.9 F (36.6 C) (12/13 0728) Pulse Rate:  [73-86] 73 (12/13 0728) Cardiac Rhythm: Normal sinus rhythm (12/13 0700) Resp:  [10-25] 17 (12/13 0728) BP: (90-110)/(46-67) 94/54 (12/13 0728) SpO2:  [90 %-100 %] 97 % (12/13 0728) Arterial Line BP: (93-119)/(49-56) 106/56 (12/12 1315)  Hemodynamic parameters for last 24 hours:    Intake/Output from previous day: 12/12 0701 - 12/13 0700 In: 2462.4 [P.O.:360; I.V.:1502.4; IV Piggyback:600] Out: 1105 [Urine:830; Blood:25; Chest Tube:250] Intake/Output this shift: Total I/O In: -  Out: 45 [Urine:45]  General appearance: alert, cooperative, and no distress Neurologic: intact Heart: regular rate and rhythm, S1, S2 normal, no murmur, click, rub or gallop Lungs: clear to auscultation bilaterally Abdomen: soft, non-tender; bowel sounds normal; no masses,  no organomegaly Extremities: SCDs in place Wound: Clean and dry dressing over chest tube site, other incisions are clean and dry without sign of infection  Lab Results: Recent Labs    10/03/24 1354 10/06/24 0426  WBC 8.1 15.1*  HGB 12.9 10.5*  HCT 40.3 32.0*  PLT 269 203   BMET:  Recent Labs    10/03/24 1354 10/06/24 0426  NA 140 133*  K 3.9 3.9  CL 100 101  CO2 28 20*  GLUCOSE 105* 113*  BUN 12 18  CREATININE 0.62 0.68  CALCIUM  10.0 8.7*    PT/INR:  Recent Labs    10/03/24 1354  LABPROT 13.6  INR 1.0   ABG    Component Value Date/Time   PHART 7.419 11/09/2019 0930   HCO3 27.9 11/09/2019 0930   TCO2 28 11/13/2019 1535    O2SAT 97.3 11/09/2019 0930   CBG (last 3)  No results for input(s): GLUCAP in the last 72 hours.  Assessment/Plan: S/P Procedures (LRB): LUNG RESECTION, LEFT LOWER LOBE, SEGMENTAL, ROBOT-ASSISTED (Left) BLOCK, NERVE, INTERCOSTAL LYMPH NODE BIOPSY  Neuro: Pain overall controlled, continue current pain regimen.  HENT: R Corneal abrasion. Much improved. Continue Acular  drops, anesthesia following.   CV: Soft BP, SBP 90s-106. NSR, HR 80s.   Pulm: Saturating well on 2L [place]. CT to water seal. CT with tidaling but no clear air leak this AM. CT output 250cc/24hrs. CXR with improved left apical pneumothorax. Encourage IS and ambulation. Wean oxygen as able for O2>90%.   GI: Tolerating a diet, will d/c IV fluids  Renal: Cr 0.68, stable. UO 830cc/24hrs. D/c foley  ID: Likely reactive leukocytosis, WBC 15.1. Afebrile.   Expected postop ABLA: H/H 10.5/32, not clinically significant at this time.   DVT Prophylaxis: Lovenox   Dispo: No clear air leak seen this AM but a lot of tidaling, may leave CT in place another day vs clamping trial, will discuss with surgeon.    LOS: 1 day    Con GORMAN Bend, PA-C 10/06/2024  Patient seen and examined, agree with above Looks great No air leak- dc chest tube Ambulate  Elspeth C. Kerrin, MD [facility] [phone]

## 2024-10-07 ENCOUNTER — Inpatient Hospital Stay (HOSPITAL_COMMUNITY)

## 2024-10-07 DIAGNOSIS — Z9889 Other specified postprocedural states: Secondary | ICD-10-CM | POA: Diagnosis not present

## 2024-10-07 DIAGNOSIS — J90 Pleural effusion, not elsewhere classified: Secondary | ICD-10-CM | POA: Diagnosis not present

## 2024-10-07 DIAGNOSIS — J9811 Atelectasis: Secondary | ICD-10-CM | POA: Diagnosis not present

## 2024-10-07 DIAGNOSIS — I517 Cardiomegaly: Secondary | ICD-10-CM | POA: Diagnosis not present

## 2024-10-07 DIAGNOSIS — J939 Pneumothorax, unspecified: Secondary | ICD-10-CM | POA: Diagnosis not present

## 2024-10-07 LAB — CBC
HCT: 31.4 % — ABNORMAL LOW (ref 36.0–46.0)
Hemoglobin: 10.1 g/dL — ABNORMAL LOW (ref 12.0–15.0)
MCH: 27.9 pg (ref 26.0–34.0)
MCHC: 32.2 g/dL (ref 30.0–36.0)
MCV: 86.7 fL (ref 80.0–100.0)
Platelets: 202 K/uL (ref 150–400)
RBC: 3.62 MIL/uL — ABNORMAL LOW (ref 3.87–5.11)
RDW: 13.5 % (ref 11.5–15.5)
WBC: 11.2 K/uL — ABNORMAL HIGH (ref 4.0–10.5)
nRBC: 0 % (ref 0.0–0.2)

## 2024-10-07 LAB — COMPREHENSIVE METABOLIC PANEL WITH GFR
ALT: 12 U/L (ref 0–44)
AST: 16 U/L (ref 15–41)
Albumin: 3.3 g/dL — ABNORMAL LOW (ref 3.5–5.0)
Alkaline Phosphatase: 66 U/L (ref 38–126)
Anion gap: 11 (ref 5–15)
BUN: 19 mg/dL (ref 8–23)
CO2: 25 mmol/L (ref 22–32)
Calcium: 8.4 mg/dL — ABNORMAL LOW (ref 8.9–10.3)
Chloride: 98 mmol/L (ref 98–111)
Creatinine, Ser: 0.69 mg/dL (ref 0.44–1.00)
GFR, Estimated: >60 mL/min (ref >60)
Glucose, Bld: 98 mg/dL (ref 70–99)
Potassium: 3.5 mmol/L (ref 3.5–5.1)
Sodium: 134 mmol/L — ABNORMAL LOW (ref 135–145)
Total Bilirubin: 0.6 mg/dL (ref 0.0–1.2)
Total Protein: 6.2 g/dL — ABNORMAL LOW (ref 6.5–8.1)

## 2024-10-07 MED ORDER — GABAPENTIN 300 MG PO CAPS: 300.0000 mg | ORAL_CAPSULE | Freq: Every day | ORAL | 1 refills | Status: DC

## 2024-10-07 MED ORDER — OXYCODONE HCL 5 MG PO TABS: 5.0000 mg | ORAL_TABLET | Freq: Four times a day (QID) | ORAL | 0 refills | Status: DC | PRN

## 2024-10-07 MED ORDER — METOPROLOL SUCCINATE ER 50 MG PO TB24: 50.0000 mg | ORAL_TABLET | Freq: Every day | ORAL | Status: DC

## 2024-10-07 MED ORDER — KETOROLAC TROMETHAMINE 0.5 % OP SOLN: 1.0000 [drp] | Freq: Four times a day (QID) | OPHTHALMIC | 0 refills | Status: DC | PRN

## 2024-10-07 NOTE — Progress Notes (Signed)
° °   °  [address] [place] [address]             [phone]      2 Days Post-Op Procedures (LRB): LUNG RESECTION, LEFT LOWER LOBE, SEGMENTAL, ROBOT-ASSISTED (Left) BLOCK, NERVE, INTERCOSTAL LYMPH NODE BIOPSY Subjective: Patient reports I'm ready to break out of here. No complaints this AM, reports she feels good and is ready to go home.   Objective: Vital signs in last 24 hours: Temp:  [98.2 F (36.8 C)-99 F (37.2 C)] 98.3 F (36.8 C) (12/14 0735) Pulse Rate:  [81-85] 85 (12/14 0436) Cardiac Rhythm: Normal sinus rhythm (12/14 0700) Resp:  [16-20] 18 (12/14 0735) BP: (89-116)/(55-79) 114/66 (12/14 0735) SpO2:  [91 %-93 %] 91 % (12/14 0436)  Hemodynamic parameters for last 24 hours:    Intake/Output from previous day: 12/13 0701 - 12/14 0700 In: -  Out: 95 [Urine:45; Chest Tube:50] Intake/Output this shift: Total I/O In: 240 [P.O.:240] Out: -   General appearance: alert, cooperative, and no distress Neurologic: intact Heart: regular rate and rhythm, S1, S2 normal, no murmur, click, rub or gallop Lungs: clear to auscultation bilaterally Abdomen: soft, non-tender; bowel sounds normal; no masses,  no organomegaly Extremities: extremities normal, atraumatic, no cyanosis or edema Wound: Clean and dry without sign of infection  Lab Results: Recent Labs    10/06/24 0426 10/07/24 0312  WBC 15.1* 11.2*  HGB 10.5* 10.1*  HCT 32.0* 31.4*  PLT 203 202   BMET:  Recent Labs    10/06/24 0426 10/07/24 0312  NA 133* 134*  K 3.9 3.5  CL 101 98  CO2 20* 25  GLUCOSE 113* 98  BUN 18 19  CREATININE 0.68 0.69  CALCIUM  8.7* 8.4*    PT/INR: No results for input(s): LABPROT, INR in the last 72 hours. ABG    Component Value Date/Time   PHART 7.419 11/09/2019 0930   HCO3 27.9 11/09/2019 0930   TCO2 28 11/13/2019 1535   O2SAT 97.3 11/09/2019 0930   CBG (last 3)  No results for input(s): GLUCAP in the last 72  hours.  Assessment/Plan: S/P Procedures (LRB): LUNG RESECTION, LEFT LOWER LOBE, SEGMENTAL, ROBOT-ASSISTED (Left) BLOCK, NERVE, INTERCOSTAL LYMPH NODE BIOPSY  Neuro: Pain overall controlled, continue current pain regimen.   HENT: R Corneal abrasion. Much improved with Acular  drops. No visual deficits.    CV: Soft BP, SBP 90s-110s. NSR, HR 80s. Hx of TAVR, received her Toprol  XL 50mg  yesterday, will continue.    Pulm: Saturating well on RA. CT removed yesterday. CXR with small stable left apical space. Encourage IS and ambulation.   GI: Tolerating a diet.   Renal: Cr 0.69, stable.   ID: Likely reactive leukocytosis improving, WBC 11.2. Afebrile.    Expected postop ABLA: H/H 10.1/31.4, not clinically significant at this time.    DVT Prophylaxis: Lovenox    Dispo: Plan to d/c home today     LOS: 2 days    Con GORMAN Bend, PA-C 10/07/2024

## 2024-10-08 ENCOUNTER — Encounter (HOSPITAL_COMMUNITY): Payer: Self-pay | Admitting: Thoracic Surgery (Cardiothoracic Vascular Surgery)

## 2024-10-08 LAB — SURGICAL PATHOLOGY

## 2024-10-11 ENCOUNTER — Other Ambulatory Visit: Payer: Self-pay | Admitting: Cardiovascular Disease

## 2024-10-12 ENCOUNTER — Other Ambulatory Visit: Payer: Self-pay | Admitting: Thoracic Surgery (Cardiothoracic Vascular Surgery)

## 2024-10-12 DIAGNOSIS — C3432 Malignant neoplasm of lower lobe, left bronchus or lung: Secondary | ICD-10-CM

## 2024-10-12 NOTE — Telephone Encounter (Signed)
 Pt of Dr. Delford. Does Dr. Delford want to refill this RX? Please advise

## 2024-10-23 ENCOUNTER — Ambulatory Visit
Payer: Self-pay | Attending: Thoracic Surgery (Cardiothoracic Vascular Surgery) | Admitting: Thoracic Surgery (Cardiothoracic Vascular Surgery)

## 2024-10-23 ENCOUNTER — Ambulatory Visit (HOSPITAL_COMMUNITY)
Admission: RE | Admit: 2024-10-23 | Discharge: 2024-10-23 | Disposition: A | Discharge status: Home / Self Care | Source: Ambulatory Visit | Attending: Cardiovascular Disease | Admitting: Cardiovascular Disease

## 2024-10-23 ENCOUNTER — Other Ambulatory Visit: Payer: Self-pay | Admitting: Thoracic Surgery (Cardiothoracic Vascular Surgery)

## 2024-10-23 VITALS — BP 119/69 | HR 94 | Resp 20 | Ht 66.0 in | Wt 173.7 lb

## 2024-10-23 DIAGNOSIS — Z09 Encounter for follow-up examination after completed treatment for conditions other than malignant neoplasm: Secondary | ICD-10-CM

## 2024-10-23 DIAGNOSIS — C3432 Malignant neoplasm of lower lobe, left bronchus or lung: Secondary | ICD-10-CM

## 2024-10-23 MED ORDER — GABAPENTIN 300 MG PO CAPS: 300.0000 mg | ORAL_CAPSULE | Freq: Two times a day (BID) | ORAL | 1 refills | Status: AC

## 2024-10-23 MED ORDER — METHOCARBAMOL 500 MG PO TABS: 500.0000 mg | ORAL_TABLET | Freq: Three times a day (TID) | ORAL | 2 refills | Status: AC | PRN

## 2024-10-23 MED ORDER — OXYCODONE HCL 5 MG PO TABS: 5.0000 mg | ORAL_TABLET | Freq: Four times a day (QID) | ORAL | 0 refills | Status: AC | PRN

## 2024-10-23 NOTE — Progress Notes (Signed)
 "     [address]       [name] [address]             [phone]       HPI: [name] returns for a scheduled follow-up visit after recent left lower lobe superior segmentectomy.  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  She had a CT of the chest because of her smoking history.  She was noted to have numerous pulmonary nodules which were followed.  Recent scan showed an increase in size and solid component of a left lower lobe nodule.  There also was a new suspicious right upper lobe nodule.  On PET the left lower lobe nodule was hypermetabolic, the right upper lobe nodule was too small to characterize.  Robotic bronchoscopy was performed by Dr. Shelah.  Both the left lower lobe and right upper lobe nodules were positive for adenocarcinoma.  I did a robotic assisted left lower lobe superior segmentectomy on 10/05/2024.  She went home on postoperative day #2.  She complains of pain.  Mostly between the more posterior to incisions.  She feels like there is a lump there.  Not much in the way of paresthesias.  Has been taking 2 Advil (400 mg) 4 times a day, gabapentin  300 mg daily, and oxycodone  5 mg about twice daily.  Past Medical History:  Diagnosis Date   Diverticulitis, colon    GERD (gastroesophageal reflux disease)    H/O colonoscopy 07/19/2022   Heart murmur    s/p TAVR 2021   Hypertension    Mild dilation of ascending aorta    Pulmonary nodules    seen on pre TAVR CT, needs follow up    Renal cancer [facility]) 2007   Surgically removed from right kidney   S/P TAVR (transcatheter aortic valve replacement)    s/p TAVR w/ a Edwards Sapien 3 Ultra THV via the TF approach on 11/13/19 with Dr. Wonda and Lucas.    Severe aortic  stenosis    Stroke (cerebrum) (HCC)    a. MRI of brain 12/2021 RI showed punctate subacute versus chronic white matter lacunar infarct in the posterior left corona radiata/centrum semiovale.    Current Outpatient Medications  Medication Sig Dispense Refill   aspirin  EC 81 MG tablet Take 1 tablet (81 mg total) by mouth daily. Swallow whole. 90 tablet 3   atorvastatin  (LIPITOR) 40 MG tablet TAKE 1 TABLET BY MOUTH DAILY 90 tablet 2   cetirizine (ZYRTEC) 10 MG tablet Take 10 mg by mouth daily.     fluticasone  (FLONASE ) 50 MCG/ACT nasal spray Place 1 spray into both nostrils as needed for allergies or rhinitis.     ibuprofen (ADVIL) 200 MG tablet Take 200 mg by mouth every 4 (four) hours as needed for moderate pain (pain score 4-6).     ketorolac  (ACULAR ) 0.5 % ophthalmic solution Place 1 drop into the right eye every 6 (six) hours as needed (right eye pain). Stop after 5 days 3 mL 0   magnesium  oxide (MAG-OX) 400 MG tablet Take 1 tablet (400 mg total) by mouth daily. 30 tablet 11   methocarbamol (ROBAXIN) 500 MG tablet Take 1 tablet (500 mg total) by mouth every 8 (eight) hours as needed for muscle spasms. 30 tablet 2   metoprolol   succinate (TOPROL -XL) 50 MG 24 hr tablet TAKE 1 TABLET BY MOUTH DAILY WITH OR IMMEDIATELY FOLLOWING A MEAL 90 tablet 2   omeprazole  (PRILOSEC) 40 MG capsule TAKE 1 CAPSULE BY MOUTH DAILY 30 capsule 1   gabapentin  (NEURONTIN ) 300 MG capsule Take 1 capsule (300 mg total) by mouth 2 (two) times daily. 60 capsule 1   oxyCODONE  (OXY IR/ROXICODONE ) 5 MG immediate release tablet Take 1 tablet (5 mg total) by mouth every 6 (six) hours as needed for severe pain (pain score 7-10). 28 tablet 0   No current facility-administered medications for this visit.    Physical Exam BP 119/69 (BP Location: Left Arm, Patient Position: Sitting, Cuff Size: Normal)   Pulse 94   Resp 20   Ht 5' 6 (1.676 m)   Wt 173 lb 11.2 oz (78.8 kg)   SpO2 94% Comment: RA  BMI 28.13 kg/m  68 year old  woman in no acute distress, but obvious discomfort.   Alert and oriented x 3 with no focal deficits Lungs clear with equal breath sounds bilaterally Incisions well-healed Cardiac regular rate and rhythm No peripheral edema  Diagnostic Tests: I personally reviewed her chest x-ray images.  Postoperative changes.  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  Stage Ia adenocarcinoma left lower lobe-status post left lower lobe superior segmentectomy.  Stage Ia adenocarcinoma right upper lobe-yet to be treated.  Plan would be to do a right upper lobe wedge resection or segmentectomy after she recovers from her recent segmentectomy.  Alternative would be stereotactic radiation if she decides not to have a second operation.  Will plan to see her back in about a month.  It will have been 3 months since her previous scan so we will do a CT chest to follow-up the nodule at that time.  Status post left lower lobe superior segmentectomy-looks great.  She is having some pain issues.  Sounds like there may be a significant muscle spasm component involved.  Will start her on Robaxin 500 mg 3 times daily as needed.  I will also increase her gabapentin  to 300 mg twice daily.  In addition rather than taking 2 Advil 4 times a day, I recommended that she take 1 Tylenol  and 1 Advil 4 times a day.  Will continue to use oxycodone  as needed.  She is running low on those so I gave her another prescription for oxycodone  5 mg, 28 tablets, no refills.  Should not drive until her pain improves.  Can gradually resume activities as tolerated.  Plan: Increase gabapentin  to 300 mg twice daily Continue as needed oxycodone  Take 1 Tylenol  and 1 Advil together 4 times daily Robaxin 500  mg p.o. 3 times daily as needed Return in 1 month with CT chest to follow-up right upper lobe nodule.  Tiffany JAYSON Millers, MD [facility] [phone]     "

## 2024-10-24 ENCOUNTER — Inpatient Hospital Stay (HOSPITAL_BASED_OUTPATIENT_CLINIC_OR_DEPARTMENT_OTHER): Admitting: Internal Medicine

## 2024-10-24 ENCOUNTER — Inpatient Hospital Stay: Attending: Internal Medicine

## 2024-10-24 VITALS — BP 115/67 | HR 91 | Temp 97.7°F | Resp 17 | Ht 66.0 in | Wt 172.0 lb

## 2024-10-24 DIAGNOSIS — C3432 Malignant neoplasm of lower lobe, left bronchus or lung: Secondary | ICD-10-CM | POA: Insufficient documentation

## 2024-10-24 DIAGNOSIS — Z801 Family history of malignant neoplasm of trachea, bronchus and lung: Secondary | ICD-10-CM | POA: Diagnosis not present

## 2024-10-24 DIAGNOSIS — C3411 Malignant neoplasm of upper lobe, right bronchus or lung: Secondary | ICD-10-CM | POA: Insufficient documentation

## 2024-10-24 LAB — CMP (CANCER CENTER ONLY)
ALT: 12 U/L (ref 0–44)
AST: 21 U/L (ref 15–41)
Albumin: 4 g/dL (ref 3.5–5.0)
Alkaline Phosphatase: 113 U/L (ref 38–126)
Anion gap: 9 (ref 5–15)
BUN: 14 mg/dL (ref 8–23)
CO2: 29 mmol/L (ref 22–32)
Calcium: 9.4 mg/dL (ref 8.9–10.3)
Chloride: 101 mmol/L (ref 98–111)
Creatinine: 0.82 mg/dL (ref 0.44–1.00)
GFR, Estimated: >60 mL/min
Glucose, Bld: 111 mg/dL — ABNORMAL HIGH (ref 70–99)
Potassium: 4.1 mmol/L (ref 3.5–5.1)
Sodium: 139 mmol/L (ref 135–145)
Total Bilirubin: 0.3 mg/dL (ref 0.0–1.2)
Total Protein: 7.6 g/dL (ref 6.5–8.1)

## 2024-10-24 LAB — CBC WITH DIFFERENTIAL (CANCER CENTER ONLY)
Abs Immature Granulocytes: 0.03 K/uL (ref 0.00–0.07)
Basophils Absolute: 0.1 K/uL (ref 0.0–0.1)
Basophils Relative: 1 %
Eosinophils Absolute: 0.3 K/uL (ref 0.0–0.5)
Eosinophils Relative: 4 %
HCT: 33.7 % — ABNORMAL LOW (ref 36.0–46.0)
Hemoglobin: 11 g/dL — ABNORMAL LOW (ref 12.0–15.0)
Immature Granulocytes: 0 %
Lymphocytes Relative: 18 %
Lymphs Abs: 1.5 K/uL (ref 0.7–4.0)
MCH: 27.2 pg (ref 26.0–34.0)
MCHC: 32.6 g/dL (ref 30.0–36.0)
MCV: 83.2 fL (ref 80.0–100.0)
Monocytes Absolute: 0.7 K/uL (ref 0.1–1.0)
Monocytes Relative: 8 %
Neutro Abs: 5.5 K/uL (ref 1.7–7.7)
Neutrophils Relative %: 69 %
Platelet Count: 405 K/uL — ABNORMAL HIGH (ref 150–400)
RBC: 4.05 MIL/uL (ref 3.87–5.11)
RDW: 12.8 % (ref 11.5–15.5)
WBC Count: 8.2 K/uL (ref 4.0–10.5)
nRBC: 0 % (ref 0.0–0.2)

## 2024-10-24 NOTE — Progress Notes (Signed)
 "     [facility] [phone]   [phone]  OFFICE PROGRESS NOTE  Katina Pfeiffer, PA-C [address] [place] [address]  DIAGNOSIS: Bilateral stage IA (T1b, N0, M0) non-small cell lung cancer involving the superior segment of the left lower lobe as well as right upper lobe adenocarcinoma diagnosed in October 2025.   PRIOR THERAPY: Status post Xi robotic assisted left lower lobe superior segmentectomy, Lymph node sampling under the care of Dr. Kerrin on October 05, 2024.  The tumor size was 1.7 cm.  CURRENT THERAPY: Observation.  INTERVAL HISTORY: [name] 68 y.o. female returns to the clinic today for follow-up visit accompanied by her husband. Discussed the use of AI scribe software for clinical note transcription with the patient, who gave verbal consent to proceed.  History of Present Illness [name] is a 68 year old female with bilateral stage IA lung adenocarcinoma who presents for post-operative evaluation and management recommendations.  She was diagnosed in October 2025 with bilateral stage IA lung adenocarcinoma, including a 1.7 cm lesion in the superior segment of the left lower lobe and a 1.0 x 0.8 cm nodule in the right upper lobe. She underwent left lower lobe segmentectomy with lymph node sampling on October 05, 2024. She was hospitalized for several days post-operatively and is now two weeks post-surgery.  She continues to experience localized pain on the left side, described as a sensation of fullness at the surgical site. She discussed this with her surgeon, who reassured her that this is expected after surgery. She denies any other pain or new symptoms. She has no respiratory complaints beyond expected post-surgical discomfort.  She is scheduled for a scan of the chest in one week to further evaluate the right upper lobe nodule. She reports that her recent chest x-rays were favorable, as relayed by her  surgeon. She has no additional complaints or concerns.     MEDICAL HISTORY: Past Medical History:  Diagnosis Date   Diverticulitis, colon    GERD (gastroesophageal reflux disease)    H/O colonoscopy 07/19/2022   Heart murmur    s/p TAVR 2021   Hypertension    Mild dilation of ascending aorta    Pulmonary nodules    seen on pre TAVR CT, needs follow up    Renal cancer [facility]) 2007   Surgically removed from right kidney   S/P TAVR (transcatheter aortic valve replacement)    s/p TAVR w/ a Edwards Sapien 3 Ultra THV via the TF approach on 11/13/19 with Dr. Wonda and Lucas.    Severe aortic stenosis    Stroke (cerebrum) (HCC)    a. MRI of brain 12/2021 RI showed punctate subacute versus chronic white matter lacunar infarct in the posterior left corona radiata/centrum semiovale.    ALLERGIES:  is allergic to diphenhydramine hcl, protonix  [pantoprazole ], ciprofloxacin, codeine, diphenhydramine, and morphine and codeine.  MEDICATIONS:  Current Outpatient Medications  Medication Sig Dispense Refill   aspirin  EC 81 MG tablet Take 1 tablet (81 mg total) by mouth daily. Swallow whole. 90 tablet 3   atorvastatin  (LIPITOR) 40 MG tablet TAKE 1 TABLET BY MOUTH DAILY 90 tablet 2   cetirizine (ZYRTEC) 10 MG tablet Take 10 mg by mouth daily.     fluticasone  (FLONASE ) 50 MCG/ACT nasal spray Place 1 spray into both nostrils as needed for allergies or rhinitis.     gabapentin  (NEURONTIN ) 300 MG capsule Take 1 capsule (300 mg total) by mouth 2 (two) times daily.  60 capsule 1   ibuprofen (ADVIL) 200 MG tablet Take 200 mg by mouth every 4 (four) hours as needed for moderate pain (pain score 4-6).     ketorolac  (ACULAR ) 0.5 % ophthalmic solution Place 1 drop into the right eye every 6 (six) hours as needed (right eye pain). Stop after 5 days 3 mL 0   magnesium  oxide (MAG-OX) 400 MG tablet Take 1 tablet (400 mg total) by mouth daily. 30 tablet 11   methocarbamol (ROBAXIN) 500 MG tablet Take 1 tablet (500 mg  total) by mouth every 8 (eight) hours as needed for muscle spasms. 30 tablet 2   metoprolol  succinate (TOPROL -XL) 50 MG 24 hr tablet TAKE 1 TABLET BY MOUTH DAILY WITH OR IMMEDIATELY FOLLOWING A MEAL 90 tablet 2   omeprazole  (PRILOSEC) 40 MG capsule TAKE 1 CAPSULE BY MOUTH DAILY 30 capsule 1   oxyCODONE  (OXY IR/ROXICODONE ) 5 MG immediate release tablet Take 1 tablet (5 mg total) by mouth every 6 (six) hours as needed for severe pain (pain score 7-10). 28 tablet 0   No current facility-administered medications for this visit.    SURGICAL HISTORY:  Past Surgical History:  Procedure Laterality Date   APPENDECTOMY     COLON SURGERY     HERNIA REPAIR     INTERCOSTAL NERVE BLOCK  10/05/2024   Procedure: BLOCK, NERVE, INTERCOSTAL;  Surgeon: Kerrin Elspeth BROCKS, MD;  Location: [facility] OR;  Service: Thoracic;;   LYMPH NODE BIOPSY  10/05/2024   Procedure: LYMPH NODE BIOPSY;  Surgeon: Kerrin Elspeth BROCKS, MD;  Location: [facility] OR;  Service: Thoracic;;   OVARIAN CYST SURGERY     RIGHT/LEFT HEART CATH AND CORONARY ANGIOGRAPHY N/A 08/22/2019   Procedure: RIGHT/LEFT HEART CATH AND CORONARY ANGIOGRAPHY;  Surgeon: Claudene Victory ORN, MD;  Location: [facility] INVASIVE CV LAB;  Service: Cardiovascular;  Laterality: N/A;   TEE WITHOUT CARDIOVERSION N/A 11/13/2019   Procedure: TRANSESOPHAGEAL ECHOCARDIOGRAM (TEE);  Surgeon: Wonda Sharper, MD;  Location: [facility] INVASIVE CV LAB;  Service: Open Heart Surgery;  Laterality: N/A;   TRANSCATHETER AORTIC VALVE REPLACEMENT, TRANSFEMORAL N/A 11/13/2019   Procedure: TRANSCATHETER AORTIC VALVE REPLACEMENT, TRANSFEMORAL;  Surgeon: Wonda Sharper, MD;  Location: [facility] INVASIVE CV LAB;  Service: Open Heart Surgery;  Laterality: N/A;   VIDEO BRONCHOSCOPY WITH ENDOBRONCHIAL NAVIGATION Bilateral 08/21/2024   Procedure: VIDEO BRONCHOSCOPY WITH ENDOBRONCHIAL NAVIGATION;  Surgeon: Shelah Lamar RAMAN, MD;  Location: [facility] ENDOSCOPY;  Service: Pulmonary;  Laterality: Bilateral;   XI ROBOTIC ASSISTED  THORACOSCOPY- SEGMENTECTOMY Left 10/05/2024   Procedure: LUNG RESECTION, LEFT LOWER LOBE, SEGMENTAL, ROBOT-ASSISTED;  Surgeon: Kerrin Elspeth BROCKS, MD;  Location: [facility] OR;  Service: Thoracic;  Laterality: Left;  ROBOTIC LEFT LOWER LOBE SUPERIOR SEGMENTECTOMY    REVIEW OF SYSTEMS:  Constitutional: negative Eyes: negative Ears, nose, mouth, throat, and face: negative Respiratory: positive for pleurisy/chest pain Cardiovascular: negative Gastrointestinal: negative Genitourinary:negative Integument/breast: negative Hematologic/lymphatic: negative Musculoskeletal:negative Neurological: negative Behavioral/Psych: negative Endocrine: negative Allergic/Immunologic: negative   PHYSICAL EXAMINATION: General appearance: alert, cooperative, and no distress Head: Normocephalic, without obvious abnormality, atraumatic Neck: no adenopathy, no JVD, supple, symmetrical, trachea midline, and thyroid  not enlarged, symmetric, no tenderness/mass/nodules Lymph nodes: Cervical, supraclavicular, and axillary nodes normal. Resp: clear to auscultation bilaterally Back: symmetric, no curvature. ROM normal. No CVA tenderness. Cardio: regular rate and rhythm, S1, S2 normal, no murmur, click, rub or gallop GI: soft, non-tender; bowel sounds normal; no masses,  no organomegaly Extremities: extremities normal, atraumatic, no cyanosis or edema Neurologic: Alert and oriented X 3, normal strength and tone.  Normal symmetric reflexes. Normal coordination and gait  ECOG PERFORMANCE STATUS: 1 - Symptomatic but completely ambulatory  Blood pressure 115/67, pulse 91, temperature 97.7 F (36.5 C), temperature source Temporal, resp. rate 17, height 5' 6 (1.676 m), weight 172 lb (78 kg), SpO2 95%.  LABORATORY DATA: Lab Results  Component Value Date   WBC 11.2 (H) 10/07/2024   HGB 10.1 (L) 10/07/2024   HCT 31.4 (L) 10/07/2024   MCV 86.7 10/07/2024   PLT 202 10/07/2024      Chemistry      Component Value Date/Time    NA 134 (L) 10/07/2024 0312   NA 141 04/06/2024 1122   K 3.5 10/07/2024 0312   CL 98 10/07/2024 0312   CO2 25 10/07/2024 0312   BUN 19 10/07/2024 0312   BUN 14 04/06/2024 1122   CREATININE 0.69 10/07/2024 0312   CREATININE 0.64 09/12/2024 1333      Component Value Date/Time   CALCIUM  8.4 (L) 10/07/2024 0312   ALKPHOS 66 10/07/2024 0312   AST 16 10/07/2024 0312   AST 23 09/12/2024 1333   ALT 12 10/07/2024 0312   ALT 16 09/12/2024 1333   BILITOT 0.6 10/07/2024 0312   BILITOT 0.4 09/12/2024 1333       RADIOGRAPHIC STUDIES: DG Chest 2 View Result Date: 10/23/2024 CLINICAL DATA:  Pneumothorax. EXAM: CHEST - 2 VIEW COMPARISON:  Chest radiograph dated 10/07/2024. FINDINGS: Similar appearance of small left lung base atelectasis/scarring. Trace left pleural effusion suspected. No detectable pneumothorax. Stable cardiac silhouette. No acute osseous pathology. IMPRESSION: 1. No detectable pneumothorax. 2. Small left lung base atelectasis/scarring. Electronically Signed   By: Vanetta Chou M.D.   On: 10/23/2024 16:55   DG Chest 2 View Result Date: 10/07/2024 EXAM: 2 VIEW(S) XRAY OF THE CHEST 10/07/2024 04:50:00 AM COMPARISON: Portable chest yesterday at 02:19 PM. CLINICAL HISTORY: [id number] Pneumothorax [id number] Pneumothorax FINDINGS: LUNGS AND PLEURA: Minimal left apical pneumothorax. Linear atelectasis and patchy airspace disease appears similar in the left lower lobe. Minimal pleural effusions. The lungs are otherwise clear. No new abnormality. HEART AND MEDIASTINUM: Stable cardiomegaly. No vascular congestion is seen. TAVR again noted with aortic atherosclerosis. BONES AND SOFT TISSUES: No acute osseous abnormality. Osteopenia and kyphosis of the thoracic spine. IMPRESSION: 1. Minimal left apical pneumothorax, unchanged from prior study. 2. Linear atelectasis and patchy airspace disease in the left lower lobe with minimal pleural effusions, similar to the prior study. 3. Stable cardiomegaly  without vascular congestion. TAVR and aortic atherosclerosis present. Electronically signed by: Francis Quam MD 10/07/2024 06:26 AM EST RP Workstation: [id number]   DG Chest Port 1 View Result Date: 10/06/2024 CLINICAL DATA:  [id number] Pneumothorax [id number] EXAM: PORTABLE CHEST 1 VIEW COMPARISON:  October 06, 2024 FINDINGS: The cardiomediastinal silhouette is unchanged in contour.Status removal of a LEFT-sided chest tube. No pleural effusion. Similar trace residual LEFT apical pneumothorax. LEFT basilar platelike opacities, likely atelectasis. Status post TAVR. IMPRESSION: Similar trace residual LEFT apical pneumothorax status post chest tube removal. Electronically Signed   By: Corean Salter M.D.   On: 10/06/2024 18:15   DG Chest Port 1 View Result Date: 10/06/2024 CLINICAL DATA:  Status post left lung partial lobectomy. Follow-up left pneumothorax. EXAM: PORTABLE CHEST 1 VIEW COMPARISON:  10/05/2024 FINDINGS: A left chest tube remains in place with its tip at the left lung apex. Small residual left apical pneumothorax, decreased in size. No significant change in left basilar atelectasis and possible pulmonary hemorrhage. Stable surgical staples in the left mid lung zone. Decreased inspiration  with interval minimal right basilar atelectasis. Normal-sized heart. Stable TAVR. Unremarkable bones. IMPRESSION: 1. Small residual left apical pneumothorax, decreased in size. 2. No significant change in left basilar atelectasis and possible pulmonary hemorrhage. 3. Decreased inspiration with interval minimal right basilar atelectasis. Electronically Signed   By: Elspeth Bathe M.D.   On: 10/06/2024 10:59   DG Chest Port 1 View Result Date: 10/05/2024 EXAM: 1 VIEW(S) XRAY OF THE CHEST 10/05/2024 12:26:00 PM COMPARISON: PA and lateral preoperative chest films 10/03/2024. CLINICAL HISTORY: S/P partial left lower lobectomy for suspicious nodule in the superior segment. FINDINGS: LINES, TUBES AND DEVICES: There is a  left chest tube in place with the tip in the apex. LUNGS AND PLEURA: Postoperative left apicolateral pneumothorax with 2.5 cm apical pleuroparenchymal separation. Estimated to be approximately 10% of the left chest volume. There are new postsurgical changes in the left lower lobe with atelectatic bands in the lower lung field and streaky infrahilar opacity, which is probably due to postoperative limited lung hemorrhage. No other focal infiltrate is seen. Attention on follow-up recommended. The lungs are mildly emphysematous, otherwise clear with a fiducial marker again noted right mid field. No pleural effusion. HEART AND MEDIASTINUM: There is mild cardiomegaly. No vascular congestion is seen. TAVR again noted with stable mediastinal configuration. BONES AND SOFT TISSUES: No new osseous finding. IMPRESSION: 1. Postoperative left apicolateral pneumothorax with 2.5 cm apical pleuroparenchymal separation and left apical chest tube in place. 2. Postsurgical changes in the left lower lobe with atelectatic bands and probable limited infrahilar lung hemorrhage. Attention on follow-up recommended. 3. Mild cardiomegaly without vascular congestion. 4. These results will be telephoned to the referring provider or the referring providers representative by professional radiology assistant [facility]) personnel, with communication documented in the [facility] dashboard. Electronically signed by: Francis Quam MD 10/05/2024 09:07 PM EST RP Workstation: [id number]   DG Chest 2 View Result Date: 10/03/2024 CLINICAL DATA:  Preop chest exam. Primary adenocarcinoma of lower lobe of left lung. EXAM: CHEST - 2 VIEW COMPARISON:  Chest radiograph 08/21/2024.  PET CT 09/04/2024 FINDINGS: Stable heart size and mediastinal contours. TAVR. Fiducial markers in the left upper lung zone and right mid lung. No evidence of acute airspace disease. Mild biapical pleuroparenchymal scarring. No pulmonary edema, pleural effusion, or pneumothorax. No acute  osseous findings. IMPRESSION: 1. No acute chest findings. 2. Fiducial markers in the left upper lung zone and right mid lung. Electronically Signed   By: Andrea Gasman M.D.   On: 10/03/2024 16:22    ASSESSMENT AND PLAN: This is a very pleasant 67 years old white female with Bilateral stage IA (T1b, N0, M0) non-small cell lung cancer involving the superior segment of the left lower lobe as well as right upper lobe adenocarcinoma diagnosed in October 2025.  She is status post Xi robotic assisted left lower lobe superior segmentectomy, Lymph node sampling under the care of Dr. Kerrin on October 05, 2024.  The tumor size was 1.7 cm. Assessment and Plan Assessment & Plan Bilateral stage IA lung adenocarcinoma Status post left lower lobe segmentectomy with lymph node sampling for a 1.7 cm tumor, now two weeks post-operative with expected localized pain due to muscular and scar tissue healing. The right upper lobe nodule measures 1.0 x 0.8 cm, consistent with stage IA disease. Management of the right-sided lesion is pending further imaging and surgical recovery. Surgical resection remains preferred if she is an appropriate candidate; radiation therapy is an alternative if surgery is not feasible. Radiation is less invasive  but may result in residual scarring, complicating future imaging and disease assessment. - Reviewed post-operative recovery and pain management. - Confirmed PET scan of the chest scheduled in one week to evaluate the right upper lobe nodule. - Discussed management options for the right-sided lesion: segmentectomy preferred if she is a good surgical candidate; radiation therapy as an alternative if not. - Explained that radiation may leave residual scarring, complicating future imaging interpretation. - Scheduled follow-up in 5-6 weeks to reassess recovery and review imaging results. - Will refer to radiation oncology if surgical management is not pursued for the right-sided  lesion. The patient was advised to call immediately if she has any other concerning symptoms in the interval. The patient voices understanding of current disease status and treatment options and is in agreement with the current care plan.  All questions were answered. The patient knows to call the clinic with any problems, questions or concerns. We can certainly see the patient much sooner if necessary.  The total time spent in the appointment was 30 minutes including review of chart and various tests results, discussions about plan of care and coordination of care plan .  Disclaimer: This note was dictated with voice recognition software. Similar sounding words can inadvertently be transcribed and may not be corrected upon review.        "

## 2024-10-29 ENCOUNTER — Ambulatory Visit (HOSPITAL_COMMUNITY)
Admission: RE | Admit: 2024-10-29 | Discharge: 2024-10-29 | Disposition: A | Discharge status: Home / Self Care | Source: Ambulatory Visit | Attending: Thoracic Surgery (Cardiothoracic Vascular Surgery) | Admitting: Thoracic Surgery (Cardiothoracic Vascular Surgery)

## 2024-10-29 DIAGNOSIS — Z09 Encounter for follow-up examination after completed treatment for conditions other than malignant neoplasm: Secondary | ICD-10-CM | POA: Insufficient documentation

## 2024-10-29 DIAGNOSIS — C3432 Malignant neoplasm of lower lobe, left bronchus or lung: Secondary | ICD-10-CM | POA: Diagnosis present

## 2024-11-01 ENCOUNTER — Other Ambulatory Visit: Payer: Self-pay

## 2024-11-02 NOTE — Progress Notes (Signed)
 The proposed treatment discussed in conference is for discussion purpose only and is not a binding recommendation.  The patients have not been physically examined, or presented with their treatment options.  Therefore, final treatment plans cannot be decided.

## 2024-11-20 ENCOUNTER — Ambulatory Visit
Payer: Self-pay | Attending: Thoracic Surgery (Cardiothoracic Vascular Surgery) | Admitting: Thoracic Surgery (Cardiothoracic Vascular Surgery)

## 2024-11-20 ENCOUNTER — Encounter: Payer: Self-pay | Admitting: Thoracic Surgery (Cardiothoracic Vascular Surgery)

## 2024-11-20 VITALS — BP 108/70 | HR 88 | Resp 20 | Ht 66.0 in | Wt 173.8 lb

## 2024-11-20 DIAGNOSIS — Z9889 Other specified postprocedural states: Secondary | ICD-10-CM

## 2024-11-20 NOTE — Progress Notes (Unsigned)
 "  [address], [place] [address]             [phone]     HPI: [name] returns for a scheduled follow-up visit after recent surgery.  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   She had a CT of the chest because of her smoking history.  She was noted to have numerous pulmonary nodules which were followed.  Recent scan showed an increase in size and solid component of a left lower lobe nodule.  There also was a new suspicious right upper lobe nodule.  On PET the left lower lobe nodule was hypermetabolic, the right upper lobe nodule was too small to characterize.  Robotic bronchoscopy was performed by Dr. Shelah.  Both the left lower lobe and right upper lobe nodules were positive for adenocarcinoma.   I did a robotic assisted left lower lobe superior segmentectomy on 10/05/2024.  She went home on postoperative day #2.  I saw him in the office on 10/23/2024.  She was still having a lot of pain at that time.  Started her on Robaxin  which helped significantly.  She feels much better overall.  Still has difficulty sleeping.  Says she wakes up around 3:30 in the morning and has trouble getting back to sleep.  Still does not have her normal energy levels.  Past Medical History:  Diagnosis Date   Diverticulitis, colon    GERD (gastroesophageal reflux disease)    H/O colonoscopy 07/19/2022   Heart murmur    s/p TAVR 2021   Hypertension    Mild dilation of ascending aorta    Pulmonary nodules    seen on pre TAVR CT, needs follow up    Renal cancer [facility]) 2007   Surgically removed from right kidney   S/P TAVR (transcatheter aortic valve replacement)    s/p TAVR w/ a Edwards Sapien 3 Ultra THV via the TF approach on 11/13/19 with Dr.  Wonda and Lucas.    Severe aortic stenosis    Stroke (cerebrum) (HCC)    a. MRI of brain 12/2021 RI showed punctate subacute versus chronic white matter lacunar infarct in the posterior left corona radiata/centrum semiovale.    Current Outpatient Medications  Medication Sig Dispense Refill   aspirin  EC 81 MG tablet Take 1 tablet (81 mg total) by mouth daily. Swallow whole. 90 tablet 3   atorvastatin  (LIPITOR) 40 MG tablet TAKE 1 TABLET BY MOUTH DAILY 90 tablet 2   cetirizine (ZYRTEC) 10 MG tablet Take 10 mg by mouth daily.     fluticasone  (FLONASE ) 50 MCG/ACT nasal spray Place 1 spray into both nostrils as needed for allergies or rhinitis.     gabapentin  (NEURONTIN ) 300 MG capsule Take 1 capsule (300 mg total) by mouth 2 (two) times daily. 60 capsule 1   ibuprofen (ADVIL) 200 MG tablet Take 200 mg by mouth every 4 (four) hours as needed for moderate pain (pain score 4-6).     magnesium  oxide (MAG-OX) 400 MG tablet Take 1 tablet (400 mg total) by mouth daily. 30 tablet 11   methocarbamol  (ROBAXIN ) 500 MG tablet Take 1 tablet (500 mg total) by mouth every 8 (eight) hours as needed for muscle spasms. 30 tablet 2   metoprolol  succinate (TOPROL -XL)  50 MG 24 hr tablet TAKE 1 TABLET BY MOUTH DAILY WITH OR IMMEDIATELY FOLLOWING A MEAL 90 tablet 2   omeprazole  (PRILOSEC) 40 MG capsule TAKE 1 CAPSULE BY MOUTH DAILY 30 capsule 1   oxyCODONE  (OXY IR/ROXICODONE ) 5 MG immediate release tablet Take 1 tablet (5 mg total) by mouth every 6 (six) hours as needed for severe pain (pain score 7-10). 28 tablet 0   No current facility-administered medications for this visit.    Physical Exam BP 108/70 (BP Location: Left Arm, Patient Position: Sitting, Cuff Size: Normal)   Pulse 88   Resp 20   Ht 5' 6 (1.676 m)   Wt 173 lb 12.8 oz (78.8 kg)   SpO2 97% Comment: RA  BMI 28.62 kg/m  69 year old woman in no acute distress Alert and oriented x 3 with no focal deficits Well-developed well-nourished No cervical  or supraclavicular adenopathy Lungs clear with equal breath sounds bilaterally Cardiac regular rate and rhythm normal S1 and S2  Diagnostic Tests: CT CHEST WITHOUT CONTRAST   TECHNIQUE: Multidetector CT imaging of the chest was performed following the standard protocol without IV contrast.   RADIATION DOSE REDUCTION: This exam was performed according to the departmental dose-optimization program which includes automated exposure control, adjustment of the mA and/or kV according to patient size and/or use of iterative reconstruction technique.   COMPARISON:  Nuclear medicine PET dated 09/04/2024, CT chest dated 06/22/2024   FINDINGS: Cardiovascular: Normal heart size. No significant pericardial fluid/thickening. Great vessels are normal in course and caliber. Status post aortic valve replacement. Coronary artery calcifications and aortic atherosclerosis.   Mediastinum/Nodes: Imaged thyroid  gland without nodules meeting criteria for imaging follow-up by size. Normal esophagus. No pathologically enlarged axillary, supraclavicular, mediastinal, or hilar lymph nodes.   Lungs/Pleura: The central airways are patent. Postsurgical changes of superior segment left lower lobe. Irregular consolidation along the surgical suture. Mild left pleural thickening. 11 x 10 mm ground-glass nodule in the inferolateral right lower lobe with adjacent metallic clip (302:57), unchanged from 06/22/2024. Multiple additional bilateral ground-glass nodules, the majority of which are unchanged from 06/22/2024, for example 7 mm right upper lobe (302:33). At least 1 of the previously noted left upper lobe ground-glass nodules is no longer seen while a posterior left upper lobe nodule measuring 6 mm (302:20), may be new. No pneumothorax.   Upper abdomen: Normal.   Musculoskeletal: No acute or abnormal lytic or blastic osseous lesions. Multilevel degenerative changes of the thoracic spine.    IMPRESSION: 1. Postsurgical changes of superior segment left lower lobe. 2. Multiple additional bilateral ground-glass nodules, the majority of which are unchanged from 06/22/2024. At least 1 of the previously noted left upper lobe ground-glass nodules is no longer seen while a posterior left upper lobe nodule measuring 6 mm may be new an infectious/inflammatory. 3. Aortic Atherosclerosis (ICD10-I70.0). Coronary artery calcifications. Assessment for potential risk factor modification, dietary therapy or pharmacologic therapy may be warranted, if clinically indicated.     Electronically Signed   By: Limin  Xu M.D.   On: 10/29/2024 15:57 I personally reviewed the CT images.  No change in the right upper lobe nodule with adjacent fiducial.  Status post left lower lobe superior segmentectomy.  Multiple additional ground glass nodules bilaterally.  Stable aortic and coronary calcification.   Impression: [name]  is a 69 year old woman with a history of tobacco abuse, aortic stenosis, TAVR, renal cell carcinoma, cerebellar stroke, reflux, hypertension, pulmonary nodules, and newly diagnosed synchronous stage Ia adenocarcinomas of the left  lower lobe and right upper lobe.  Status post left lower lobe superior segmentectomy-she is now about 6 weeks out from surgery.  She looks great.  Pain has dramatically improved with Robaxin .  Overall energy levels are back to normal, likely related to sleep disturbance more than anything else.  Adenocarcinoma right upper lobe-stage Ia (T1, N0).  She has yet to decide how she would like to proceed with treatment of that nodule.  We discussed the options of surgical resection versus stereotactic radiation.  From a surgical standpoint would be similar to her previous operation, so she is familiar with the nature of the surgery and the risks and benefits.  She is definitely a candidate for the procedure should she decide to proceed.  She does have  multiple other ground glass opacities bilaterally.  Any or all of these could be adenocarcinoma in situ.  They could progress at some point in the future and she may need additional treatments.  She will think over her options and let us  know whether she wants to pursue surgery or will opt for radiation of the right upper lobe nodule.  Plan: She will call and let us  know if she would like to proceed with robotic right upper lobe wedge resection or segmentectomy.  Tiffany JAYSON Millers, MD [facility] [phone]     "

## 2024-12-03 ENCOUNTER — Inpatient Hospital Stay: Admitting: Internal Medicine

## 2024-12-03 ENCOUNTER — Inpatient Hospital Stay: Attending: Internal Medicine
